# Patient Record
Sex: Female | Born: 1975 | Race: Black or African American | Hispanic: No | Marital: Single | Smoking: Current some day smoker
Health system: Southern US, Community
[De-identification: ages and names within clinical notes are randomized; demographics above are authoritative.]

## PROBLEM LIST (undated history)

## (undated) DIAGNOSIS — R87629 Unspecified abnormal cytological findings in specimens from vagina: Secondary | ICD-10-CM

## (undated) DIAGNOSIS — M3500 Sicca syndrome, unspecified: Secondary | ICD-10-CM

## (undated) DIAGNOSIS — N939 Abnormal uterine and vaginal bleeding, unspecified: Secondary | ICD-10-CM

## (undated) DIAGNOSIS — N390 Urinary tract infection, site not specified: Secondary | ICD-10-CM

## (undated) DIAGNOSIS — Z8619 Personal history of other infectious and parasitic diseases: Secondary | ICD-10-CM

## (undated) HISTORY — DX: Personal history of other infectious and parasitic diseases: Z86.19

## (undated) HISTORY — DX: Urinary tract infection, site not specified: N39.0

## (undated) HISTORY — DX: Unspecified abnormal cytological findings in specimens from vagina: R87.629

## (undated) HISTORY — PX: LEEP: SHX91

## (undated) HISTORY — DX: Sjogren syndrome, unspecified: M35.00

---

## 1997-12-04 ENCOUNTER — Inpatient Hospital Stay (HOSPITAL_COMMUNITY): Admission: AD | Admit: 1997-12-04 | Discharge: 1997-12-04 | Payer: Self-pay | Admitting: *Deleted

## 1997-12-06 ENCOUNTER — Inpatient Hospital Stay (HOSPITAL_COMMUNITY): Admission: AD | Admit: 1997-12-06 | Discharge: 1997-12-06 | Payer: Self-pay | Admitting: Obstetrics

## 1998-04-23 ENCOUNTER — Ambulatory Visit (HOSPITAL_COMMUNITY): Admission: RE | Admit: 1998-04-23 | Discharge: 1998-04-23 | Payer: Self-pay | Admitting: *Deleted

## 1998-06-24 ENCOUNTER — Inpatient Hospital Stay (HOSPITAL_COMMUNITY): Admission: AD | Admit: 1998-06-24 | Discharge: 1998-06-27 | Payer: Self-pay | Admitting: *Deleted

## 1999-04-22 ENCOUNTER — Emergency Department (HOSPITAL_COMMUNITY): Admission: EM | Admit: 1999-04-22 | Discharge: 1999-04-22 | Payer: Self-pay | Admitting: Emergency Medicine

## 2002-07-23 ENCOUNTER — Inpatient Hospital Stay (HOSPITAL_COMMUNITY): Admission: AD | Admit: 2002-07-23 | Discharge: 2002-07-23 | Payer: Self-pay | Admitting: *Deleted

## 2002-08-18 ENCOUNTER — Encounter: Payer: Self-pay | Admitting: *Deleted

## 2002-08-18 ENCOUNTER — Ambulatory Visit (HOSPITAL_COMMUNITY): Admission: RE | Admit: 2002-08-18 | Discharge: 2002-08-18 | Payer: Self-pay | Admitting: *Deleted

## 2002-08-24 ENCOUNTER — Ambulatory Visit (HOSPITAL_COMMUNITY): Admission: RE | Admit: 2002-08-24 | Discharge: 2002-08-24 | Payer: Self-pay | Admitting: *Deleted

## 2002-08-24 ENCOUNTER — Encounter: Payer: Self-pay | Admitting: *Deleted

## 2002-11-07 ENCOUNTER — Ambulatory Visit (HOSPITAL_COMMUNITY): Admission: RE | Admit: 2002-11-07 | Discharge: 2002-11-07 | Payer: Self-pay | Admitting: *Deleted

## 2002-11-28 ENCOUNTER — Ambulatory Visit (HOSPITAL_COMMUNITY): Admission: RE | Admit: 2002-11-28 | Discharge: 2002-11-28 | Payer: Self-pay | Admitting: *Deleted

## 2003-01-13 ENCOUNTER — Inpatient Hospital Stay (HOSPITAL_COMMUNITY): Admission: AD | Admit: 2003-01-13 | Discharge: 2003-01-16 | Payer: Self-pay | Admitting: Obstetrics and Gynecology

## 2005-08-04 ENCOUNTER — Inpatient Hospital Stay (HOSPITAL_COMMUNITY): Admission: AD | Admit: 2005-08-04 | Discharge: 2005-08-04 | Payer: Self-pay | Admitting: Family Medicine

## 2006-01-19 ENCOUNTER — Encounter: Payer: Self-pay | Admitting: Emergency Medicine

## 2006-01-19 ENCOUNTER — Ambulatory Visit (HOSPITAL_COMMUNITY): Admission: AD | Admit: 2006-01-19 | Discharge: 2006-01-19 | Payer: Self-pay | Admitting: Obstetrics and Gynecology

## 2006-01-19 ENCOUNTER — Encounter (INDEPENDENT_AMBULATORY_CARE_PROVIDER_SITE_OTHER): Payer: Self-pay | Admitting: *Deleted

## 2006-06-24 ENCOUNTER — Ambulatory Visit (HOSPITAL_COMMUNITY): Admission: RE | Admit: 2006-06-24 | Discharge: 2006-06-24 | Payer: Self-pay | Admitting: Family Medicine

## 2006-11-22 ENCOUNTER — Ambulatory Visit: Payer: Self-pay | Admitting: Family Medicine

## 2006-11-22 ENCOUNTER — Inpatient Hospital Stay (HOSPITAL_COMMUNITY): Admission: AD | Admit: 2006-11-22 | Discharge: 2006-11-24 | Payer: Self-pay | Admitting: Family Medicine

## 2006-11-22 ENCOUNTER — Encounter: Payer: Self-pay | Admitting: Family Medicine

## 2009-03-13 ENCOUNTER — Inpatient Hospital Stay (HOSPITAL_COMMUNITY): Admission: AD | Admit: 2009-03-13 | Discharge: 2009-03-13 | Payer: Self-pay | Admitting: Obstetrics & Gynecology

## 2009-03-13 ENCOUNTER — Encounter: Payer: Self-pay | Admitting: Emergency Medicine

## 2010-08-13 IMAGING — US US OB TRANSVAGINAL
1 series · 14 of 28 positions shown · non-contrast
Comparison: None

CLINICAL DATA: Pelvic pain and vaginal bleeding.

OBSTETRIC <14 WK US AND TRANSVAGINAL OB US
TECHNIQUE: Both transabdominal and transvaginal ultrasound
examinations were performed for complete evaluation of the
gestation as well as the maternal uterus, adnexal regions, and
pelvic cul-de-sac.

[Series 1: us ob transvaginal · 0.26mm/px · 14 of 37 slices shown]
[im 2/37]
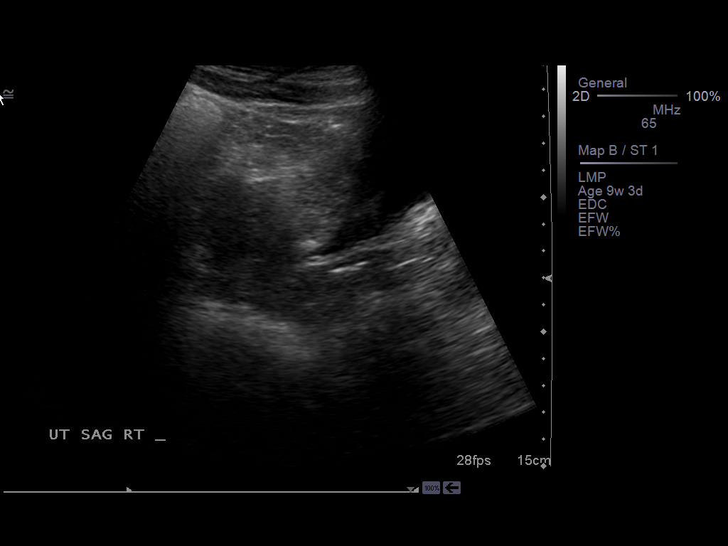
[im 5/37]
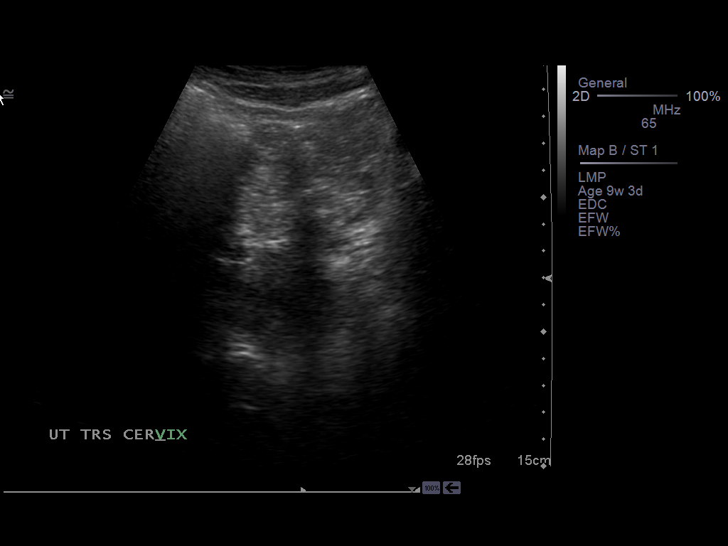
[im 7/37]
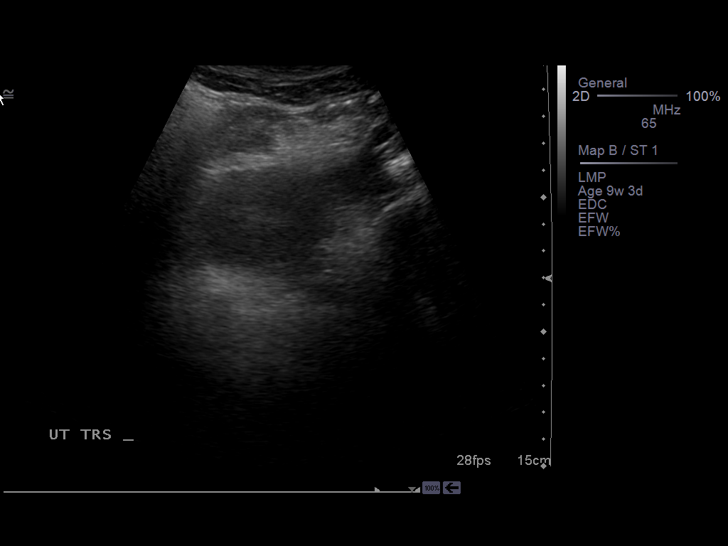
[im 10/37]
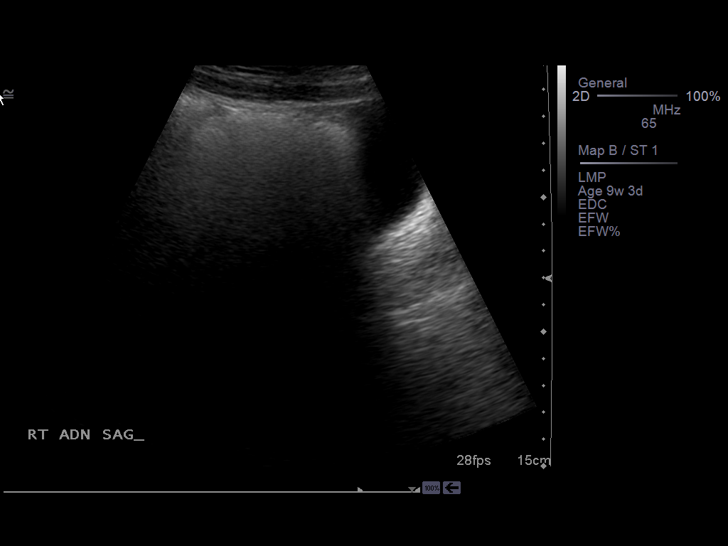
[im 13/37]
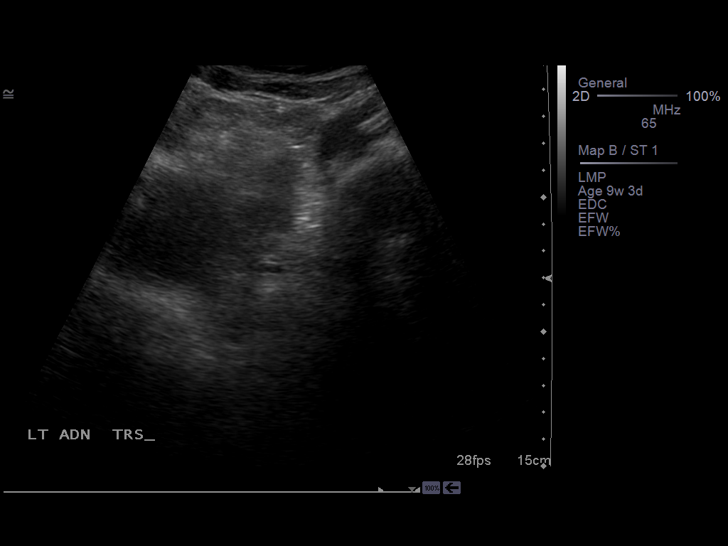
[im 15/37]
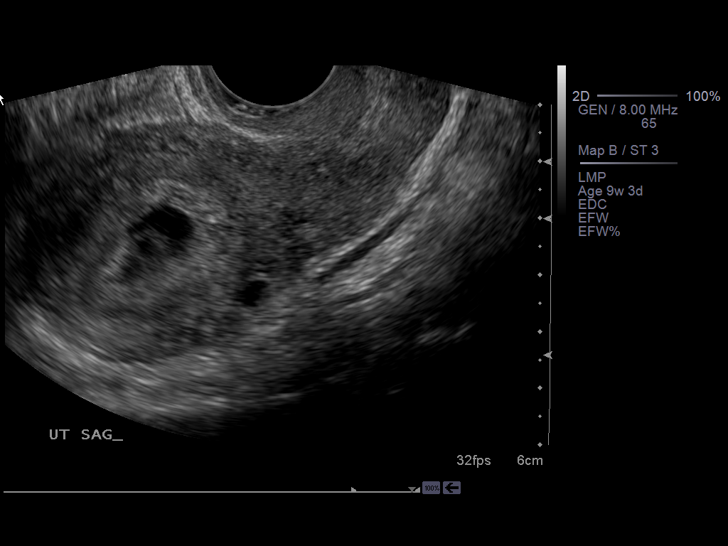
[im 18/37]
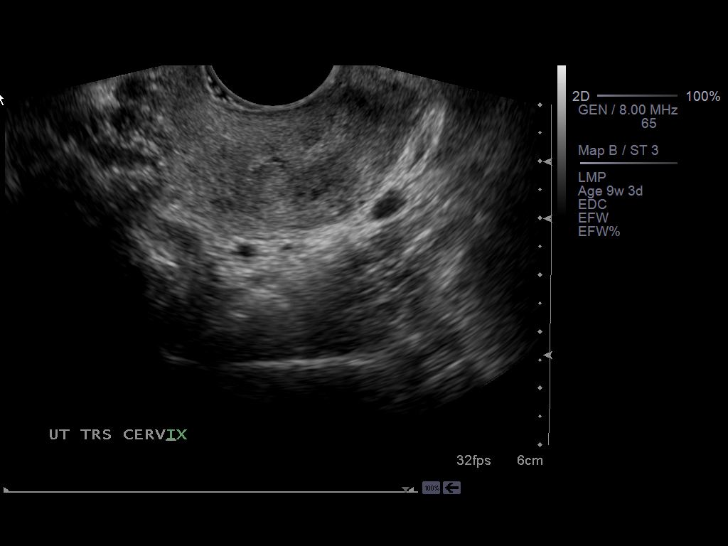
[im 21/37]
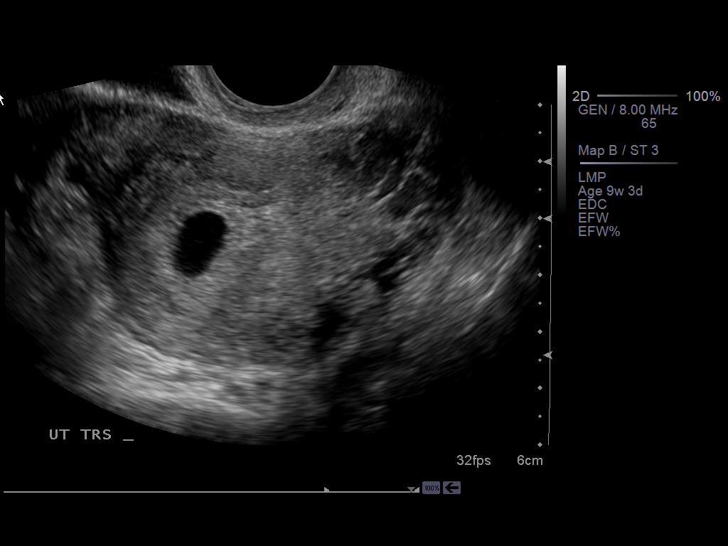
[im 23/37]
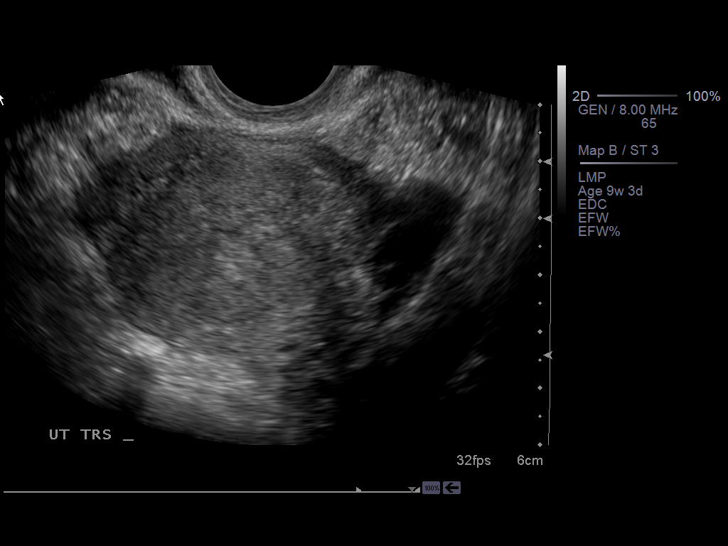
[im 26/37]
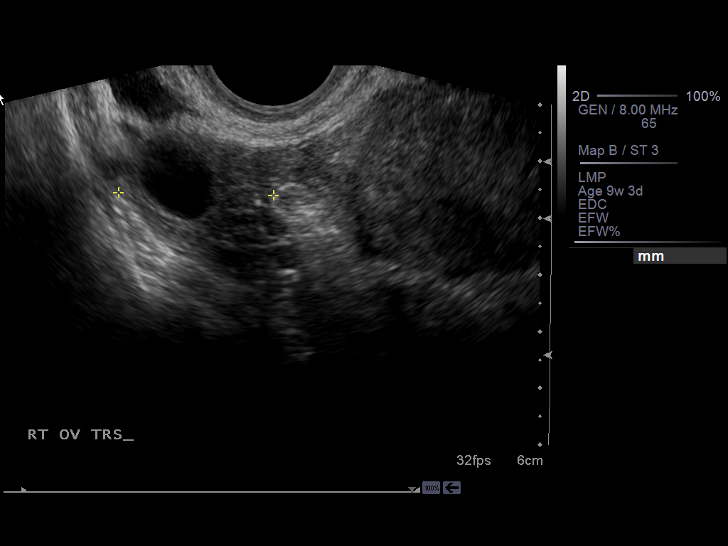
[im 29/37]
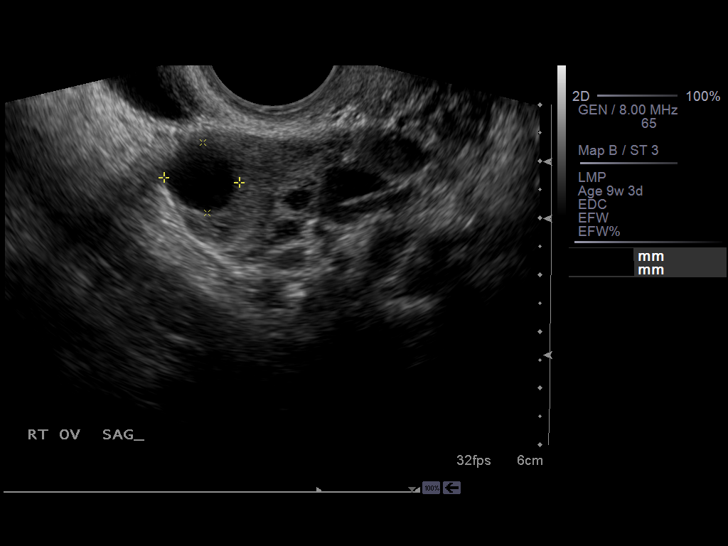
[im 31/37]
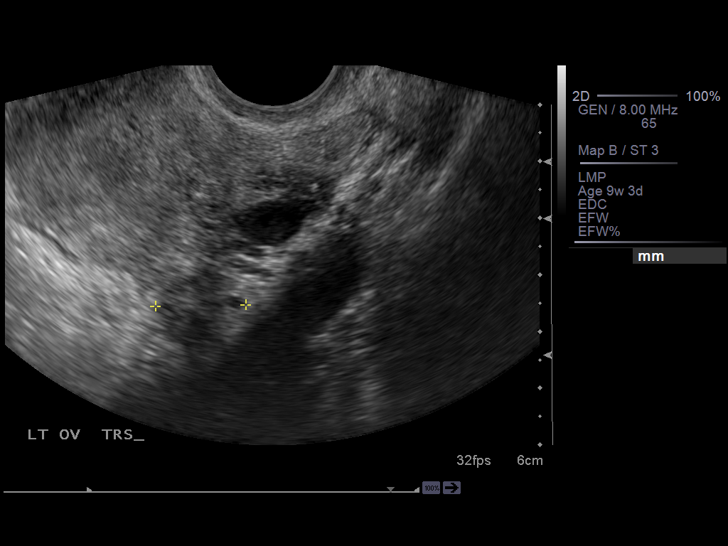
[im 34/37]
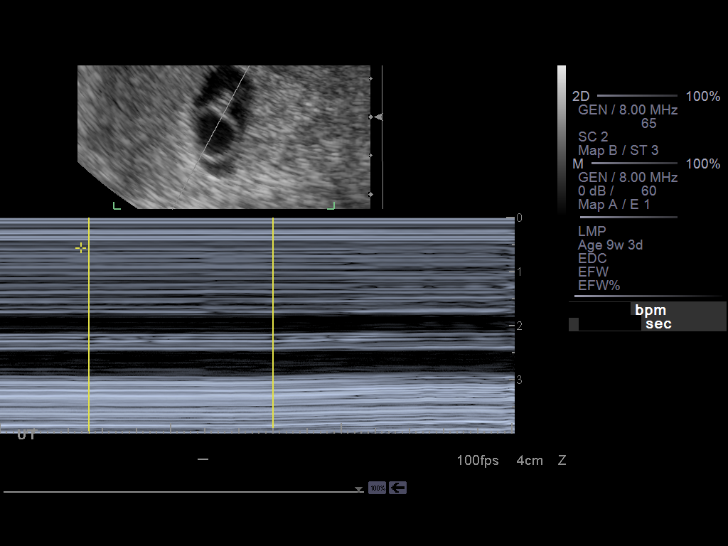
[im 37/37]
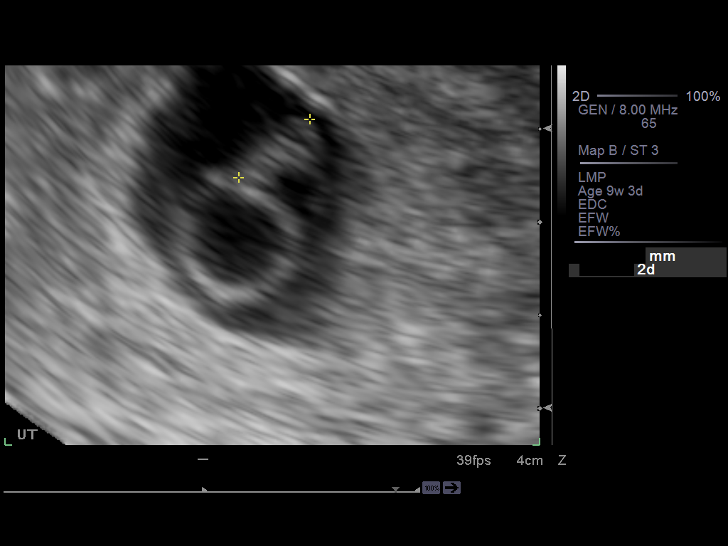

[14 of 28 positions shown; findings below may reference images not displayed]

Intrauterine gestational sac: Single
Yolk sac: Present
Embryo: Present
Cardiac Activity: Present
Heart Rate: 59 bpm

CRL: 4.9mm           6   w  2   d

Maternal uterus/adnexae:
No subchorionic hemorrhage.
Small cyst associated the right ovary.
Left ovary is normal.
No free pelvic fluid.
IMPRESSION: Intrauterine gestational sac with an embryo estimated at 6 weeks
and 2 days gestation.  Low heart rate is worrisome. Follow-up
quantitative beta HCG levels are suggested along with reultrasound
examination as indicated.

## 2010-10-11 LAB — GC/CHLAMYDIA PROBE AMP, GENITAL
Chlamydia, DNA Probe: NEGATIVE
GC Probe Amp, Genital: NEGATIVE

## 2010-10-11 LAB — URINALYSIS, ROUTINE W REFLEX MICROSCOPIC
Bilirubin Urine: NEGATIVE
Glucose, UA: NEGATIVE mg/dL
Ketones, ur: NEGATIVE mg/dL
Leukocytes, UA: NEGATIVE
Nitrite: NEGATIVE
Protein, ur: NEGATIVE mg/dL
Specific Gravity, Urine: 1.013 (ref 1.005–1.030)
Urobilinogen, UA: 0.2 mg/dL (ref 0.0–1.0)
pH: 7 (ref 5.0–8.0)

## 2010-10-11 LAB — URINE MICROSCOPIC-ADD ON

## 2010-10-11 LAB — SAMPLE TO BLOOD BANK

## 2010-10-11 LAB — HCG, QUANTITATIVE, PREGNANCY: hCG, Beta Chain, Quant, S: 21274 m[IU]/mL — ABNORMAL HIGH (ref <5)

## 2010-10-11 LAB — BASIC METABOLIC PANEL
BUN: 10 mg/dL (ref 6–23)
CO2: 27 mEq/L (ref 19–32)
Chloride: 106 mEq/L (ref 96–112)
Creatinine, Ser: 0.57 mg/dL (ref 0.4–1.2)
Glucose, Bld: 93 mg/dL (ref 70–99)

## 2010-10-11 LAB — POCT I-STAT, CHEM 8
Creatinine, Ser: 0.8 mg/dL (ref 0.4–1.2)
Glucose, Bld: 89 mg/dL (ref 70–99)
Hemoglobin: 12.9 g/dL (ref 12.0–15.0)
Potassium: 3.9 mEq/L (ref 3.5–5.1)

## 2010-10-11 LAB — CBC
MCHC: 34.1 g/dL (ref 30.0–36.0)
MCV: 95 fL (ref 78.0–100.0)
Platelets: 222 10*3/uL (ref 150–400)

## 2010-10-11 LAB — DIFFERENTIAL
Basophils Relative: 1 % (ref 0–1)
Eosinophils Absolute: 0.1 10*3/uL (ref 0.0–0.7)
Monocytes Relative: 12 % (ref 3–12)
Neutrophils Relative %: 40 % — ABNORMAL LOW (ref 43–77)

## 2010-10-11 LAB — WET PREP, GENITAL: Clue Cells Wet Prep HPF POC: NONE SEEN

## 2010-11-19 NOTE — Discharge Summary (Signed)
[name], [name]     ACCOUNT NO.:  [id number]   MEDICAL RECORD NO.:  [id number]          PATIENT TYPE:  INP   LOCATION:  9130                          FACILITY:  [facility]   PHYSICIAN:  Norton Blizzard, M.D.    DATE OF BIRTH:  1976/07/06   DATE OF ADMISSION:  11/22/2006  DATE OF DISCHARGE:  11/24/2006                               DISCHARGE SUMMARY   DISCHARGE DIAGNOSIS:  Status post primary low transverse C-section of a  viable female infant - C-section was for terminal fetal bradycardia for 5  minutes at 80 beats per minute then decreased down to the 50s.   CONSULTS:  None.   PROCEDURE:  1. Fetal lung stress test.  2. Ultrasound.  3. Epidural.  4. Primary low transverse C-section as noted above.   HOSPITAL COURSE:  The patient is a 35 year old, G5, P2, 0, 2, 2, who  presented in active labor at 40 plus 5 weeks.  She was augmented with  artifical rupture of membranes with clear fluid.  She had an epidural  for anesthesia.  At about 9:00 to 10:00 a.m. on the day of admission  fetal heart rate was noted to be decreasing down to 80s to 60s to 50s.  Noted to have fetal terminal bradycardia so was taken to the OR for an  emergent primary low transverse C-section.  Apgar's of infant were 9 and  10 with a cord pH of 7.15.  Estimated blood loss of the procedure is  1000 mL.  NICU team was present.   During the postpartum period the patient was noted to have some  sanguineous drainage from the middle part of her incision.  This was not  noted to be fluctuant as if there was a hematoma in this region.  She  had no active drainage with palpation around the area and the wound was  intact.  Her hemoglobin had decreased to 9.9 from 11.5 postop; however,  the bleeding was decreasing.  She had no dizziness.  It was felt that  she could be discharged with the staples in place to have the [facility]  nurse take them out at home.  She is breast and bottle feeding.  Noted  to be rh positive,  rubella immune, and GBS negative.  She desired  Micronor for contraception.  She did not want a circumcision for her  female infant.  She had no other complaints in the postpartum period and  it was felt that she could be discharged on postop day #2 for early  discharge.  She is to followup at the health department in 6 weeks for  her regular postpartum check.           ______________________________  Norton Blizzard, M.D.     SH/MEDQ  D:  11/24/2006  T:  11/24/2006  Job:  [id number]   cc:   Health Department

## 2010-11-19 NOTE — Op Note (Signed)
[name], [name]     ACCOUNT NO.:  [id number]   MEDICAL RECORD NO.:  [id number]          PATIENT TYPE:  INP   LOCATION:  9130                          [facility]ACILITY:  [facility]   PHYSICIAN:  Tanya S. Shawnie Pons, M.D.   DATE OF BIRTH:  11-02-1975   DATE OF PROCEDURE:  11/22/2006  DATE OF DISCHARGE:                               OPERATIVE REPORT   PREOPERATIVE DIAGNOSIS:  Fetal bradycardia.   POSTOPERATIVE DIAGNOSIS:  Fetal bradycardia.   PROCEDURE:  Emergent primary low transverse C-section.   SURGEON:  Shelbie Proctor. Shawnie Pons, M.D.   ANESTHESIA:  Epidural, Casimiro Needle A. Malen Gauze, M.D.   FINDINGS:  Viable female infant, Apgars 9 and 10, weight 9 pounds 5  ounces.  Infant in an ROP to ROT position.   ESTIMATED BLOOD LOSS:  1000 cubic centimeters.   COMPLICATIONS:  None.   SPECIMENS:  Placenta to pathology, cord pH 7.15.   REASON FOR PROCEDURE:  Briefly, the patient is a 35 year old, gravida 5  para 2-0-2-2, who had two previous vaginal deliveries and came in at 40  and 5 at 7 cm, complete, __________ , with a bulging bag of membranes.  The patient continued to labor through the afternoon and finally became  complete.  At that point, she began pushing and was noted to have a  fairly extensive deceleration and I was called to room.  Upon entering  the room, the patient was noted to have fetal bradycardia in the 80s for  approximately five minutes.  She was examined, felt to be complete, ROP  to ROT, 0 station.  We attempted to push x 3 and to even rotate the baby  with no effective expulsive effort.  At this point, the fetal heart rate  was in the 60s and going down to the 50s and had been down for  approximately 12 minutes.  At that point, emergent C-section was called.   DESCRIPTION OF PROCEDURE:  The patient was taken to the OR where she was  placed in the supine position.  Betadine was put on her abdomen and the  drapes placed.  Anesthesia was tested with an Allis clamp and she was  felt  to have adequate anesthesia, so a knife was used to make a  Pfannenstiel incision in the skin.  This was carried down to underlying  fascia.  The fascia was divided bluntly laterally.  The rectus was  divided in the midline, the peritoneal cavity entered bluntly.  A knife  was used to make a low transverse incision on the uterus until the  amniotic cavity was entered.  The infant was ROP and head was brought up  through the incision and delivered easily.  The cord was clamped x 2 and  cut, and the infant was taken to the awaiting pediatrician.  Cord pH and  cord blood were obtained.  The placenta was manually removed from the  uterus, the uterine cavity cleaned with a dry lap pad.  The edges of the  uterine incision was grasped with ring forceps.  The uterine incision  was closed with a 0-Vicryl suture in a locked running fashion.  A second  imbricating layer  of 0-Vicryl was used to achieve hemostasis.  There was  some bleeding around the bladder.  This was cauterized with  electrocautery.  The rectus was inspected and found to be hemostatic.  The fascia was closed with a 0-Vicryl suture in a running fashion.  The  subcutaneous tissue was irrigated and any  bleeding was cauterized with electrocautery.  The skin was closed using  clips.  A pressure dressing was applied.  The patient was awakened and  taken to the recovery room in stable condition.  All instrument and lap  counts were correct x 2.           ______________________________  Shelbie Proctor. Shawnie Pons, M.D.     TSP/MEDQ  D:  11/22/2006  T:  11/23/2006  Job:  [id number]

## 2010-11-22 NOTE — Op Note (Signed)
[name], [name] NO.:  [id number]   MEDICAL RECORD NO.:  [id number]          PATIENT TYPE:  AMB   LOCATION:  SDC                           [facility]ACILITY:  [facility]   PHYSICIAN:  Malva Limes, M.D.    DATE OF BIRTH:  [birth year]   DATE OF PROCEDURE:  01/19/2006  DATE OF DISCHARGE:                                 OPERATIVE REPORT   PREOPERATIVE DIAGNOSIS:  Incomplete abortion.   POSTOPERATIVE DIAGNOSIS:  Incomplete abortion.   PROCEDURE:  Dilation, evacuation.   SURGEON:  Malva Limes, M.D.   ANESTHESIA:  MAC with paracervical block.   DRAINS:  None.   ANTIBIOTICS:  Ancef 1 gram.   SPECIMENS:  Products of conception sent to pathology.   COMPLICATIONS:  None.   ESTIMATED BLOOD LOSS:  Minimal.   PROCEDURE:  The patient was taken to the operating room where she was placed  in the dorsal lithotomy position.  The patient was given a MAC anesthesia.  She was prepped with Betadine and a sterile speculum was placed in the  vagina.  20 cubic centimeters of 1% lidocaine was placed in the cervix.  A  single-tooth tenaculum was applied to the anterior cervical lip.  The  patient had obvious products of conception in the dilator os.  This was  removed with a ring forceps.  No dilation was required for the suction  curettage.  A 9 mm suction curette was placed into the uterine cavity, and a  minimal amount of tissue remained.  Sharp curettage was performed followed  by repeat suction.  The patient was taken to the recovery room in stable  condition.  She will be discharged to home.  She will be instructed tofollow  up with the teaching service who is her physician.  She will be given Keflex  5 mg four times a day for 2 days and Anaprox double strength p.r.n.  She  will be given RhoGAM prior to discharge if her blood type is Rh negative.           ______________________________  Malva Limes, M.D.     MA/MEDQ  D:  01/19/2006  T:  01/20/2006  Job:  [id number]

## 2011-08-14 ENCOUNTER — Encounter (INDEPENDENT_AMBULATORY_CARE_PROVIDER_SITE_OTHER): Payer: Managed Care, Other (non HMO) | Admitting: Physician Assistant

## 2011-08-26 ENCOUNTER — Other Ambulatory Visit: Payer: Self-pay | Admitting: *Deleted

## 2011-08-26 MED ORDER — NORGESTIMATE-ETH ESTRADIOL 0.25-35 MG-MCG PO TABS: 1.0000 | ORAL_TABLET | Freq: Every day | ORAL | Status: DC

## 2011-11-26 ENCOUNTER — Ambulatory Visit (INDEPENDENT_AMBULATORY_CARE_PROVIDER_SITE_OTHER): Payer: Managed Care, Other (non HMO) | Admitting: Family Medicine

## 2011-11-26 VITALS — BP 118/58 | HR 88 | Temp 98.3°F | Resp 16 | Ht 62.0 in | Wt 135.8 lb

## 2011-11-26 DIAGNOSIS — Z0000 Encounter for general adult medical examination without abnormal findings: Secondary | ICD-10-CM

## 2011-11-26 DIAGNOSIS — IMO0001 Reserved for inherently not codable concepts without codable children: Secondary | ICD-10-CM

## 2011-11-26 MED ORDER — NORGESTIMATE-ETH ESTRADIOL 0.25-35 MG-MCG PO TABS: ORAL_TABLET | ORAL | Status: DC

## 2011-11-26 NOTE — Progress Notes (Signed)
Subjective:    Patient ID: [name], female    DOB: [birth year], 36 y.o.   MRN: [id number]  HPI Presents for annual CPE  Last menses 5/5 Married, monogomous 3 children  Yearly paps X 10 all normal   Review of Systems     Objective:   Physical Exam  Constitutional: She appears well-developed.  HENT:  Head: Normocephalic and atraumatic.  Right Ear: External ear normal.  Left Ear: External ear normal.  Nose: Nose normal.  Mouth/Throat: Oropharynx is clear and moist.  Eyes: EOM are normal. Pupils are equal, round, and reactive to light.  Neck: Normal range of motion. Neck supple.  Cardiovascular: Normal rate, regular rhythm and normal heart sounds.   Pulmonary/Chest: Effort normal and breath sounds normal. Right breast exhibits no mass, no nipple discharge and no skin change. Left breast exhibits no mass, no nipple discharge and no skin change.  Abdominal: Soft. Bowel sounds are normal.       No HSM  Musculoskeletal: Normal range of motion.  Neurological: She is alert. She has normal reflexes.  Skin: Skin is warm and dry.  Psychiatric: She has a normal mood and affect.       Assessment & Plan:  CPE      -Birth control  Given yearly paps all normal and current guidelines patient does not need a pap smear today Will check uriprobe and u preg Refill OCP X 1 year Antiicipatory guidance

## 2011-11-27 LAB — GC/CHLAMYDIA PROBE AMP, URINE: GC Probe Amp, Urine: NEGATIVE

## 2011-12-01 ENCOUNTER — Encounter: Payer: Self-pay | Admitting: *Deleted

## 2012-03-11 ENCOUNTER — Ambulatory Visit (INDEPENDENT_AMBULATORY_CARE_PROVIDER_SITE_OTHER): Payer: Managed Care, Other (non HMO) | Admitting: Physician Assistant

## 2012-03-11 VITALS — BP 110/68 | HR 106 | Temp 98.2°F | Resp 12 | Ht 62.0 in | Wt 139.6 lb

## 2012-03-11 DIAGNOSIS — R300 Dysuria: Secondary | ICD-10-CM

## 2012-03-11 DIAGNOSIS — B373 Candidiasis of vulva and vagina: Secondary | ICD-10-CM

## 2012-03-11 DIAGNOSIS — N898 Other specified noninflammatory disorders of vagina: Secondary | ICD-10-CM

## 2012-03-11 DIAGNOSIS — N760 Acute vaginitis: Secondary | ICD-10-CM

## 2012-03-11 LAB — POCT URINALYSIS DIPSTICK
Protein, UA: NEGATIVE
Spec Grav, UA: 1.02
Urobilinogen, UA: 1

## 2012-03-11 LAB — POCT WET PREP WITH KOH: KOH Prep POC: POSITIVE

## 2012-03-11 LAB — POCT UA - MICROSCOPIC ONLY
Casts, Ur, LPF, POC: NEGATIVE
Yeast, UA: NEGATIVE

## 2012-03-11 MED ORDER — AZITHROMYCIN 250 MG PO TABS: ORAL_TABLET | ORAL | Status: AC

## 2012-03-11 MED ORDER — METRONIDAZOLE 500 MG PO TABS: 500.0000 mg | ORAL_TABLET | Freq: Two times a day (BID) | ORAL | Status: AC

## 2012-03-11 MED ORDER — CEFIXIME 400 MG PO TABS: ORAL_TABLET | ORAL | Status: DC

## 2012-03-11 MED ORDER — FLUCONAZOLE 150 MG PO TABS: 150.0000 mg | ORAL_TABLET | Freq: Once | ORAL | Status: AC

## 2012-03-11 NOTE — Progress Notes (Signed)
Subjective:    Patient ID: [name], female    DOB: February 12, 1976, 36 y.o.   MRN: [id number]  HPI 36 yr old female presents with vaginal itching and dysuria X 2days.  She denies pelvic pain or abdominal pain.  No f/c.  She did travel to the beach recently and had sex with her partner frequently. She hasn't noticed vaginal discharge, but she has noticed a fishy odor.  She is up to date on her pap smear.  Review of Systems  All other systems reviewed and are negative.       Objective:   Physical Exam  Nursing note and vitals reviewed. Constitutional: She is oriented to person, place, and time. She appears well-developed and well-nourished.  Cardiovascular: Normal rate, regular rhythm and normal heart sounds.   Pulmonary/Chest: Effort normal and breath sounds normal.  Genitourinary:       Patient did a self-wetprep  Neurological: She is alert and oriented to person, place, and time.  Skin: Skin is warm and dry.  Psychiatric: She has a normal mood and affect. Her behavior is normal.      Results for orders placed in visit on 03/11/12  POCT UA - MICROSCOPIC ONLY      Component Value Range   WBC, Ur, HPF, POC 10-15     RBC, urine, microscopic 2-5     Bacteria, U Microscopic 1+     Mucus, UA trace     Epithelial cells, urine per micros 3-6     Crystals, Ur, HPF, POC neg     Casts, Ur, LPF, POC neg     Yeast, UA neg     Amorphous 2+    POCT URINALYSIS DIPSTICK      Component Value Range   Color, UA orange     Clarity, UA cloudy     Glucose, UA neg     Bilirubin, UA neg     Ketones, UA neg     Spec Grav, UA 1.020     Blood, UA trace     pH, UA 7.0     Protein, UA neg     Urobilinogen, UA 1.0     Nitrite, UA pos     Leukocytes, UA large (3+)    POCT WET PREP WITH KOH      Component Value Range   Trichomonas, UA Negative     Clue Cells Wet Prep HPF POC 2-4     Epithelial Wet Prep HPF POC 1-3     Yeast Wet Prep HPF POC positive     Bacteria Wet Prep HPF  POC 2+     RBC Wet Prep HPF POC 0-2     WBC Wet Prep HPF POC 8-12     KOH Prep POC Positive        Assessment & Plan:  BV Yeast vaginitis Cystitis-increase water intake.  Start prescriptions as directed. May need additional antibiotics for UTI, but we can await culture since she has to start all of these others now.

## 2012-03-12 NOTE — Progress Notes (Signed)
This encounter was created in error - please disregard.

## 2012-03-13 LAB — GC/CHLAMYDIA PROBE AMP, URINE: Chlamydia, Swab/Urine, PCR: NEGATIVE

## 2012-03-14 LAB — URINE CULTURE: Colony Count: 90000

## 2014-09-25 ENCOUNTER — Encounter (HOSPITAL_COMMUNITY): Payer: Self-pay | Admitting: *Deleted

## 2014-10-05 ENCOUNTER — Encounter (HOSPITAL_COMMUNITY): Payer: Self-pay

## 2014-10-05 ENCOUNTER — Ambulatory Visit (HOSPITAL_COMMUNITY)
Admission: RE | Admit: 2014-10-05 | Discharge: 2014-10-05 | Disposition: A | Discharge status: Home / Self Care | Payer: Self-pay | Source: Ambulatory Visit | Attending: Obstetrics and Gynecology | Admitting: Obstetrics and Gynecology

## 2014-10-05 VITALS — BP 112/76 | Temp 98.1°F | Ht 62.0 in | Wt 149.4 lb

## 2014-10-05 DIAGNOSIS — Z1239 Encounter for other screening for malignant neoplasm of breast: Secondary | ICD-10-CM

## 2014-10-05 DIAGNOSIS — R87610 Atypical squamous cells of undetermined significance on cytologic smear of cervix (ASC-US): Secondary | ICD-10-CM

## 2014-10-05 DIAGNOSIS — R87810 Cervical high risk human papillomavirus (HPV) DNA test positive: Secondary | ICD-10-CM

## 2014-10-05 HISTORY — DX: Abnormal uterine and vaginal bleeding, unspecified: N93.9

## 2014-10-05 NOTE — Patient Instructions (Addendum)
Educational materials on self-breast awareness given. Explained to [name] the needed follow-up for her abnormal Pap smear. [facility] to follow-up for abnormal Pap smear. Appointment scheduled for Thursday, October 26, 2014 at 1415. Patient aware of appointment and will be there. Screeening mammogram recommended at age 39 unless clinically indicated prior. [name] verbalized understanding.  [place]annock, Kathaleen Maserhristine Poll, RN 2:55 PM

## 2014-10-05 NOTE — Progress Notes (Signed)
[facility] due to having an abnormal Pap smear 09/06/2014 that a colposcopy is recommended for follow-up. Patient complained of occasional left breast pain in the nipple area that occurs once a month around her menstrual cycle.  Pap Smear:  Pap smear not completed today. [facility] and ASCUS HPV+. [facility] for colposcopy to follow-up for abnormal Pap smear. Appointment scheduled for Thursday, October 26, 2014 at 1415.  Per patient her most recent Pap smear is the only abnormal Pap smear she has had. Last Pap smear is scanned into EPIC under media.  Physical exam: Breasts Breasts symmetrical. No skin abnormalities bilateral breasts. No nipple retraction bilateral breasts. No nipple discharge bilateral breasts. No lymphadenopathy. No lumps palpated bilateral breasts. Complaints of tenderness above left nipple when palpated. Screening mammogram recommended at age 39 unless clinically indicated prior.     Pelvic/Bimanual No Pap smear completed today since last Pap smear was 09/06/2014. Pap smear not indicated per [facility] guidelines.   Interpreter Nira ConnJulia Sowell used.

## 2014-10-26 ENCOUNTER — Encounter: Payer: Self-pay | Admitting: Obstetrics & Gynecology

## 2014-10-26 ENCOUNTER — Ambulatory Visit (INDEPENDENT_AMBULATORY_CARE_PROVIDER_SITE_OTHER): Payer: Self-pay | Admitting: Obstetrics & Gynecology

## 2014-10-26 ENCOUNTER — Other Ambulatory Visit (HOSPITAL_COMMUNITY)
Admission: RE | Admit: 2014-10-26 | Discharge: 2014-10-26 | Disposition: A | Discharge status: Home / Self Care | Payer: Self-pay | Source: Ambulatory Visit | Attending: Obstetrics & Gynecology | Admitting: Obstetrics & Gynecology

## 2014-10-26 VITALS — BP 114/81 | HR 74 | Temp 98.3°F | Ht 61.0 in | Wt 149.5 lb

## 2014-10-26 DIAGNOSIS — IMO0002 Reserved for concepts with insufficient information to code with codable children: Secondary | ICD-10-CM

## 2014-10-26 DIAGNOSIS — Z3202 Encounter for pregnancy test, result negative: Secondary | ICD-10-CM

## 2014-10-26 DIAGNOSIS — R896 Abnormal cytological findings in specimens from other organs, systems and tissues: Secondary | ICD-10-CM

## 2014-10-26 LAB — POCT PREGNANCY, URINE: Preg Test, Ur: NEGATIVE

## 2014-10-26 NOTE — Patient Instructions (Signed)
Colposcopa, Cuidado posterior (Colposcopy, Care After) La colposcopa es un procedimiento en el que se utiliza una herramienta especial para magnificar la superficie del cuello del tero. Tambin es posible que se tome una muestra de tejido (biopsia). Esta muestra se observar para identificar la presencia de cncer cervical u otros problemas. Despus del procedimiento:  Podr sentir algunos clicos.  Recustese algunos minutos si se siente mareada.  Podr tener un sangrado que debera detenerse luego de algunos das. CUIDADOS EN EL HOGAR  No tenga relaciones sexuales ni use tampones durante 2 o 3 das o segn le hayan indicado.  Slo tome medicamentos como lo indique su mdico.  Contine tomando las pastillas anticonceptivas de la forma habitual. Averige los resultados de su anlisis Pregunte cundo estarn listos los resultados del examen. Asegrese de obtener los resultados. SOLICITE AYUDA DE INMEDIATO SI:  Tiene un sangrado abundante o elimina cogulos.  Su temperatura es de 102 F (38.9 C) o mayor.  Observa una secrecin vaginal anormal.  Tiene clicos que no se van con los medicamentos.  Siente mareos, vrtigo o pierde el conocimiento (se desmaya). ASEGRESE DE QUE:   Comprende estas instrucciones.  Controlar su enfermedad.  Solicitar ayuda de inmediato si no mejora o si empeora. Document Released: 07/26/2010 Document Revised: 09/15/2011 ExitCare Patient Information 2015 [employer]. This information is not intended to replace advice given to you by your health care provider. Make sure you discuss any questions you have with your health care provider.  

## 2014-10-26 NOTE — Progress Notes (Signed)
Spanish interpreter Dian QueenBlanca Lindher

## 2014-10-26 NOTE — Progress Notes (Signed)
Patient ID: [name], female   DOB: June 29, 1976, 39 y.o.   MRN: [id number]  Chief Complaint  Patient presents with  . Procedure    colposcopy    HPI [name] is a 39 y.o. female.  [id number] Patient's last menstrual period was 08/24/2014. On OCP  HPI  Indications: Pap smear on February 2016 showed: ASCUS with POSITIVE high risk HPV. Previous colposcopy: no. Prior cervical treatment: n/a.  Past Medical History  Diagnosis Date  . Abnormal uterine bleeding     Past Surgical History  Procedure Laterality Date  . Cesarean section      Family History  Problem Relation Age of Onset  . Diabetes Mother   . Hypertension Mother   . Breast cancer Paternal Grandmother     Social History History  Substance Use Topics  . Smoking status: Never Smoker   . Smokeless tobacco: Not on file  . Alcohol Use: 1.2 oz/week    2 Cans of beer per week    No Known Allergies  Current Outpatient Prescriptions  Medication Sig Dispense Refill  . cefixime (SUPRAX) 400 MG tablet Take 1 tablet tonight (Patient not taking: Reported on 10/05/2014) 1 tablet 0  . norgestimate-ethinyl estradiol (ORTHO-CYCLEN,SPRINTEC,PREVIFEM) 0.25-35 MG-MCG tablet 1 po daily (Patient not taking: Reported on 10/05/2014) 1 Package 11   No current facility-administered medications for this visit.    Review of Systems Review of Systems  Genitourinary: Negative for vaginal bleeding, vaginal discharge, menstrual problem and pelvic pain.    Blood pressure 114/81, pulse 74, temperature 98.3 F (36.8 C), temperature source Oral, height 5\' 1"  (1.549 m), weight 149 lb 8 oz (67.813 kg), last menstrual period 08/24/2014.  Physical Exam Physical Exam  Constitutional: She is oriented to person, place, and time. She appears well-developed. No distress.  Pulmonary/Chest: Effort normal. No respiratory distress.  Genitourinary: Vagina normal. No vaginal discharge found.  Neurological: She is alert and  oriented to person, place, and time.  Skin: Skin is warm and dry.  Psychiatric: She has a normal mood and affect. Her behavior is normal.    Data Reviewed Pap report  Assessment    Procedure Details  The risks and benefits of the procedure and Written informed consent obtained.  Speculum placed in vagina and excellent visualization of cervix achieved, cervix swabbed x 3 with acetic acid solution. No lesion, SCJ seen Specimens: ECC, Bx 900  Complications: none.     Plan    Specimens labelled and sent to Pathology. Return to discuss Pathology results in 2 weeks.      Kardell Virgil 10/26/2014, 3:13 PM

## 2014-12-06 DIAGNOSIS — N390 Urinary tract infection, site not specified: Secondary | ICD-10-CM

## 2014-12-06 HISTORY — DX: Urinary tract infection, site not specified: N39.0

## 2014-12-07 ENCOUNTER — Ambulatory Visit (INDEPENDENT_AMBULATORY_CARE_PROVIDER_SITE_OTHER): Payer: Self-pay | Admitting: Obstetrics & Gynecology

## 2014-12-07 ENCOUNTER — Encounter: Payer: Self-pay | Admitting: Obstetrics & Gynecology

## 2014-12-07 VITALS — BP 117/77 | HR 72 | Ht 62.0 in | Wt 148.4 lb

## 2014-12-07 DIAGNOSIS — N871 Moderate cervical dysplasia: Secondary | ICD-10-CM

## 2014-12-07 LAB — POCT URINALYSIS DIP (DEVICE)
BILIRUBIN URINE: NEGATIVE
GLUCOSE, UA: NEGATIVE mg/dL
HGB URINE DIPSTICK: NEGATIVE
KETONES UR: NEGATIVE mg/dL
LEUKOCYTES UA: NEGATIVE
Nitrite: NEGATIVE
PH: 7 (ref 5.0–8.0)
Protein, ur: NEGATIVE mg/dL
SPECIFIC GRAVITY, URINE: 1.015 (ref 1.005–1.030)
Urobilinogen, UA: 0.2 mg/dL (ref 0.0–1.0)

## 2014-12-07 NOTE — Progress Notes (Signed)
Here for results colposcopy. Used interpreter Darel HongMariaElena Jimenez

## 2014-12-07 NOTE — Patient Instructions (Signed)
Procedimiento de escisin electroquirrgica con asa (Loop Electrosurgical Excision Procedure) El procedimiento de escisin electroquirrgica con asa es la extirpacin de una porcin de la parte inferior del tero (cuello). El procedimento se realiza cuando hay cambios significativamente anormales en las clulas del cuello del tero. Estos cambios pueden causar cncer si se dejan sin tratar.   El procedimiento mismo slo demora algunos minutos. Generalmente se realiza en el consultorio del mdico. Se considera un procedimiento seguro para aquellas mujeres que desean o tratan de quedar embarazadas. Solo bajo raras circunstancias este procedimiento se realiza estando embarazada.  INFORME A SU MDICO:   Si est embarazada o no tuvo el ltimo perodo menstrual.  Alergias a alimentos o medicamentos.  Todos los medicamentos que utiliza, incluyendo vitaminas, hierbas, gotas oftlmicas, medicamentos de venta libre y cremas.  Uso de corticoides (por va oral o cremas).  Problemas anteriores debido a anestsicos o a medicamentos que disminuyen la sensibilidad.  Cirugas ginecolgicas previas.  Antecedentes de hemorragias o cogulos sanguneos.  Infecciones recientes o actuales en la vagina (herpes, enfermedades de transmisin sexual).  Otros problemas de salud. RIESGOS Y COMPLICACIONES  Sangrado.  Infecciones.  Lesiones en la vagina, la vejiga o el recto.  Obstruccin rara en la abertura del cuello que causa problemas durante la menstruacin (estenosis cervical). ANTES DEL PROCEDIMIENTO   No tome aspirina ni anticoagulantes durante la semana previa al procedimiento, o segn le hayan indicado.  Consuma una comida ligera antes del procedimiento.  Consulte a su mdico si debe cambiar o suspender los medicamentos que toma habitualmente.  Le administrarn un analgsico 1  2 horas antes del procedimiento. PROCEDIMIENTO   Se coloca un instrumento (espculo) en la vagina. Esto le permite  al mdico observar el cuello.  Se aplica tintura de yodo para encontrar la zona de las clulas anormales.  Se inyecta un medicamento para adormecer el cuello (anestsico local).   Se pasa electricidad a travs de una delgada asa de alambre que luego se usa para extirpar (cauterizar) un pequeo segmento de la zona afectada.  Se utiliza una ligera electrocauterizacin para sellar los vasos sanguneos pequeos y evitar el sangrado.  Aplicarn una pasta en la zona cauterizada para prevenir el sangrado.  La muestra de tejido se enva al laboratorio. Luego, se examina en el microscopio. DESPUS DEL PROCEDIMIENTO   Pdale a alguna persona que la lleve hasta su casa.  Puede sentir un clico moderado a suave.  Puede notar una secrecin vaginal negra por la pasta usada para prevenir el sangrado. Esto es normal.  Observe si tiene un sangrado excesivo. Esto requiere atencin mdica inmediata.  Consulte con su mdico la fecha en que los resultados estarn disponibles. Asegrese de obtener los resultados. Document Released: 03/05/2011 Document Revised: 09/15/2011 ExitCare Patient Information 2015 [employer]. This information is not intended to replace advice given to you by your health care provider. Make sure you discuss any questions you have with your health care provider.  

## 2014-12-07 NOTE — Progress Notes (Signed)
Patient ID: [name], female   DOB: [birth year], 39 y.o.   MRN: [id number] Chief Complaint  Patient presents with  . 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 female. [id number] Patient's last menstrual period was Patient's last menstrual period was 12/03/2014.  On OCP  HPI  Indications: Pap smear on February 2016 showed: ASCUS with POSITIVE high risk HPV. Previous colposcopy: no. Prior cervical treatment: n/a.  Past Medical History  Diagnosis Date  . Abnormal uterine bleeding     Past Surgical History  Procedure Laterality Date  . Cesarean section      Family History  Problem Relation Age of Onset  . Diabetes Mother   . Hypertension Mother   . Breast cancer Paternal Grandmother     Social History History  Substance Use Topics  . Smoking status: Never Smoker   . Smokeless tobacco: Not on file  . Alcohol Use: 1.2 oz/week    2 Cans of beer per week    No Known Allergies  Current Outpatient Prescriptions  Medication Sig Dispense Refill  . cefixime (SUPRAX) 400 MG tablet Take 1 tablet tonight (Patient not taking: Reported on 10/05/2014) 1 tablet 0  . norgestimate-ethinyl estradiol (ORTHO-CYCLEN,SPRINTEC,PREVIFEM) 0.25-35 MG-MCG tablet 1 po daily (Patient not taking: Reported on 10/05/2014) 1 Package 11   No current facility-administered medications for this visit.    Review of Systems Review of Systems  Genitourinary: Negative for vaginal bleeding, vaginal discharge, menstrual problem and pelvic pain.   Blood pressure 117/77, pulse 72, height 5\' 2"  (1.575 m), weight 148 lb 6.4 oz (67.314 kg), last menstrual period 12/03/2014.    Physical Exam Physical Exam  Constitutional: She is oriented to person, place, and time. She appears well-developed. No distress.  Pulmonary/Chest:  Effort normal. No respiratory distress.  Genitourinary: Vagina normal. No vaginal discharge found.  Neurological: She is alert and oriented to person, place, and time.  Skin: Skin is warm and dry.  Psychiatric: She has a normal mood and affect. Her behavior is normal.    Data Reviewed Pap report  Assessment    Procedure Details  The risks and benefits of the procedure and Written informed consent obtained.  Speculum placed in vagina and excellent visualization of cervix achieved, cervix swabbed x 3 with acetic acid solution. No lesion, SCJ seen Specimens: ECC, Bx 900  Complications: none.     Plan    See note above, LEEP offered and explained, viewed video, will RTC for procedure      ARNOLD,JAMES 12/07/2014

## 2014-12-08 ENCOUNTER — Encounter: Payer: Self-pay | Admitting: Obstetrics & Gynecology

## 2015-01-03 ENCOUNTER — Encounter: Payer: Self-pay | Admitting: Obstetrics & Gynecology

## 2015-01-03 ENCOUNTER — Ambulatory Visit (INDEPENDENT_AMBULATORY_CARE_PROVIDER_SITE_OTHER): Payer: Self-pay | Admitting: Obstetrics & Gynecology

## 2015-01-03 ENCOUNTER — Other Ambulatory Visit (HOSPITAL_COMMUNITY)
Admission: RE | Admit: 2015-01-03 | Discharge: 2015-01-03 | Disposition: A | Discharge status: Home / Self Care | Payer: Self-pay | Source: Ambulatory Visit | Attending: Obstetrics & Gynecology | Admitting: Obstetrics & Gynecology

## 2015-01-03 VITALS — BP 119/84 | HR 76 | Temp 98.1°F | Ht 62.0 in | Wt 148.1 lb

## 2015-01-03 DIAGNOSIS — N871 Moderate cervical dysplasia: Secondary | ICD-10-CM

## 2015-01-03 DIAGNOSIS — Z3202 Encounter for pregnancy test, result negative: Secondary | ICD-10-CM

## 2015-01-03 LAB — POCT PREGNANCY, URINE: Preg Test, Ur: NEGATIVE

## 2015-01-03 NOTE — Progress Notes (Signed)
[id number] Patient's last menstrual period was 12/31/2014. CIN 2 cervical biopsy for LEEP today. She saw video, her questions were answered and interpreter was present.   Patient identified, informed consent obtained, signed copy in chart, time out performed.  Pap smear and colposcopy reviewed.   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 Teflon coated speculum with smoke evacuator placed.  Cervix visualized. Lugol's soln used and area of lesion seen Paracervical block placed.  Small offset Fisher  size loop used to remove cone of cervix using blend of cut and cautery on LEEP machine.  Edges/Base cauterized with Ball.  Monsel's solution used for hemostasis.  Patient tolerated procedure well.  Patient given post procedure instructions.  Follow up in 12 months for repeat pap or as needed.  Adam PhenixJames G Arnold, MD 01/03/2015

## 2015-01-03 NOTE — Patient Instructions (Signed)
Procedimiento de escisin electroquirrgica con asa (Loop Electrosurgical Excision Procedure) El procedimiento de escisin electroquirrgica con asa es la extirpacin de una porcin de la parte inferior del tero (cuello). El procedimento se realiza cuando hay cambios significativamente anormales en las clulas del cuello del tero. Estos cambios pueden causar cncer si se dejan sin tratar.   El procedimiento mismo slo demora algunos minutos. Generalmente se Electronics engineerrealiza en el consultorio del mdico. Se considera un procedimiento seguro para aquellas mujeres que desean o tratan de quedar embarazadas. Solo bajo raras circunstancias este procedimiento se realiza estando embarazada.  INFORME A SU MDICO:   Si est embarazada o no tuvo el ltimo perodo menstrual.  Alergias a alimentos o medicamentos.  Todos los [place] Energymedicamentos que Glade Springutiliza, incluyendo vitaminas, hierbas, gotas oftlmicas, medicamentos de [place]venta libre y Control and instrumentation engineercremas.  Uso de corticoides (por va oral o cremas).  Problemas anteriores debido a anestsicos o a medicamentos que Morgan Stanleydisminuyen la sensibilidad.  Cirugas ginecolgicas previas.  Antecedentes de hemorragias o cogulos sanguneos.  Infecciones recientes o actuales en la vagina (herpes, enfermedades de transmisin sexual).  Otros problemas de [place]alud. RIESGOS Y COMPLICACIONES  Sangrado.  Infecciones.  Lesiones en la vagina, la vejiga o el recto.  Obstruccin rara en la abertura del cuello que causa problemas durante la menstruacin (estenosis cervical). ANTES DEL PROCEDIMIENTO   No tome aspirina ni anticoagulantes durante la semana previa al procedimiento, o segn le hayan indicado.  Consuma una comida ligera antes del procedimiento.  Consulte a su mdico si debe cambiar o suspender los medicamentos que toma habitualmente.  Le administrarn un analgsico 1  2 horas antes del procedimiento. PROCEDIMIENTO   Se coloca un instrumento (espculo) en la vagina. Esto le permite  al mdico observar el cuello.  Se aplica tintura de yodo para encontrar la zona de las clulas anormales.  Se inyecta un medicamento para adormecer el cuello (anestsico local).   Se pasa electricidad a travs de una delgada asa de alambre que luego se [place] para extirpar (cauterizar) un pequeo segmento de la zona afectada.  Se utiliza una ligera electrocauterizacin para sellar los vasos sanguneos pequeos y Social research officer, governmentevitar el sangrado.  Aplicarn una pasta en la zona cauterizada para prevenir el sangrado.  La muestra de tejido se enva al laboratorio. Luego, se examina en el microscopio. DESPUS DEL PROCEDIMIENTO   Pdale a alguna persona que la lleve hasta su casa.  Puede sentir un clico moderado a suave.  Puede notar una secrecin vaginal negra por la pasta usada para prevenir el sangrado. Esto es normal.  Observe si tiene un sangrado excesivo. Esto requiere atencin mdica inmediata.  Consulte con su mdico la fecha en que los resultados estarn disponibles. Asegrese de Starbucks Corporationobtener los resultados. Document Released: 03/05/2011 Document Revised: 09/15/2011 [facility] Center[facility] Patient Information 2015 [place]ExitCare, [place]. This information is not intended to replace advice given to you by your health care provider. Make sure you discuss any questions you have with your health care provider.

## 2015-01-09 ENCOUNTER — Telehealth: Payer: Self-pay | Admitting: *Deleted

## 2015-01-09 NOTE — Telephone Encounter (Signed)
-----   Message from Adam PhenixJames G Arnold, MD sent at 01/09/2015 10:03 AM EDT ----- CIN 2, f/u pap and cotest in 12 months

## 2015-01-09 NOTE — Telephone Encounter (Signed)
Called patient with spanish interpreter. No answer left message to call us back with results.

## 2015-01-12 ENCOUNTER — Encounter (HOSPITAL_COMMUNITY): Payer: Self-pay | Admitting: *Deleted

## 2015-01-12 ENCOUNTER — Inpatient Hospital Stay (HOSPITAL_COMMUNITY)
Admission: AD | Admit: 2015-01-12 | Discharge: 2015-01-12 | Disposition: A | Discharge status: Home / Self Care | Payer: Self-pay | Source: Ambulatory Visit | Attending: Obstetrics & Gynecology | Admitting: Obstetrics & Gynecology

## 2015-01-12 DIAGNOSIS — R109 Unspecified abdominal pain: Secondary | ICD-10-CM | POA: Insufficient documentation

## 2015-01-12 DIAGNOSIS — N888 Other specified noninflammatory disorders of cervix uteri: Secondary | ICD-10-CM

## 2015-01-12 DIAGNOSIS — N939 Abnormal uterine and vaginal bleeding, unspecified: Secondary | ICD-10-CM | POA: Insufficient documentation

## 2015-01-12 MED ORDER — NAPROXEN SODIUM 550 MG PO TABS: 550.0000 mg | ORAL_TABLET | Freq: Two times a day (BID) | ORAL | Status: DC

## 2015-01-12 NOTE — Discharge Instructions (Signed)
Procedimiento de escisin electroquirrgica con asa - Cuidados posteriores  (Loop Electrosurgical Excision Procedure, Care After) Siga estas instrucciones durante las prximas semanas. Estas indicaciones le proporcionan informacin general acerca de cmo deber cuidarse despus del procedimiento. El mdico tambin podr darle instrucciones especficas. El tratamiento ha sido planificado segn las prcticas mdicas actuales, pero en algunos casos pueden ocurrir problemas. Comunquese con el mdico si tiene algn problema o tiene preguntas despus del procedimiento.  INSTRUCCIONES PARA EL CUIDADO EN EL HOGAR   No use tampones, no se d duchas vaginales ni tenga relaciones sexuales durante 2 semanas, o segn lo que le indique su mdico.  Comience con las actividades habituales si no tiene o tiene mnimo de clicos y sangrado, excepto que el mdico le indique lo contrario.  Tmese la temperatura si se siente enfermo. Anote la temperatura en un papel e informe a su mdico que tiene fiebre.  Tome todos los medicamentos segn le indic su mdico.  Cumpla con todas las visitas de control y los papanicolau, segn le indique su mdico. SOLICITE ATENCIN MDICA DE INMEDIATO SI:   Tiene un sangrado ms abundante o que dura ms que el ciclo menstrual normal.  Tiene un sangrado de color rojo brillante.  Elimina cogulos de sangre.  Tiene fiebre.  Siente clicos o el dolor no se alivia con la medicacin.  Siente dolor abdominal que no parece estar relacionado con la misma zona en que sinti los clicos y el dolor.  Se siente mareada, dbil o se desmaya.  Comienza a sentir dolor al orinar u observa sangre.  Tiene una secrecin vaginal con mal olor. ASEGRESE DE QUE:   Comprende estas instrucciones.  Controlar su enfermedad.  Solicitar ayuda de inmediato si no mejora o si empeora. Document Released: 03/06/2011 Document Revised: 09/15/2011 ExitCare Patient Information 2015 ExitCare, LLC.  This information is not intended to replace advice given to you by your health care provider. Make sure you discuss any questions you have with your health care provider.  

## 2015-01-12 NOTE — MAU Provider Note (Signed)
  History     CSN: [id number]  Arrival date and time: 01/12/15 1713   First Provider Initiated Contact with Patient 01/12/15 1810      Chief Complaint  Patient presents with  . Vaginal Bleeding   HPI [name] 39 y.o. [id number] s/p LEEP on 6/29.  Since that time, she is having abdominal pain and having vaginal bleeding.  Her bleeding is noted when she sits on the toilet and with exercise.  This is similar to menses.  She changes her pad 2x perday because the larger amt is with sitting.  Pain is 7-8/10 without medicine and constant located from lower mid abdomen to back - similar to menstrual cramps.  She has tried ibuprofen and tylenol which help briefly.  Denies nausea, vomiting, fever, headache, syncope.  OB History    Gravida Para Term Preterm AB TAB SAB Ectopic Multiple Living   5 3 3  2 1 1   3       Past Medical History  Diagnosis Date  . Abnormal uterine bleeding   . Vaginal Pap smear, abnormal   . Urinary tract infection 12/2014    Past Surgical History  Procedure Laterality Date  . Cesarean section    . Leep      Family History  Problem Relation Age of Onset  . Diabetes Mother   . Hypertension Mother   . Breast cancer Paternal Grandmother     History  Substance Use Topics  . Smoking status: Never Smoker   . Smokeless tobacco: Never Used  . Alcohol Use: 1.2 oz/week    2 Cans of beer per week    Allergies: No Known Allergies  Prescriptions prior to admission  Medication Sig Dispense Refill Last Dose  . acetaminophen (TYLENOL) 500 MG tablet Take 1,000 mg by mouth every 6 (six) hours as needed for mild pain or moderate pain.   01/12/2015 at Unknown time  . ibuprofen (ADVIL,MOTRIN) 200 MG tablet Take 400 mg by mouth every 6 (six) hours as needed for mild pain or cramping.   01/11/2015 at Unknown time    ROS Pertinent ROS in HPI.  All other systems are negative.   Physical Exam   Blood pressure 118/64, pulse 80, temperature 98.6 F (37 C),  temperature source Oral, resp. rate 18, last menstrual period 12/31/2014.  Physical Exam  Constitutional: She is oriented to person, place, and time. She appears well-developed and well-nourished. No distress.  HENT:  Head: Normocephalic and atraumatic.  Eyes: EOM are normal.  Neck: Normal range of motion.  Cardiovascular: Normal rate.   Respiratory: No respiratory distress.  GI: Soft. She exhibits no distension. There is no tenderness.  Genitourinary:  Small amt of bleeding from cervix  Musculoskeletal: Normal range of motion.  Neurological: She is alert and oriented to person, place, and time.  Skin: Skin is warm and dry.  Psychiatric: She has a normal mood and affect.    MAU Course  Procedures  MDM VSS.  Small amt of bleeding.  Pain generally relieved with 500mg  Tylenol.   Assessment and Plan  A:  1. Bleeding of cervix     P: Discharge to home Anaprox DS rx given -  If further concerns - f/u in clinic  Patient may return to MAU as needed or if her condition were to change or worsen   Bertram Denvereague Clark, Chaka Jefferys E 01/12/2015, 6:10 PM

## 2015-01-12 NOTE — MAU Note (Signed)
Had LEEP procedure 1 week ago by Dr. Debroah LoopArnold. Has been bleeding a lot since. Was told to come to MAU with heavy bleeding. Has cramping in lower abdomen and around to her back. Feels bloated.

## 2015-01-12 NOTE — Telephone Encounter (Signed)
Called patient with pacific interpreter [phone]- no answer. Left message that we are trying to reach you with results, nothing urgent but please call us back at the clinics

## 2015-01-17 ENCOUNTER — Encounter: Payer: Self-pay | Admitting: *Deleted

## 2015-01-17 NOTE — Telephone Encounter (Signed)
Certified letter to patient with results and recommendations.

## 2015-01-22 ENCOUNTER — Inpatient Hospital Stay (HOSPITAL_COMMUNITY)
Admission: AD | Admit: 2015-01-22 | Discharge: 2015-01-23 | Disposition: A | Discharge status: Home / Self Care | Payer: Self-pay | Source: Ambulatory Visit | Attending: Obstetrics & Gynecology | Admitting: Obstetrics & Gynecology

## 2015-01-22 DIAGNOSIS — Z9889 Other specified postprocedural states: Secondary | ICD-10-CM

## 2015-01-22 DIAGNOSIS — N888 Other specified noninflammatory disorders of cervix uteri: Secondary | ICD-10-CM

## 2015-01-22 DIAGNOSIS — N939 Abnormal uterine and vaginal bleeding, unspecified: Secondary | ICD-10-CM | POA: Insufficient documentation

## 2015-01-23 ENCOUNTER — Encounter (HOSPITAL_COMMUNITY): Payer: Self-pay | Admitting: *Deleted

## 2015-01-23 DIAGNOSIS — N888 Other specified noninflammatory disorders of cervix uteri: Secondary | ICD-10-CM

## 2015-01-23 LAB — CBC WITH DIFFERENTIAL/PLATELET
BASOS ABS: 0.1 10*3/uL (ref 0.0–0.1)
BASOS PCT: 1 % (ref 0–1)
EOS ABS: 0.1 10*3/uL (ref 0.0–0.7)
EOS PCT: 2 % (ref 0–5)
HCT: 34.3 % — ABNORMAL LOW (ref 36.0–46.0)
Hemoglobin: 12 g/dL (ref 12.0–15.0)
Lymphocytes Relative: 42 % (ref 12–46)
Lymphs Abs: 2.1 10*3/uL (ref 0.7–4.0)
MCH: 31.5 pg (ref 26.0–34.0)
MCHC: 35 g/dL (ref 30.0–36.0)
MCV: 90 fL (ref 78.0–100.0)
MONO ABS: 0.5 10*3/uL (ref 0.1–1.0)
Monocytes Relative: 11 % (ref 3–12)
NEUTROS ABS: 2.2 10*3/uL (ref 1.7–7.7)
Neutrophils Relative %: 44 % (ref 43–77)
Platelets: 256 10*3/uL (ref 150–400)
RBC: 3.81 MIL/uL — AB (ref 3.87–5.11)
RDW: 13.2 % (ref 11.5–15.5)
WBC: 4.9 10*3/uL (ref 4.0–10.5)

## 2015-01-23 LAB — URINALYSIS, ROUTINE W REFLEX MICROSCOPIC
Bilirubin Urine: NEGATIVE
GLUCOSE, UA: NEGATIVE mg/dL
Ketones, ur: NEGATIVE mg/dL
Nitrite: NEGATIVE
PH: 6.5 (ref 5.0–8.0)
Protein, ur: NEGATIVE mg/dL
SPECIFIC GRAVITY, URINE: 1.02 (ref 1.005–1.030)
UROBILINOGEN UA: 0.2 mg/dL (ref 0.0–1.0)

## 2015-01-23 LAB — URINE MICROSCOPIC-ADD ON

## 2015-01-23 LAB — POCT PREGNANCY, URINE: Preg Test, Ur: NEGATIVE

## 2015-01-23 NOTE — Discharge Instructions (Signed)
Procedimiento de escisin electroquirrgica con asa - Cuidados posteriores  (Loop Electrosurgical Excision Procedure, Care After) Siga estas instrucciones durante las prximas semanas. Estas indicaciones le proporcionan informacin general acerca de cmo deber cuidarse despus del procedimiento. El mdico tambin podr darle instrucciones especficas. El tratamiento ha sido planificado segn las prcticas mdicas actuales, pero en algunos casos pueden ocurrir problemas. Comunquese con el mdico si tiene algn problema o tiene preguntas despus del procedimiento.  INSTRUCCIONES PARA EL CUIDADO EN EL HOGAR   No use tampones, no se d duchas vaginales ni tenga relaciones sexuales durante 2 semanas, o segn lo que le indique su mdico.  Comience con las actividades habituales si no tiene o tiene mnimo de clicos y sangrado, excepto que el mdico le indique lo contrario.  Tmese la temperatura si se siente enfermo. Anote la temperatura en un papel e informe a su mdico que tiene fiebre.  Tome todos los medicamentos segn le indic su mdico.  Cumpla con todas las visitas de control y los papanicolau, segn le indique su mdico. SOLICITE ATENCIN MDICA DE INMEDIATO SI:   Tiene un sangrado ms abundante o que dura ms que el ciclo menstrual normal.  Tiene un sangrado de color rojo brillante.  Elimina cogulos de sangre.  Tiene fiebre.  Siente clicos o el dolor no se alivia con la medicacin.  Siente dolor abdominal que no parece estar relacionado con la misma zona en que sinti los clicos y el dolor.  Se siente mareada, dbil o se desmaya.  Comienza a sentir dolor al orinar u observa sangre.  Tiene una secrecin vaginal con mal olor. ASEGRESE DE QUE:   Comprende estas instrucciones.  Controlar su enfermedad.  Solicitar ayuda de inmediato si no mejora o si empeora. Document Released: 03/06/2011 Document Revised: 09/15/2011 ExitCare Patient Information 2015 ExitCare, LLC.  This information is not intended to replace advice given to you by your health care provider. Make sure you discuss any questions you have with your health care provider.  

## 2015-01-23 NOTE — MAU Note (Signed)
Pt states on June 29 she had a LEEP in the clinic and has continued bleeding since then, was seen in the clinic on Friday and was given meds but the bleeding remains heavy. Changing a pad q 3-4 hours.

## 2015-01-23 NOTE — MAU Provider Note (Signed)
History     CSN: [id number]  Arrival date and time: 01/22/15 2238   First Provider Initiated Contact with Patient 01/23/15 0025      No chief complaint on file.  HPI  Ms. [name] is a 39 y.o. [id number] who presents to MAU today with complaint of vaginal bleeding. The patient states daily bleeding since LEEP procedure on 01/03/15. She states that today bleeding is heavier than previously. She has changed her pad q 3-4 hours today. She denies abdominal pain now, but was having some cramping earlier today. She denies fever. She is not taking anything for the pain or bleeding. When she was seen in MAU on 01/12/15 with the same complaint she was given Rx for Anaprox, but states that she was told only to take it for 5 days. She states that she sometimes has dizziness. She denies LOC.   OB History    Gravida Para Term Preterm AB TAB SAB Ectopic Multiple Living   Past Medical History  Diagnosis Date  . Abnormal uterine bleeding   . Vaginal Pap smear, abnormal   . Urinary tract infection 12/2014    Past Surgical History  Procedure Laterality Date  . Cesarean section    . Leep      Family History  Problem Relation Age of Onset  . Diabetes Mother   . Hypertension Mother   . Breast cancer Paternal Grandmother     History  Substance Use Topics  . Smoking status: Never Smoker   . Smokeless tobacco: Never Used  . Alcohol Use: 1.2 oz/week    2 Cans of beer per week    Allergies: No Known Allergies  No prescriptions prior to admission    Review of Systems  Constitutional: Negative for fever and malaise/fatigue.  Gastrointestinal: Positive for abdominal pain.  Genitourinary: Negative for dysuria, urgency and frequency.       + vaginal bleeding   Physical Exam   Blood pressure 125/74, pulse 70, resp. rate 16, height  (1.575 m), weight 146 lb (66.225 kg), last menstrual period 12/31/2014, SpO2 100 %.  Physical Exam  Nursing note and  vitals reviewed. Constitutional: She is oriented to person, place, and time. She appears well-developed and well-nourished. No distress.  HENT:  Head: Normocephalic and atraumatic.  Cardiovascular: Normal rate.   Respiratory: Effort normal.  GI: Soft. She exhibits no distension and no mass. There is no tenderness. There is no rebound and no guarding.  Genitourinary: There is bleeding (small amount of bleeding from the cervix) in the vagina.  Neurological: She is alert and oriented to person, place, and time.  Skin: Skin is warm and dry. No erythema.  Psychiatric: She has a normal mood and affect.   Results for orders placed or performed during the hospital encounter of 01/22/15 (from the past 24 hour(s))  Urinalysis, Routine w reflex microscopic (not at [facility])     Status: Abnormal   Collection Time: 01/23/15 12:05 AM  Result Value Ref Range   Color, Urine YELLOW YELLOW   APPearance CLOUDY (A) CLEAR   Specific Gravity, Urine 1.020 1.005 - 1.030   pH 6.5 5.0 - 8.0   Glucose, UA NEGATIVE NEGATIVE mg/dL   Hgb urine dipstick LARGE (A) NEGATIVE   Bilirubin Urine NEGATIVE NEGATIVE   Ketones, ur NEGATIVE NEGATIVE mg/dL   Protein, ur NEGATIVE NEGATIVE mg/dL   Urobilinogen, UA 0.2 0.0 - 1.0 mg/dL  Nitrite NEGATIVE NEGATIVE   Leukocytes, UA TRACE (A) NEGATIVE  Urine microscopic-add on     Status: Abnormal   Collection Time: 01/23/15 12:05 AM  Result Value Ref Range   Squamous Epithelial / LPF RARE RARE   WBC, UA 0-2 <3 WBC/hpf   RBC / HPF 11-20 <3 RBC/hpf   Bacteria, UA FEW (A) RARE   Urine-Other AMORPHOUS URATES/PHOSPHATES   Pregnancy, urine POC     Status: None   Collection Time: 01/23/15 12:20 AM  Result Value Ref Range   Preg Test, Ur NEGATIVE NEGATIVE  CBC with Differential/Platelet     Status: Abnormal   Collection Time: 01/23/15 12:38 AM  Result Value Ref Range   WBC 4.9 4.0 - 10.5 K/uL   RBC 3.81 (L) 3.87 - 5.11 MIL/uL   Hemoglobin 12.0 12.0 - 15.0 g/dL   HCT 60.434.3 (L) 54.036.0  - 46.0 %   MCV 90.0 78.0 - 100.0 fL   MCH 31.5 26.0 - 34.0 pg   MCHC 35.0 30.0 - 36.0 g/dL   RDW 98.113.2 19.111.5 - 47.815.5 %   Platelets 256 150 - 400 K/uL   Neutrophils Relative % 44 43 - 77 %   Neutro Abs 2.2 1.7 - 7.7 K/uL   Lymphocytes Relative 42 12 - 46 %   Lymphs Abs 2.1 0.7 - 4.0 K/uL   Monocytes Relative 11 3 - 12 %   Monocytes Absolute 0.5 0.1 - 1.0 K/uL   Eosinophils Relative 2 0 - 5 %   Eosinophils Absolute 0.1 0.0 - 0.7 K/uL   Basophils Relative 1 0 - 1 %   Basophils Absolute 0.1 0.0 - 0.1 K/uL    MAU Course  Procedures None  MDM Notes from previous visits reviewed UPT - negative UA, CBC today Patient is hemodynamically stable today Discussed with Dr. Macon LargeAnyanwu. She recommends Monsels solution to help with hemostasis Monsel's solution applied with large scopettes. Good hemostasis noted. Patient counseled on pelvic rest and expected discharge with Monsel's application.   Assessment and Plan  A: S/P LEEP Bleeding of cervix  P: Discharge home Patient advised to continue Anaprox as needed for pain Bleeding precautions and pelvic rest discussed Patient advised to follow-up with [facility] as scheduled or sooner PRN Patient may return to MAU as needed or if her condition were to change or worsen   Marny LowensteinJulie N Wenzel, PA-C  01/23/2015, 1:34 AM

## 2015-02-16 ENCOUNTER — Ambulatory Visit: Payer: Self-pay | Admitting: Obstetrics & Gynecology

## 2017-12-23 ENCOUNTER — Encounter: Payer: Self-pay | Admitting: Family Medicine

## 2017-12-23 ENCOUNTER — Ambulatory Visit: Payer: Self-pay | Admitting: Family Medicine

## 2017-12-23 VITALS — BP 118/70 | HR 74 | Temp 98.1°F | Ht 61.0 in | Wt 146.8 lb

## 2017-12-23 DIAGNOSIS — D708 Other neutropenia: Secondary | ICD-10-CM

## 2017-12-23 DIAGNOSIS — M5432 Sciatica, left side: Secondary | ICD-10-CM

## 2017-12-23 DIAGNOSIS — I7300 Raynaud's syndrome without gangrene: Secondary | ICD-10-CM

## 2017-12-23 DIAGNOSIS — R202 Paresthesia of skin: Secondary | ICD-10-CM

## 2017-12-23 DIAGNOSIS — Z13220 Encounter for screening for lipoid disorders: Secondary | ICD-10-CM

## 2017-12-23 DIAGNOSIS — M25561 Pain in right knee: Secondary | ICD-10-CM

## 2017-12-23 DIAGNOSIS — M25562 Pain in left knee: Secondary | ICD-10-CM

## 2017-12-23 LAB — COMPREHENSIVE METABOLIC PANEL
ALK PHOS: 61 U/L (ref 39–117)
ALT: 17 U/L (ref 0–35)
AST: 18 U/L (ref 0–37)
Albumin: 4.3 g/dL (ref 3.5–5.2)
BUN: 8 mg/dL (ref 6–23)
CALCIUM: 9.3 mg/dL (ref 8.4–10.5)
CO2: 30 mEq/L (ref 19–32)
CREATININE: 0.67 mg/dL (ref 0.40–1.20)
Chloride: 105 mEq/L (ref 96–112)
GFR: 125.71 mL/min (ref >60.00)
GLUCOSE: 100 mg/dL — AB (ref 70–99)
Potassium: 4.1 mEq/L (ref 3.5–5.1)
Sodium: 139 mEq/L (ref 135–145)
Total Bilirubin: 0.6 mg/dL (ref 0.2–1.2)
Total Protein: 7.7 g/dL (ref 6.0–8.3)

## 2017-12-23 LAB — CBC WITH DIFFERENTIAL/PLATELET
Basophils Absolute: 0 10*3/uL (ref 0.0–0.1)
Basophils Relative: 1.3 % (ref 0.0–3.0)
EOS ABS: 0 10*3/uL (ref 0.0–0.7)
Eosinophils Relative: 1.2 % (ref 0.0–5.0)
HCT: 38.9 % (ref 36.0–46.0)
HEMOGLOBIN: 13.1 g/dL (ref 12.0–15.0)
Lymphocytes Relative: 47 % — ABNORMAL HIGH (ref 12.0–46.0)
Lymphs Abs: 1 10*3/uL (ref 0.7–4.0)
MCHC: 33.7 g/dL (ref 30.0–36.0)
MCV: 95.7 fl (ref 78.0–100.0)
Monocytes Absolute: 0.2 10*3/uL (ref 0.1–1.0)
Monocytes Relative: 11.1 % (ref 3.0–12.0)
Neutro Abs: 0.9 10*3/uL — ABNORMAL LOW (ref 1.4–7.7)
Neutrophils Relative %: 39.4 % — ABNORMAL LOW (ref 43.0–77.0)
PLATELETS: 243 10*3/uL (ref 150.0–400.0)
RBC: 4.07 Mil/uL (ref 3.87–5.11)
RDW: 13.1 % (ref 11.5–15.5)

## 2017-12-23 LAB — LIPID PANEL
Cholesterol: 165 mg/dL (ref 0–200)
HDL: 66.4 mg/dL (ref >39.00)
LDL CALC: 87 mg/dL (ref 0–99)
NONHDL: 99
Total CHOL/HDL Ratio: 2
Triglycerides: 59 mg/dL (ref 0.0–149.0)
VLDL: 11.8 mg/dL (ref 0.0–40.0)

## 2017-12-23 LAB — VITAMIN D 25 HYDROXY (VIT D DEFICIENCY, FRACTURES): VITD: 35.99 ng/mL (ref 30.00–100.00)

## 2017-12-23 LAB — TSH: TSH: 1.32 u[IU]/mL (ref 0.35–4.50)

## 2017-12-23 MED ORDER — MELOXICAM 15 MG PO TABS: 15.0000 mg | ORAL_TABLET | Freq: Every day | ORAL | 0 refills | Status: DC

## 2017-12-23 MED ORDER — GABAPENTIN 300 MG PO CAPS: 300.0000 mg | ORAL_CAPSULE | Freq: Three times a day (TID) | ORAL | 0 refills | Status: DC

## 2017-12-23 NOTE — Progress Notes (Signed)
Patient: [name] MRN: [id number] DOB: 05/09/1976 PCP: Orland Mustard, MD     Subjective:  Chief Complaint  Patient presents with  . Establish Care    b/l knee pain, headaches    HPI: The patient is a 42 y.o. female who presents today for bilateral knee pain. Her left leg hurts more and the pain starts in her back/hip area and goes all the way down to her ankle. She feels the pain more inside of her leg. She feels like the pain is in her bone. The right leg is more in her knee. The pain is not as bad. She has had xrays on both of her knees before and they were both normal. This was 8 years ago. Left leg pain is a 7/10 and is "intense" and sharp. Pain comes and goes. When pain is bad she takes indomethicin and this seems to help her. If she sits for awhile this seems to make the pain worse. Massaging also seems to make it better. She denies any trauma to her hip/leg or knee. She sometimes has a burning sensation down the left leg and has some tingling down her leg. No weakness in her legs.  As far as her knees go, going up stairs make it worse. She rates the pain as an 8/10. She feels like she gets swelling in her knee as well, but only on the left knee. Pain described as sharp or stabbing. She states 800mg  of ibuprofen helps her. If she takes low dose it does not help her. NO family history of auto immune/RA. She does not exercise. She is tired all of the time.   Hand issue: She states her fingers turn white at times. It has been going on for about one year. She states can happen to all of her fingers. She states they will stay white for about 20 minutes then they will have the blood return. She denies her toes turning white and denies sweating in her hands when she is hot. She gets tingling in her finger tips at times as well. No weakness or numbness.    Review of Systems  Constitutional: Positive for diaphoresis and fatigue. Negative for chills, fever and unexpected weight  change.  HENT: Negative for congestion, dental problem, ear pain, hearing loss and trouble swallowing.   Eyes: Negative for visual disturbance.  Respiratory: Negative for cough, chest tightness and shortness of breath.   Cardiovascular: Negative for chest pain, palpitations and leg swelling.  Gastrointestinal: Negative for abdominal pain, blood in stool, diarrhea and nausea.  Endocrine: Negative for cold intolerance, polydipsia, polyphagia and polyuria.  Genitourinary: Negative for dysuria and hematuria.  Musculoskeletal: Positive for arthralgias and joint swelling.  Skin: Negative for rash.  Neurological: Negative for dizziness and headaches.       Tingling in extremities. Fingers turn white at times   Hematological: Does not bruise/bleed easily.  Psychiatric/Behavioral: Negative for dysphoric mood and sleep disturbance. The patient is not nervous/anxious.     Allergies Patient has No Known Allergies.  Past Medical History Patient  has a past medical history of Abnormal uterine bleeding, History of chicken pox, Urinary tract infection (12/2014), and Vaginal Pap smear, abnormal.  Surgical History Patient  has a past surgical history that includes Cesarean section and LEEP.  Family History Pateint's family history includes Breast cancer in her paternal grandmother; Diabetes in her mother; Hypertension in her mother.  Social History Patient  reports that she has never smoked. She has never used smokeless tobacco.  She reports that she drinks about 1.2 oz of alcohol per week. She reports that she does not use drugs.    Objective: Vitals:   12/23/17 0917  BP: 118/70  Pulse: 74  Temp: 98.1 F (36.7 C)  TempSrc: Oral  Weight: 146 lb 12.8 oz (66.6 kg)  Height: 5\' 1"  (1.549 m)    Body mass index is 27.74 kg/m.  Physical Exam  Constitutional: She is oriented to person, place, and time. She appears well-developed and well-nourished.  HENT:  Right Ear: External ear normal.  Left  Ear: External ear normal.  Mouth/Throat: Oropharynx is clear and moist.  Eyes: Pupils are equal, round, and reactive to light. Conjunctivae and EOM are normal.  Neck: Normal range of motion. Neck supple. No thyromegaly present.  Cardiovascular: Normal rate, regular rhythm, normal heart sounds and intact distal pulses.  No murmur heard. Pulmonary/Chest: Effort normal and breath sounds normal.  Abdominal: Soft. Bowel sounds are normal. She exhibits no distension. There is no tenderness.  Musculoskeletal:  Knee exam: full flexion/extension bilaterally. No crepitus. No erythema/edema. No warmth to touch. +pain in left knee with valgus strain. Negative draw signs. Negative mcmurray. Gait normal.   Lymphadenopathy:    She has no cervical adenopathy.  Neurological: She is alert and oriented to person, place, and time. She displays normal reflexes. No cranial nerve deficit or sensory deficit. She exhibits normal muscle tone. Coordination normal.  Negative phalen's and tinel exam.  Sensation intact. UE and LE strength 5/5 bilaterally. Hand grip intact and 5/5 bilaterally   Skin: Skin is warm and dry. No rash noted.  Psychiatric: She has a normal mood and affect. Her behavior is normal.  Vitals reviewed.      Assessment/plan: 1. Acute pain of both knees ?fibro vs. OA vs. Auto immune. Checking labs and changing her to mobic prn. Declines xray at this time. She is self pay and we are going to do lab work up first. If normal, will have her come back in for xrays and possibly f/u with dr. Berline Chough. Wrote down instructions for mobic and had translator go over with her. She is to stop the indomethacin and not take any other NSAIDs if she is taking this drug.  Will f/u with me in 3 months and then sooner if we need to do xrays.  - Rheumatoid factor  2. Raynaud's phenomenon without gangrene Checking labs at this time. Making sure not associated with other auto immune issue. Will f/u with labs and see her back  in 3 months time.  - ANA  3. Tingling of both upper extremities Rule out thyroid, auto immune, diabetes. F/u on labs and then go from there. See her back in 3 months and if normal work up ? Neuro referral for emg.  - CBC with Differential/Platelet - Comprehensive metabolic panel - TSH - VITAMIN D 25 Hydroxy (Vit-D Deficiency, Fractures) - ANA  4. Sciatica of left side Handout  Of exercises given. Discussed stretching is the mainstay of treatment. mobic prn and adding on gabapentin. Again went over how to take this drug and side effects of medication. F/u in 3 months time. If not better consider PT and possible imaging.   5. Screening cholesterol level =  - Lipid panel   Return in about 3 months (around 03/25/2018).    Orland Mustard, MD [facility]   12/23/2017

## 2017-12-23 NOTE — Patient Instructions (Signed)
Gabapentin: can take up to three times a day for nerve pain down her leg. May make her drowsy. Would start off once-twice/day.   Meloxicam: can take once/day for pain in the knees. Do not take any other NSAIDs while on this.

## 2017-12-24 ENCOUNTER — Other Ambulatory Visit: Payer: Self-pay | Admitting: Family Medicine

## 2017-12-24 DIAGNOSIS — D708 Other neutropenia: Secondary | ICD-10-CM

## 2017-12-24 NOTE — Addendum Note (Signed)
Addended by: Felix AhmadiFRANSEN, Rolan Wrightsman A on: 12/24/2017 08:37 AM   Modules accepted: Orders

## 2017-12-24 NOTE — Progress Notes (Signed)
Contacted [employer] and had interpreter Judeth CornfieldStephanie [facility] [phone]) initiate call to DPR in pt's chart-[name].  No answer and voice mailbox was full.  Interpreter also attempted to contact pt at home # and no answer; interpreter left voicemail for pt to return call to discuss lab results and appt to return for repeat labs will be scheduled at that time.

## 2017-12-24 NOTE — Progress Notes (Signed)
Hey jenn- Can you tell her I need her to come back in for another lab test? Her white count was low and we need to look at blood closer. It's called a peripheral smear. She doesn't need appointment, can just come in for lab test. May need a tranlator for this... Order is already in.

## 2017-12-24 NOTE — Progress Notes (Signed)
[facility] just called and said it will now be too late to send the specimen for a peripheral smear. She was drawn at 10:00am yesterday and by the time [facility] would receive the tube it will be too late. The patient will need to be contacted to come in for a re-draw. Sorry.

## 2017-12-24 NOTE — Addendum Note (Signed)
Addended by: Felix AhmadiFRANSEN, Ronna Herskowitz A on: 12/24/2017 08:35 AM   Modules accepted: Orders

## 2017-12-24 NOTE — Progress Notes (Signed)
[facility] and they will send the smear to [facility] with her CBC results.  Thank you.

## 2017-12-24 NOTE — Progress Notes (Signed)
Thank you :)

## 2017-12-25 LAB — ANTI-NUCLEAR AB-TITER (ANA TITER)

## 2017-12-25 LAB — RHEUMATOID FACTOR: Rhuematoid fact SerPl-aCnc: <14 IU/mL (ref <14)

## 2017-12-25 LAB — ANA: Anti Nuclear Antibody(ANA): POSITIVE — AB

## 2017-12-28 ENCOUNTER — Other Ambulatory Visit: Payer: Self-pay | Admitting: Family Medicine

## 2017-12-28 DIAGNOSIS — R52 Pain, unspecified: Secondary | ICD-10-CM

## 2017-12-28 DIAGNOSIS — R768 Other specified abnormal immunological findings in serum: Secondary | ICD-10-CM

## 2017-12-29 ENCOUNTER — Other Ambulatory Visit: Payer: Self-pay

## 2017-12-29 ENCOUNTER — Other Ambulatory Visit (INDEPENDENT_AMBULATORY_CARE_PROVIDER_SITE_OTHER): Payer: Self-pay

## 2017-12-29 ENCOUNTER — Telehealth: Payer: Self-pay | Admitting: *Deleted

## 2017-12-29 DIAGNOSIS — D708 Other neutropenia: Secondary | ICD-10-CM

## 2017-12-29 NOTE — Telephone Encounter (Signed)
Noted that pt is coming in today 12/29/17 for lab appt for peripheral smear.  Will speak to pt while in office to explain why she needs appt w/rheumatology.  Referral was already put in for this.  Will explain to pt to be expecting a call from rheumatologist's office to schedule appt.

## 2017-12-29 NOTE — Telephone Encounter (Signed)
Spoke to pt and explained why she needs to go and see a rheumatologist.  Pt was also confused as to why she needed blood test that was done today (peripheral smear).  I explained to pt that test was done because her WBC's were low on her CBC and the smear needed to be done to find out why.  Also explained to pt that rheumatologist's office should be calling her to schedule appt.  Pt verbalized understanding and had no further questions.

## 2017-12-29 NOTE — Progress Notes (Signed)
Pt coming today to have smear review drawn. Per [facility], patient will also need another CBC w/diff drawn. Since Dr. Artis FlockWolfe is out of the office today received verbal okay from Dr. Earlene PlaterWallace to add on CBC w/diff.

## 2017-12-29 NOTE — Telephone Encounter (Signed)
See note

## 2017-12-29 NOTE — Telephone Encounter (Signed)
Pt's friend 'Elease Hashimotoatricia" calling back (on DPR.) Pt present during call. pt does not understand. States message left on VM pt does not understand.  Reviewed message from Dr. Artis FlockWolfe. Pt and friend verbalize understanding. Will come in this afternoon for lab f/u.

## 2017-12-29 NOTE — Addendum Note (Signed)
Addended by: Felix AhmadiFRANSEN, Renelda Kilian A on: 12/29/2017 10:05 AM   Modules accepted: Orders

## 2017-12-30 LAB — PATHOLOGIST SMEAR REVIEW

## 2017-12-30 LAB — CBC WITH DIFFERENTIAL/PLATELET
BASOS PCT: 1.1 %
Basophils Absolute: 41 cells/uL (ref 0–200)
EOS PCT: 1.9 %
Eosinophils Absolute: 70 cells/uL (ref 15–500)
HCT: 35.8 % (ref 35.0–45.0)
Hemoglobin: 12.1 g/dL (ref 11.7–15.5)
LYMPHS ABS: 1621 {cells}/uL (ref 850–3900)
MCH: 31.9 pg (ref 27.0–33.0)
MCHC: 33.8 g/dL (ref 32.0–36.0)
MCV: 94.5 fL (ref 80.0–100.0)
MPV: 9.5 fL (ref 7.5–12.5)
Monocytes Relative: 13.7 %
Neutro Abs: 1462 cells/uL — ABNORMAL LOW (ref 1500–7800)
Neutrophils Relative %: 39.5 %
PLATELETS: 221 10*3/uL (ref 140–400)
RBC: 3.79 10*6/uL — ABNORMAL LOW (ref 3.80–5.10)
RDW: 12 % (ref 11.0–15.0)
Total Lymphocyte: 43.8 %
WBC: 3.7 10*3/uL — ABNORMAL LOW (ref 3.8–10.8)
WBCMIX: 507 {cells}/uL (ref 200–950)

## 2017-12-31 ENCOUNTER — Telehealth: Payer: Self-pay

## 2017-12-31 NOTE — Telephone Encounter (Signed)
See note

## 2017-12-31 NOTE — Telephone Encounter (Signed)
Pt declined appt with Rheumatology because she said she lost her insurance and cannot afford it. Adrienne with Rheumatology is asking if there is an assistance program or a way we can assist pt. Requesting we contact the pt as well.

## 2017-12-31 NOTE — Telephone Encounter (Signed)
Spoke to pt via [employer], [place] [phone].  Patient declined her most recent appt w/Rheumatology due to having no insurance and unable to afford the visit.  Advised patient to contact the [facility] in [place] for assistance.  Gave patient the phone number and address for [facility] and also gave her the number for Financial Assistance through [facility].  Pt verbalized understanding and will contact both places for further assistance.

## 2018-01-04 ENCOUNTER — Encounter: Payer: Self-pay | Admitting: Family Medicine

## 2018-01-04 DIAGNOSIS — R768 Other specified abnormal immunological findings in serum: Secondary | ICD-10-CM | POA: Insufficient documentation

## 2018-01-04 DIAGNOSIS — D709 Neutropenia, unspecified: Secondary | ICD-10-CM | POA: Insufficient documentation

## 2018-01-26 ENCOUNTER — Ambulatory Visit: Payer: Managed Care, Other (non HMO) | Admitting: Rheumatology

## 2018-02-01 NOTE — Progress Notes (Signed)
Office Visit Note  Patient: [name]             Date of Birth: 10-Oct-1975           MRN: [id number]             PCP: Orland Mustard, MD Referring: Orland Mustard, MD Visit Date: 02/11/2018 Occupation: @GUAROCC @  Interpreter: Denman George Subjective:  Pain in triple joints and positive ANA.   History of Present Illness: [name] is a 42 y.o. female seen in consultation per request of her PCP.  Patient was accompanied by interpreter and her friend today.  She states she has had pain in her knee joints for almost 8 years.  In the last 3 months she has been experiencing increased knee joint pain and fatigue.  She states her knee joints are off and on.  Her left knee joint swells more than the right knee joint.  She also notices discomfort in her bilateral shoulders bilateral elbows bilateral wrists bilateral hands and her feet.  She states she was seen by her PCP recently and had labs which showed positive ANA and for that reason she was referred to me.  She denies any discoloration in her hands but she notices her hands get pale at times.  There is history of arthritis in her paternal aunts.  She denies any family history of autoimmune disease.  Activities of Daily Living:  Patient reports morning stiffness for 10 minutes.   Patient Reports nocturnal pain.  Difficulty dressing/grooming: Denies Difficulty climbing stairs: Denies Difficulty getting out of chair: Reports Difficulty using hands for taps, buttons, cutlery, and/or writing: Reports  Review of Systems  Constitutional: Positive for fatigue. Negative for night sweats, weight gain and weight loss.  HENT: Positive for mouth sores and mouth dryness. Negative for trouble swallowing, trouble swallowing and nose dryness.   Eyes: Positive for dryness. Negative for pain, redness, itching and visual disturbance.  Respiratory: Negative for cough, shortness of breath and difficulty breathing.   Cardiovascular:  Negative for chest pain, palpitations, hypertension, irregular heartbeat and swelling in legs/feet.  Gastrointestinal: Positive for constipation. Negative for abdominal pain, blood in stool and diarrhea.  Endocrine: Positive for increased urination.  Genitourinary: Negative for painful urination, pelvic pain and vaginal dryness.  Musculoskeletal: Positive for arthralgias, joint pain, joint swelling and morning stiffness. Negative for gait problem, myalgias, muscle weakness, muscle tenderness and myalgias.  Skin: Positive for color change, rash, hair loss and sensitivity to sunlight. Negative for skin tightness and ulcers.  Allergic/Immunologic: Negative for susceptible to infections.  Neurological: Positive for dizziness, headaches and weakness. Negative for light-headedness, memory loss and night sweats.  Hematological: Positive for bruising/bleeding tendency. Negative for swollen glands.  Psychiatric/Behavioral: Negative for depressed mood, confusion and sleep disturbance. The patient is not nervous/anxious.     PMFS History:  Patient Active Problem List   Diagnosis Date Noted  . Neutropenia (HCC) 01/04/2018  . ANA positive 01/04/2018  . 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 Abnormal uterine bleeding   . History of chicken pox   . Urinary tract infection 12/2014  . Vaginal Pap smear, abnormal     Family History  Problem Relation Age of Onset  . Diabetes Mother   . Hypertension Mother   . High Cholesterol Mother   . Breast cancer Paternal Grandmother   . Healthy Sister   . Healthy Brother   . Healthy Brother   .  Healthy Son   . Healthy Son   . Healthy Son    Past Surgical History:  Procedure Laterality Date  . CESAREAN SECTION    . LEEP     Social History   Social History Narrative  . Not on file    Objective: Vital Signs: BP 121/83 (BP Location: Left Arm, Patient Position: Sitting, Cuff Size: Normal)   Pulse  67   Resp 14   Ht 5' 2.21" (1.58 m)   Wt 151 lb (68.5 kg)   BMI 27.44 kg/m    Physical Exam  Constitutional: She is oriented to person, place, and time. She appears well-developed and well-nourished.  HENT:  Head: Normocephalic and atraumatic.  Eyes: Conjunctivae and EOM are normal.  Neck: Normal range of motion.  Cardiovascular: Normal rate, regular rhythm, normal heart sounds and intact distal pulses.  Pulmonary/Chest: Effort normal and breath sounds normal.  Abdominal: Soft. Bowel sounds are normal.  Lymphadenopathy:    She has no cervical adenopathy.  Neurological: She is alert and oriented to person, place, and time.  Skin: Skin is warm and dry. Capillary refill takes less than 2 seconds.  Psychiatric: She has a normal mood and affect. Her behavior is normal.  Nursing note and vitals reviewed.    Musculoskeletal Exam: C-spine thoracic lumbar spine good range of motion.  Shoulder joints elbow joints wrist joint MCPs PIPs DIPs been good range of motion with no synovitis.  She had tenderness on palpation of her right medial epicondyle.  Hip joints knee joints ankles MTPs PIPs DIPs were in good range of motion with no synovitis.  CDAI Exam: No CDAI exam completed.   Investigation: Findings:  12/23/2017 ANA 1: 1280 nucleolar, TSH 1.32, vitamin D 35.99, RF < 14, lipid panel WNL  Component     Latest Ref Rng & Units 12/23/2017  Cholesterol     0 - 200 mg/dL 629  Triglycerides     0.0 - 149.0 mg/dL 52.8  HDL Cholesterol     >39.00 mg/dL 41.32  VLDL     0.0 - 44.0 mg/dL 10.2  LDL (calc)     0 - 99 mg/dL 87  Total CHOL/HDL Ratio      2  NonHDL      99.00  ANA Pattern 1      NUCLEOLAR (A)  ANA Titer 1     titer > OR = 1:1280 (A)  TSH     0.35 - 4.50 uIU/mL 1.32  VITD     30.00 - 100.00 ng/mL 35.99  Anti Nuclear Antibody(ANA)     NEGATIVE POSITIVE (A)  RA Latex Turbid.     <14 IU/mL <14   Imaging: Xr Foot 2 Views Left  Result Date: 02/11/2018 No MTP narrowing or  erosive changes were noted.  Minimal PIP/DIP narrowing was noted.  No intertarsal joint space narrowing was noted. 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  Xr Foot 2 Views Right  Result Date: 02/11/2018 First MTP narrowing and minimal PIP/DIP narrowing was noted.  No erosive changes were noted.  No intertarsal joint space narrowing was noted. Impression: These findings are consistent with osteoarthritis of the foot.  Xr Hand 2 View Left  Result Date: 02/11/2018 No MCP PIP or DIP or intercarpal joint space narrowing was noted.  No erosive changes were noted. Impression: Unremarkable x-ray of the hand.  Xr Hand 2 View Right  Result Date: 02/11/2018 No MCP PIP or DIP or intercarpal joint space  narrowing was noted.  No erosive changes were noted. Impression: Unremarkable x-ray of the hand.  Xr Knee 3 View Left  Result Date: 02/11/2018 Moderate medial compartment narrowing was noted.  Mild patellofemoral narrowing was noted.  No chondrocalcinosis was noted. 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  Xr Knee 3 View Right  Result Date: 02/11/2018 Mild medial compartment narrowing was noted.  Mild patellofemoral narrowing was noted.  No chondrocalcinosis was noted. 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   Recent Labs: Lab Results  Component Value Date   WBC 3.7 (L) 12/29/2017   HGB 12.1 12/29/2017   PLT 221 12/29/2017   NA 139 12/23/2017   K 4.1 12/23/2017   CL 105 12/23/2017   CO2 30 12/23/2017   GLUCOSE 100 (H) 12/23/2017   BUN 8 12/23/2017   CREATININE 0.67 12/23/2017   BILITOT 0.6 12/23/2017   ALKPHOS 61 12/23/2017   AST 18 12/23/2017   ALT 17 12/23/2017   PROT 7.7 12/23/2017   ALBUMIN 4.3 12/23/2017   CALCIUM 9.3 12/23/2017   GFRAA  03/12/2009    >60        The eGFR has been calculated using the MDRD equation. This  calculation has not been validated in all clinical situations. eGFR's persistently <60 mL/min signify possible Chronic Kidney Disease.    Speciality Comments: No specialty comments available.  Procedures:  No procedures performed Allergies: Patient has no known allergies.   Assessment / Plan:     Visit Diagnoses: Positive ANA (antinuclear antibody) -patient complains of pain in multiple joints.  She had no synovitis on examination.  She states the joint pain and swelling has reduced since she has been taking Mobic.  She gives history of frequent swelling in her knee joints.  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  I will obtain additional labs to evaluate this further.  12/23/2017 ANA 1: 1280 nucleolar, TSH 1.32, vitamin D 35.99, RF < 14, lipid panel WNL - Plan: Urinalysis, Routine w reflex microscopic, Lupus Anticoagulant Eval w/Reflex  Pain in both hands -she gives history of pain and discomfort in her bilateral hands.  No synovitis was noted.  Plan: XR Hand 2 View Right, XR Hand 2 View Left, x-rays were unremarkable.  Angiotensin converting enzyme, Sedimentation rate  Chronic pain of both knees -she gives history of frequent swelling in her knee joints.  No warmth swelling or effusion was noted.  Plan: XR KNEE 3 VIEW RIGHT, XR KNEE 3 VIEW LEFT.  The x-ray showed mild to moderate medial compartment narrowing and chondromalacia patella.  Pain in both feet -she had no synovitis on examination.  She continues to have some discomfort in her feet.  Plan: XR Foot 2 Views Right, XR Foot 2 Views Left.  The x-rays showed mild osteoarthritic changes in the first MTP joint.  Other fatigue - Plan: CK, Serum protein electrophoresis with reflex, Glucose 6 phosphate dehydrogenase   Dysplasia of cervix, high grade CIN 2  History of neutropenia  Orders: Orders Placed This Encounter  Procedures  . XR Hand 2 View Right  . XR Hand 2 View Left  . XR KNEE 3 VIEW RIGHT  . XR KNEE 3 VIEW  LEFT  . XR Foot 2 Views Right  . XR Foot 2 Views Left  . Urinalysis, Routine w reflex microscopic  . CK  . Lupus Anticoagulant Eval w/Reflex  . Angiotensin converting enzyme  .  Serum protein electrophoresis with reflex  . Glucose 6 phosphate dehydrogenase  . Sedimentation rate   No orders of the defined types were placed in this encounter.   Face-to-face time spent with patient was 50 minutes. Greater than 50% of time was spent in counseling and coordination of care.  Follow-Up Instructions: Return for Positive ANA, polyarthralgia.   Pollyann Savoy, MD  Note - This record has been created using Animal nutritionist.  Chart creation errors have been sought, but may not always  have been located. Such creation errors do not reflect on  the standard of medical care.

## 2018-02-11 ENCOUNTER — Ambulatory Visit (INDEPENDENT_AMBULATORY_CARE_PROVIDER_SITE_OTHER): Payer: Managed Care, Other (non HMO)

## 2018-02-11 ENCOUNTER — Ambulatory Visit (INDEPENDENT_AMBULATORY_CARE_PROVIDER_SITE_OTHER): Payer: Self-pay

## 2018-02-11 ENCOUNTER — Encounter: Payer: Self-pay | Admitting: Rheumatology

## 2018-02-11 ENCOUNTER — Ambulatory Visit (INDEPENDENT_AMBULATORY_CARE_PROVIDER_SITE_OTHER): Payer: Managed Care, Other (non HMO) | Admitting: Rheumatology

## 2018-02-11 ENCOUNTER — Encounter (INDEPENDENT_AMBULATORY_CARE_PROVIDER_SITE_OTHER): Payer: Self-pay

## 2018-02-11 VITALS — BP 121/83 | HR 67 | Resp 14 | Ht 62.21 in | Wt 151.0 lb

## 2018-02-11 DIAGNOSIS — R5383 Other fatigue: Secondary | ICD-10-CM

## 2018-02-11 DIAGNOSIS — R768 Other specified abnormal immunological findings in serum: Secondary | ICD-10-CM

## 2018-02-11 DIAGNOSIS — M25561 Pain in right knee: Secondary | ICD-10-CM

## 2018-02-11 DIAGNOSIS — M79672 Pain in left foot: Secondary | ICD-10-CM

## 2018-02-11 DIAGNOSIS — M79642 Pain in left hand: Secondary | ICD-10-CM | POA: Diagnosis not present

## 2018-02-11 DIAGNOSIS — M25562 Pain in left knee: Secondary | ICD-10-CM

## 2018-02-11 DIAGNOSIS — G8929 Other chronic pain: Secondary | ICD-10-CM | POA: Diagnosis not present

## 2018-02-11 DIAGNOSIS — Z862 Personal history of diseases of the blood and blood-forming organs and certain disorders involving the immune mechanism: Secondary | ICD-10-CM

## 2018-02-11 DIAGNOSIS — M79641 Pain in right hand: Secondary | ICD-10-CM

## 2018-02-11 DIAGNOSIS — M79671 Pain in right foot: Secondary | ICD-10-CM

## 2018-02-11 DIAGNOSIS — N871 Moderate cervical dysplasia: Secondary | ICD-10-CM | POA: Diagnosis not present

## 2018-02-12 ENCOUNTER — Encounter: Payer: Self-pay | Admitting: Rheumatology

## 2018-02-12 NOTE — Progress Notes (Signed)
Please add aldolase

## 2018-02-15 LAB — PROTEIN ELECTROPHORESIS, SERUM, WITH REFLEX
ALBUMIN ELP: 4.4 g/dL (ref 3.8–4.8)
ALPHA 2: 0.5 g/dL (ref 0.5–0.9)
Alpha 1: 0.2 g/dL (ref 0.2–0.3)
BETA GLOBULIN: 0.5 g/dL (ref 0.4–0.6)
Beta 2: 0.4 g/dL (ref 0.2–0.5)
Gamma Globulin: 1.4 g/dL (ref 0.8–1.7)
Total Protein: 7.5 g/dL (ref 6.1–8.1)

## 2018-02-15 LAB — ANGIOTENSIN CONVERTING ENZYME: Angiotensin-Converting Enzyme: 109 U/L — ABNORMAL HIGH (ref 9–67)

## 2018-02-15 LAB — URINALYSIS, ROUTINE W REFLEX MICROSCOPIC
BILIRUBIN URINE: NEGATIVE
Glucose, UA: NEGATIVE
HGB URINE DIPSTICK: NEGATIVE
Ketones, ur: NEGATIVE
Leukocytes, UA: NEGATIVE
Nitrite: NEGATIVE
PH: 5.5 (ref 5.0–8.0)
Protein, ur: NEGATIVE
Specific Gravity, Urine: 1.017 (ref 1.001–1.03)

## 2018-02-15 LAB — SEDIMENTATION RATE: SED RATE: 6 mm/h (ref 0–20)

## 2018-02-15 LAB — RFLX HEXAGONAL PHASE CONFIRM: Hexagonal Phase Conf: NEGATIVE

## 2018-02-15 LAB — CK: Total CK: 209 U/L — ABNORMAL HIGH (ref 29–143)

## 2018-02-15 LAB — GLUCOSE 6 PHOSPHATE DEHYDROGENASE: G-6PDH: 15.6 U/g Hgb (ref 7.0–20.5)

## 2018-02-15 LAB — LUPUS ANTICOAGULANT EVAL W/ REFLEX
DRVVT: 40 s (ref <=45)
PTT-LA Screen: 69 s — ABNORMAL HIGH (ref <=40)

## 2018-02-15 NOTE — Progress Notes (Signed)
Is elevated ACE level.  [facility] and [facility].

## 2018-02-17 ENCOUNTER — Ambulatory Visit (HOSPITAL_COMMUNITY)
Admission: RE | Admit: 2018-02-17 | Discharge: 2018-02-17 | Disposition: A | Discharge status: Home / Self Care | Payer: 59 | Source: Ambulatory Visit | Attending: Rheumatology | Admitting: Rheumatology

## 2018-02-17 ENCOUNTER — Telehealth: Payer: Self-pay | Admitting: Rheumatology

## 2018-02-17 DIAGNOSIS — R899 Unspecified abnormal finding in specimens from other organs, systems and tissues: Secondary | ICD-10-CM | POA: Diagnosis not present

## 2018-02-17 NOTE — Telephone Encounter (Signed)
Patient called stating she was returning your call regarding her bloodwork. °

## 2018-02-17 NOTE — Telephone Encounter (Signed)
returned patient's call and advised patient to [facility] per lab note from Dr. Corliss Skainseveshwar. Patient verbalized understanding. Order has been placed for chest xray.

## 2018-02-18 NOTE — Progress Notes (Signed)
WNL

## 2018-03-01 NOTE — Progress Notes (Signed)
Office Visit Note  Patient: [name]             Date of Birth: 1975-07-24           MRN: [id number]             PCP: Orland Mustard, MD Referring: Orland Mustard, MD Visit Date: 03/15/2018 Occupation: @GUAROCC @  Subjective:  Joint pain.   History of Present Illness: Teresita Wojick is a 42 y.o. female with history of multiple arthralgias.  She states she continues to have pain and discomfort in her multiple joints which she describes in her elbows knee joints ankles and her hips.  She continues to have some discomfort in her hands.  She gives history of intermittent swelling in her hands and knee joints.  She states she had some rash which is resolved now.  She gives history of photosensitivity and oral ulcers.  Activities of Daily Living:  Patient reports morning stiffness for 2 minutes.   Patient Denies nocturnal pain.  Difficulty dressing/grooming: Denies Difficulty climbing stairs: Denies Difficulty getting out of chair: Denies Difficulty using hands for taps, buttons, cutlery, and/or writing: Reports  Review of Systems  Constitutional: Positive for fatigue. Negative for night sweats, weight gain and weight loss.  HENT: Positive for mouth sores and mouth dryness. Negative for trouble swallowing, trouble swallowing and nose dryness.   Eyes: Positive for dryness. Negative for pain, redness and visual disturbance.  Respiratory: Negative for cough, shortness of breath and difficulty breathing.   Cardiovascular: Negative for chest pain, palpitations, hypertension, irregular heartbeat and swelling in legs/feet.  Gastrointestinal: Positive for constipation. Negative for blood in stool and diarrhea.  Endocrine: Negative for increased urination.  Genitourinary: Negative for difficulty urinating and vaginal dryness.  Musculoskeletal: Positive for arthralgias, joint pain, joint swelling, morning stiffness and muscle tenderness. Negative for myalgias, muscle  weakness and myalgias.  Skin: Positive for rash and sensitivity to sunlight. Negative for color change, hair loss, skin tightness and ulcers.  Allergic/Immunologic: Negative for susceptible to infections.  Neurological: Negative for dizziness, numbness, memory loss, night sweats and weakness.  Hematological: Negative for bruising/bleeding tendency and swollen glands.  Psychiatric/Behavioral: Positive for sleep disturbance. Negative for depressed mood. The patient is not nervous/anxious.     PMFS History:  Patient Active Problem List   Diagnosis Date Noted  . Neutropenia (HCC) 01/04/2018  . ANA positive 01/04/2018  . 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 Abnormal uterine bleeding   . History of chicken pox   . Urinary tract infection 12/2014  . Vaginal Pap smear, abnormal     Family History  Problem Relation Age of Onset  . Diabetes Mother   . Hypertension Mother   . High Cholesterol Mother   . Breast cancer Paternal Grandmother   . Healthy Sister   . Healthy Brother   . Healthy Brother   . Healthy Son   . Healthy Son   . Healthy Son    Past Surgical History:  Procedure Laterality Date  . CESAREAN SECTION    . LEEP     Social History   Social History Narrative  . Not on file    Objective: Vital Signs: BP 114/75 (BP Location: Left Arm, Patient Position: Sitting, Cuff Size: Normal)   Pulse 80   Resp 14   Ht 5\' 2"  (1.575 m)   Wt 149 lb 6.4 oz (67.8 kg)   BMI 27.33 kg/m  Physical Exam  Constitutional: She is oriented to person, place, and time. She appears well-developed and well-nourished.  HENT:  Head: Normocephalic and atraumatic.  Eyes: Conjunctivae and EOM are normal.  Neck: Normal range of motion.  Cardiovascular: Normal rate, regular rhythm, normal heart sounds and intact distal pulses.  Pulmonary/Chest: Effort normal and breath sounds normal.  Abdominal: Soft. Bowel sounds are normal.    Lymphadenopathy:    She has no cervical adenopathy.  Neurological: She is alert and oriented to person, place, and time.  Skin: Skin is warm and dry. Capillary refill takes less than 2 seconds.  Psychiatric: She has a normal mood and affect. Her behavior is normal.  Nursing note and vitals reviewed.    Musculoskeletal Exam: C-spine thoracic lumbar spine good range of motion.  Shoulder joints elbow joints wrist joint MCPs PIPs DIPs been good range of motion with no synovitis.  Hip joints knee joints ankles MTPs PIPs were in good range of motion with no synovitis.  She is crepitus in bilateral knee joints without any warmth swelling or effusion.  CDAI Exam: CDAI Score: Not documented Patient Global Assessment: Not documented; Provider Global Assessment: Not documented Swollen: Not documented; Tender: Not documented Joint Exam   Not documented   There is currently no information documented on the homunculus. Go to the Rheumatology activity and complete the homunculus joint exam.  Investigation: Findings:  February 12, 2018 AVISE index 1.3 ANA 1: 5120 nucleolar, dsDNA negative, Smith negative, RNP negative, SSB negative, SCL 70-, Jo 1-, SSA positive, CB CAP negative, RF negative, anti-CCP negative, anticardiolipin negative, beta-2 GP 1-, thyroglobulin antibody positive, antithyroid peroxidase negative   Imaging: Dg Chest 2 View  Result Date: 02/18/2018 CLINICAL DATA:  Abnormal laboratory test. Neutropenia. ANA positive. EXAM: CHEST - 2 VIEW COMPARISON:  None. FINDINGS: The heart size and mediastinal contours are within normal limits. Both lungs are clear. The visualized skeletal structures are unremarkable. IMPRESSION: No active cardiopulmonary disease. Electronically Signed   By: Gaylyn Rong M.D.   On: 02/18/2018 09:38    Recent Labs: Lab Results  Component Value Date   WBC 3.7 (L) 12/29/2017   HGB 12.1 12/29/2017   PLT 221 12/29/2017   NA 139 12/23/2017   K 4.1 12/23/2017    CL 105 12/23/2017   CO2 30 12/23/2017   GLUCOSE 100 (H) 12/23/2017   BUN 8 12/23/2017   CREATININE 0.67 12/23/2017   BILITOT 0.6 12/23/2017   ALKPHOS 61 12/23/2017   AST 18 12/23/2017   ALT 17 12/23/2017   PROT 7.5 02/11/2018   ALBUMIN 4.3 12/23/2017   CALCIUM 9.3 12/23/2017   GFRAA  03/12/2009    >60        The eGFR has been calculated using the MDRD equation. This calculation has not been validated in all clinical situations. eGFR's persistently <60 mL/min signify possible Chronic Kidney Disease.  UA negative SPEP normal, lupus anticoagulant negative, CK 209, ACE 109, G6PD normal, ESR 6 February 17, 2018 chest x-ray normal  August 03/2018 AVISE lupus index 1.3 ANA 1: 5120 nucleolar, SSA positive, thyroglobulin antibody positive  Speciality Comments: No specialty comments available.  Procedures:  No procedures performed Allergies: Patient has no known allergies.   Assessment / Plan:     Visit Diagnoses: Autoimmune disease (HCC) - Positive ANA 1: 5120 nucleolar, positive SSA a, neutropenia, history of joint pain and swelling.  She gives history of photosensitivity and oral ulcers.  The rash on her arm has resolved.  I do not  see any synovitis on examination.  She has significant sicca symptoms.  Different treatment options and their side effects were discussed at length.  I discussed possible use of Plaquenil.  Indications side effects contraindications were reviewed.  She wanted to proceed with Plaquenil.  Her dose will be Plaquenil 200 mg 1 tablet p.o. twice daily Monday through Friday.  Use of sunscreen was discussed.  She will need baseline eye exam then I yearly eye exam after that.  She has been also advised to get pneumococcal and flu vaccine.  She will need labs in 1 month and then every 3 months to monitor for drug toxicity.  Patient was counseled on the purpose, proper use, and adverse effects of hydroxychloroquine including nausea/diarrhea, skin rash, headaches, and sun  sensitivity.  Discussed importance of annual eye exams while on hydroxychloroquine to monitor to ocular toxicity and discussed importance of frequent laboratory monitoring.  Provided patient with eye exam form for baseline ophthalmologic exam.  Provided patient with educational materials on hydroxychloroquine and answered all questions.  Patient consented to hydroxychloroquine.  Will upload consent in the media tab.      Primary osteoarthritis of both knees-weight loss diet and exercise was discussed.  Chondromalacia of patella, unspecified laterality  Primary osteoarthritis of both feet-proper fitting shoes were discussed.  Dysplasia of cervix, high grade CIN 2   Orders: Orders Placed This Encounter  Procedures  . CBC with Differential/Platelet  . COMPLETE METABOLIC PANEL WITH GFR   Meds ordered this encounter  Medications  . hydroxychloroquine (PLAQUENIL) 200 MG tablet    Sig: Take 200mg  by mouth twice daily, Monday-Friday only. None on Saturday or Sunday.    Dispense:  40 tablet    Refill:  0    Face-to-face time spent with patient was 30 minutes. Greater than 50% of time was spent in counseling and coordination of care.  Follow-Up Instructions: Return in about 3 months (around 06/14/2018) for Autoimmune disease.   Pollyann Savoy, MD  Note - This record has been created using Animal nutritionist.  Chart creation errors have been sought, but may not always  have been located. Such creation errors do not reflect on  the standard of medical care.

## 2018-03-15 ENCOUNTER — Encounter: Payer: Self-pay | Admitting: Rheumatology

## 2018-03-15 ENCOUNTER — Ambulatory Visit (INDEPENDENT_AMBULATORY_CARE_PROVIDER_SITE_OTHER): Payer: Managed Care, Other (non HMO) | Admitting: Rheumatology

## 2018-03-15 VITALS — BP 114/75 | HR 80 | Resp 14 | Ht 62.0 in | Wt 149.4 lb

## 2018-03-15 DIAGNOSIS — N871 Moderate cervical dysplasia: Secondary | ICD-10-CM

## 2018-03-15 DIAGNOSIS — D8989 Other specified disorders involving the immune mechanism, not elsewhere classified: Secondary | ICD-10-CM

## 2018-03-15 DIAGNOSIS — M19071 Primary osteoarthritis, right ankle and foot: Secondary | ICD-10-CM | POA: Diagnosis not present

## 2018-03-15 DIAGNOSIS — M359 Systemic involvement of connective tissue, unspecified: Secondary | ICD-10-CM

## 2018-03-15 DIAGNOSIS — M2240 Chondromalacia patellae, unspecified knee: Secondary | ICD-10-CM

## 2018-03-15 DIAGNOSIS — M19072 Primary osteoarthritis, left ankle and foot: Secondary | ICD-10-CM

## 2018-03-15 DIAGNOSIS — M170 Bilateral primary osteoarthritis of knee: Secondary | ICD-10-CM | POA: Diagnosis not present

## 2018-03-15 DIAGNOSIS — Z79899 Other long term (current) drug therapy: Secondary | ICD-10-CM

## 2018-03-15 MED ORDER — HYDROXYCHLOROQUINE SULFATE 200 MG PO TABS: ORAL_TABLET | ORAL | 0 refills | Status: DC

## 2018-03-15 NOTE — Patient Instructions (Addendum)
Hydroxychloroquine tablets What is this medicine? HYDROXYCHLOROQUINE (hye drox ee KLOR oh kwin) is used to treat rheumatoid arthritis and systemic lupus erythematosus. It is also used to treat malaria. This medicine may be used for other purposes; ask your health care provider or pharmacist if you have questions. COMMON BRAND NAME(S): Plaquenil, Quineprox What should I tell my health care provider before I take this medicine? They need to know if you have any of these conditions: -diabetes -eye disease, vision problems -G6PD deficiency -history of blood diseases -history of irregular heartbeat -if you often drink alcohol -kidney disease -liver disease -porphyria -psoriasis -seizures -an unusual or allergic reaction to chloroquine, hydroxychloroquine, other medicines, foods, dyes, or preservatives -pregnant or trying to get pregnant -breast-feeding How should I use this medicine? Take this medicine by mouth with a glass of water. Follow the directions on the prescription label. Avoid taking antacids within 4 hours of taking this medicine. It is best to separate these medicines by at least 4 hours. Do not cut, crush or chew this medicine. You can take it with or without food. If it upsets your stomach, take it with food. Take your medicine at regular intervals. Do not take your medicine more often than directed. Take all of your medicine as directed even if you think you are better. Do not skip doses or stop your medicine early. Talk to your pediatrician regarding the use of this medicine in children. While this drug may be prescribed for selected conditions, precautions do apply. Overdosage: If you think you have taken too much of this medicine contact a poison control center or emergency room at once. NOTE: This medicine is only for you. Do not share this medicine with others. What if I miss a dose? If you miss a dose, take it as soon as you can. If it is almost time for your next dose,  take only that dose. Do not take double or extra doses. What may interact with this medicine? Do not take this medicine with any of the following medications: -cisapride -dofetilide -dronedarone -live virus vaccines -penicillamine -pimozide -thioridazine -ziprasidone This medicine may also interact with the following medications: -ampicillin -antacids -cimetidine -cyclosporine -digoxin -medicines for diabetes, like insulin, glipizide, glyburide -medicines for seizures like carbamazepine, phenobarbital, phenytoin -mefloquine -methotrexate -other medicines that prolong the QT interval (cause an abnormal heart rhythm) -praziquantel This list may not describe all possible interactions. Give your health care provider a list of all the medicines, herbs, non-prescription drugs, or dietary supplements you use. Also tell them if you smoke, drink alcohol, or use illegal drugs. Some items may interact with your medicine. What should I watch for while using this medicine? Tell your doctor or healthcare professional if your symptoms do not start to get better or if they get worse. Avoid taking antacids within 4 hours of taking this medicine. It is best to separate these medicines by at least 4 hours. Tell your doctor or health care professional right away if you have any change in your eyesight. Your vision and blood may be tested before and during use of this medicine. This medicine can make you more sensitive to the sun. Keep out of the sun. If you cannot avoid being in the sun, wear protective clothing and use sunscreen. Do not use sun lamps or tanning beds/booths. What side effects may I notice from receiving this medicine? Side effects that you should report to your doctor or health care professional as soon as possible: -allergic reactions like skin rash,   itching or hives, swelling of the face, lips, or tongue -changes in vision -decreased hearing or ringing of the ears -redness,  blistering, peeling or loosening of the skin, including inside the mouth -seizures -sensitivity to light -signs and symptoms of a dangerous change in heartbeat or heart rhythm like chest pain; dizziness; fast or irregular heartbeat; palpitations; feeling faint or lightheaded, falls; breathing problems -signs and symptoms of liver injury like dark yellow or brown urine; general ill feeling or flu-like symptoms; light-colored stools; loss of appetite; nausea; right upper belly pain; unusually weak or tired; yellowing of the eyes or skin -signs and symptoms of low blood sugar such as feeling anxious; confusion; dizziness; increased hunger; unusually weak or tired; sweating; shakiness; cold; irritable; headache; blurred vision; fast heartbeat; loss of consciousness -uncontrollable head, mouth, neck, arm, or leg movements Side effects that usually do not require medical attention (report to your doctor or health care professional if they continue or are bothersome): -anxious -diarrhea -dizziness -hair loss -headache -irritable -loss of appetite -nausea, vomiting -stomach pain This list may not describe all possible side effects. Call your doctor for medical advice about side effects. You may report side effects to FDA at 1-800-FDA-1088. Where should I keep my medicine? Keep out of the reach of children. In children, this medicine can cause overdose with small doses. Store at room temperature between 15 and 30 degrees C (59 and 86 degrees F). Protect from moisture and light. Throw away any unused medicine after the expiration date. NOTE: This sheet is a summary. It may not cover all possible information. If you have questions about this medicine, talk to your doctor, pharmacist, or health care provider.  2018 Elsevier/Gold Standard (2016-02-06 14:16:15)   Please get following vaccines from your primary care physician- Pneumococcal vaccine Flu vaccine You cannot take any live  vaccines.  Please get baseline eye examination by ophthalmologist and then you will require yearly eye examination.  Standing Labs We placed an order today for your standing lab work.    Please come back and get your standing labs in 1 month and then every 3 months.  We have open lab Monday through Friday from 8:30-11:30 AM and 1:30-4:00 PM  at the office of Dr. Pollyann Savoy.   You may experience shorter wait times on Monday and Friday afternoons. The office is located at [address], [address], [place], [place] [address] No appointment is necessary.   Labs are drawn by [facility].  You may receive a bill from [facility] for your lab work. If you have any questions regarding directions or hours of operation,  please call [phone].

## 2018-03-25 ENCOUNTER — Encounter: Payer: Self-pay | Admitting: Family Medicine

## 2018-03-25 ENCOUNTER — Ambulatory Visit (INDEPENDENT_AMBULATORY_CARE_PROVIDER_SITE_OTHER): Payer: 59 | Admitting: Family Medicine

## 2018-03-25 DIAGNOSIS — Z23 Encounter for immunization: Secondary | ICD-10-CM

## 2018-03-25 DIAGNOSIS — M3500 Sicca syndrome, unspecified: Secondary | ICD-10-CM | POA: Insufficient documentation

## 2018-03-25 DIAGNOSIS — D8989 Other specified disorders involving the immune mechanism, not elsewhere classified: Secondary | ICD-10-CM | POA: Diagnosis not present

## 2018-03-25 MED ORDER — GABAPENTIN 300 MG PO CAPS: 300.0000 mg | ORAL_CAPSULE | Freq: Three times a day (TID) | ORAL | 0 refills | Status: DC

## 2018-03-25 MED ORDER — FOLIC ACID 1 MG PO TABS: 1.0000 mg | ORAL_TABLET | Freq: Every day | ORAL | 3 refills | Status: DC

## 2018-03-25 MED ORDER — MELOXICAM 15 MG PO TABS: 15.0000 mg | ORAL_TABLET | Freq: Every day | ORAL | 0 refills | Status: DC

## 2018-03-25 NOTE — Progress Notes (Signed)
Patient: [name] MRN: [id number] DOB: Aug 30, 1975 PCP: Orland Mustard, MD      Subjective:  Chief Complaint  Patient presents with  . Follow-up    HPI: The patient is a 42 y.o. female who presents today for follow up after I saw her 3 months ago and work up revealed a strong positive ANA. I referred her to rheumatology where she was just started on Plaquanil (strong sicca symptoms). Recommended pnumonia vaccine which we will do today. She is still complaining of fatigue and joint pain. Her sciatica has resolved with the gabapentin. She also has some vague symptoms of constipation. She is doing somewhat better.   Needs pap smear.   Review of Systems  Constitutional: Positive for fatigue.  Eyes: Positive for pain, itching and visual disturbance.       Saw eye doctor yesterday-pt will be getting glasses  Respiratory: Negative for shortness of breath.   Cardiovascular: Negative for chest pain.  Gastrointestinal: Negative for abdominal pain and nausea.  Musculoskeletal: Positive for arthralgias and back pain. Negative for neck pain.       C/o sharp pain in left knee  Neurological: Positive for headaches.       C/o Daily headaches  Psychiatric/Behavioral: The patient is not nervous/anxious.     Allergies Patient has No Known Allergies.  Past Medical History Patient  has a past medical history of Abnormal uterine bleeding, History of chicken pox, Urinary tract infection (12/2014), and Vaginal Pap smear, abnormal.  Surgical History Patient  has a past surgical history that includes Cesarean section and LEEP.  Family History Pateint's family history includes Breast cancer in her paternal grandmother; Diabetes in her mother; Healthy in her brother, brother, sister, son, son, and son; High Cholesterol in her mother; Hypertension in her mother.  Social History Patient  reports that she has been smoking. She has never used smokeless tobacco. She reports that she drinks  about 2.0 standard drinks of alcohol per week. She reports that she does not use drugs.    Objective: Vitals:   03/25/18 0808  BP: 114/68  Pulse: 64  Temp: 98.2 F (36.8 C)  TempSrc: Oral  SpO2: 100%  Weight: 150 lb 6.4 oz (68.2 kg)  Height: 5\' 2"  (1.575 m)    Body mass index is 27.51 kg/m.  Physical Exam  Constitutional: She appears well-developed and well-nourished.  HENT:  Right Ear: External ear normal.  Left Ear: External ear normal.  Mouth/Throat: Oropharynx is clear and moist.  Neck: Normal range of motion. Neck supple.  Cardiovascular: Normal rate, regular rhythm and normal heart sounds.  Pulmonary/Chest: Effort normal and breath sounds normal.  Abdominal: Soft. Bowel sounds are normal.  Musculoskeletal:  TTP over bilateral shoulders, elbows, hips, knees.   Lymphadenopathy:    She has no cervical adenopathy.  Vitals reviewed.      Assessment/plan: 1. Autoimmune disorder (HCC) Pneumonia shot today. On plaquenil with eye exam done and followed by rheumatology.   -I wonder if she has some fibromyalgia with all of her joint pain and other symptoms especially fatigue. Discussed this could all be from her auto immune disease, but I also think she has a strong history and exam for fibromyalgia. Handout given for this. Discussed we could do trial of cymbalta, but that exercise/yoga are some of the best forms of treatment. She is going to wait on the cymbalta and see if the plaquanil helps her. Also want her to start vitamin D daily and am putting her on folic  acid to just see if it helps her auto immune stuff.   Osteoarthritis of her knees: besides the nsaids prn, can do trial of tumeric. Discussed must take with the black pepper and all the otc vitamins have this in it. Again, exercise will be the best thing she can do.   She will f/u for pap smear and let me know about the fibromyalgia and treatment. Can also discuss with Dr. Corliss Skains at her follow up with her.      Return if symptoms worsen or fail to improve, for pap and flu shot .   Orland Mustard, MD [facility]   03/25/2018

## 2018-03-25 NOTE — Patient Instructions (Addendum)
1) start 1000IU/vitamin D over the counter daily 2) can try tumeric pills over the counter for arthritis in knees/inflammation.  3) sending in a folic acid pill to possibly help with nerve irritation.    Sndrome de Librarian, academicdolor miofascial y fibromialgia (Myofascial Pain Syndrome and Fibromyalgia) El sndrome de dolor miofascial y la fibromialgia son trastornos del Engineer, miningdolor. Este dolor puede sentirse, principalmente, en los msculos.  Sndrome de dolor miofascial: ? Siempre cursa con puntos neurlgicos o puntos dolorosos a la palpacin en el msculo, que causarn dolor ante la compresin. El dolor puede aparecer y Geneticist, moleculardesaparecer. ? Generalmente, afecta el cuello, la parte superior de la espalda y las zonas de los hombros. El dolor suele irradiarse a los brazos y Donnalas manos.  Fibromialgia: ? Cursa con dolores musculares y dolor a la palpacin que aparecen y desaparecen. ? Suele asociarse con la fatiga y los trastornos del sueo. ? Cursa con puntos neurlgicos. ? Suele ser de larga duracin (crnica), pero no es potencialmente mortal. La fibromialgia y el dolor miofascial no son lo mismo. Sin embargo, suelen presentarse juntos. Si tiene ambas afecciones, pueden intensificarse entre s. Ambas afecciones son frecuentes y pueden causar bastante dolor y fatiga como para dificultar las actividades cotidianas. CAUSAS No se conocen las causas exactas de la fibromialgia y Chief Technology Officerel dolor miofascial. Las personas con determinados tipos de genes pueden tener ms probabilidades de Environmental education officerdesarrollar fibromialgia. Algunos factores pueden desencadenar ambas afecciones, por ejemplo:  Dolores de columna.  Artritis.  Lesin grave (traumatismo) y otros factores estresantes fsicos.  Estar bajo mucho estrs.  Una enfermedad. SIGNOS Y SNTOMAS Fibromialgia El sntoma principal de la fibromialgia es el dolor ampliamente distribuido y Chief Technology Officerel dolor a la palpacin de los msculos. Esto puede variar con Allied Waste Industriesel tiempo. A veces, el dolor se  describe como punzante, fulgurante o urente. Tambin puede tener hormigueo o adormecimiento, problemas para dormir y Management consultantfatiga. Tal vez se despierte cansado y atontado (disfuncin cognitiva). Otros sntomas pueden ser los siguientes:  Problemas de intestino y vejiga.  Dolores de Turkmenistancabeza.  Tiene problemas visuales.  Problemas con los aromas y los ruidos.  Depresin o cambios en el estado de nimo.  Menstruaciones dolorosas (dismenorrea).  Sequedad de la piel o los ojos. Sndrome de Rockwell Automationdolor miofascial Los sntomas del sndrome de dolor miofascial incluyen lo siguiente:  Bandas musculares tensas y fibrosas.  Sensaciones molestas en las zonas musculares, por ejemplo: ? Dolor. ? Calambres. ? Quemazn. ? Entumecimiento. ? Hormigueo. ? Debilidad muscular.  Dificultad para mover libremente determinados msculos (amplitud de movimiento). DIAGNSTICO No hay estudios especficos para diagnosticar la fibromialgia o el sndrome de dolor miofascial. Ambas afecciones pueden ser difciles de diagnosticar porque tienen sntomas que son frecuentes en muchas otras enfermedades. El mdico puede sospechar la presencia de una o ambas afecciones en funcin de los sntomas y la historia clnica. Tambin Civil engineer, contractingle realizar un examen fsico. La clave para diagnosticar la fibromialgia es Surveyor, miningtener dolor, fatiga y otros sntomas durante ms de tres meses que otra enfermedad no explica. La clave para diagnosticar el sndrome de dolor miofascial es Clinical research associateencontrar los puntos neurlgicos en los msculos que son dolorosos a la palpacin y Teaching laboratory techniciancausan dolor en cualquier otra parte del cuerpo (dolor referido). TRATAMIENTO El tratamiento para la fibromialgia y el sndrome de dolor miofascial a menudo requiere la participacin de un equipo de mdicos. Generalmente, comienza con el mdico de cabecera y un fisioterapeuta. Es posible que tambin le resulte til trabajar con profesionales alternativos, como masoterapeutas o acupunturistas. El  tratamiento para la  fibromialgia puede incluir medicamentos, entre ellos, antiinflamatorios no esteroides (AINE) junto con otros frmacos. El tratamiento para el dolor miofascial tambin puede incluir lo siguiente:  Antiinflamatorios no esteroides.  Relajacin y elongacin de los msculos.  Inyecciones en los puntos neurlgicos.  Tratamientos con ondas de sonido (ultrasonido) para [employer]. INSTRUCCIONES PARA EL CUIDADO EN EL HOGAR  Tome los medicamentos solamente como se lo haya indicado el mdico.  Haga ejercicio como se lo haya indicado el fisioterapeuta o su mdico.  Tratar de evitar las situaciones estresantes.  Practique tcnicas de relajacin para mantener el estrs bajo control. Tal vez deba intentar lo siguiente: ? Biorretroalimentacin. ? Formacin de imgenes visuales. ? Hipnosis. ? Relajacin muscular. ? Yoga. ? Meditacin.  Hable con el [employer] tratamientos alternativos, por ejemplo, acupuntura o [place].  Lleve un estilo de vida saludable. Esto incluye consumir una dieta saludable y dormir lo suficiente.  Considere la posibilidad de Advertising account planner en un grupo de apoyo.  No realice actividades que le generen tensin o sobrecarga muscular. Eso incluye hacer movimientos repetitivos y levantar objetos pesados. SOLICITE ATENCIN MDICA SI:  Aparecen nuevos sntomas.  Los sntomas empeoran.  Los [employer] causan [employer].  Tiene dificultad para dormir.  La afeccin le causa depresin o ansiedad. Irven Shelling MS INFORMACIN  Asociacin Nacional de Fibromialgia (National Fibromyalgia Association): www.fmaware.org  Fundacin contra la Artritis (Arthritis Foundation): www.arthritis.org  Asociacin Estadounidense del IT sales professional (American Chronic Pain Association): GumSearch.nl Esta informacin no tiene Theme park manager el consejo del mdico. Asegrese de hacerle al mdico cualquier pregunta que  tenga. Document Released: 06/23/2005 Document Revised: 10/15/2015 Document Reviewed: 03/29/2014 Elsevier Interactive Patient Education  [employer].   -after a few months on plaquanil we could trial of treatment for fibromyalgia. Your symptoms may all be the autoimmune issue, but you have some similar symptoms for fibromyalgia. I would start you on cymbalta. Just let me know if you are interested in this.

## 2018-03-31 ENCOUNTER — Ambulatory Visit: Payer: Managed Care, Other (non HMO) | Admitting: Rheumatology

## 2018-04-16 ENCOUNTER — Other Ambulatory Visit: Payer: Self-pay

## 2018-04-16 DIAGNOSIS — Z79899 Other long term (current) drug therapy: Secondary | ICD-10-CM

## 2018-04-16 LAB — CBC WITH DIFFERENTIAL/PLATELET
BASOS ABS: 91 {cells}/uL (ref 0–200)
Basophils Relative: 2.4 %
EOS ABS: 72 {cells}/uL (ref 15–500)
Eosinophils Relative: 1.9 %
HCT: 36.7 % (ref 35.0–45.0)
HEMOGLOBIN: 12.5 g/dL (ref 11.7–15.5)
Lymphs Abs: 1607 cells/uL (ref 850–3900)
MCH: 31.3 pg (ref 27.0–33.0)
MCHC: 34.1 g/dL (ref 32.0–36.0)
MCV: 91.8 fL (ref 80.0–100.0)
MPV: 9.7 fL (ref 7.5–12.5)
Monocytes Relative: 13.8 %
NEUTROS ABS: 1505 {cells}/uL (ref 1500–7800)
NEUTROS PCT: 39.6 %
Platelets: 250 10*3/uL (ref 140–400)
RBC: 4 10*6/uL (ref 3.80–5.10)
RDW: 12.3 % (ref 11.0–15.0)
TOTAL LYMPHOCYTE: 42.3 %
WBC mixed population: 524 cells/uL (ref 200–950)
WBC: 3.8 10*3/uL (ref 3.8–10.8)

## 2018-04-16 LAB — COMPLETE METABOLIC PANEL WITH GFR
AG Ratio: 1.4 (calc) (ref 1.0–2.5)
ALKALINE PHOSPHATASE (APISO): 61 U/L (ref 33–115)
ALT: 21 U/L (ref 6–29)
AST: 28 U/L (ref 10–30)
Albumin: 4.3 g/dL (ref 3.6–5.1)
BUN: 10 mg/dL (ref 7–25)
CO2: 31 mmol/L (ref 20–32)
Calcium: 9.8 mg/dL (ref 8.6–10.2)
Chloride: 103 mmol/L (ref 98–110)
Creat: 0.84 mg/dL (ref 0.50–1.10)
GFR, EST AFRICAN AMERICAN: 101 mL/min/{1.73_m2} (ref >=60)
GFR, Est Non African American: 88 mL/min/{1.73_m2} (ref >=60)
GLUCOSE: 88 mg/dL (ref 65–99)
Globulin: 3 g/dL (calc) (ref 1.9–3.7)
Potassium: 4.5 mmol/L (ref 3.5–5.3)
Sodium: 139 mmol/L (ref 135–146)
Total Bilirubin: 0.4 mg/dL (ref 0.2–1.2)
Total Protein: 7.3 g/dL (ref 6.1–8.1)

## 2018-04-20 ENCOUNTER — Other Ambulatory Visit: Payer: Self-pay | Admitting: Rheumatology

## 2018-04-20 MED ORDER — HYDROXYCHLOROQUINE SULFATE 200 MG PO TABS: ORAL_TABLET | ORAL | 0 refills | Status: DC

## 2018-04-20 NOTE — Telephone Encounter (Signed)
Last Visit: 03/15/18 Next Visit: 06/17/18 Labs: 04/16/18 WNL Have not received baseline PLQ eye exam.  Left message to advise patient we are in need on PLQ eye exam.  Okay to refill 30 day supply PLQ?

## 2018-04-20 NOTE — Telephone Encounter (Signed)
Patient called requesting prescription refill of Plaquenil to be sent to [facility] at [address] [facility] in [place].

## 2018-04-20 NOTE — Telephone Encounter (Signed)
Ok to refill 30-day supply

## 2018-05-19 ENCOUNTER — Telehealth: Payer: Self-pay | Admitting: Rheumatology

## 2018-05-19 MED ORDER — HYDROXYCHLOROQUINE SULFATE 200 MG PO TABS: ORAL_TABLET | ORAL | 2 refills | Status: DC

## 2018-05-19 NOTE — Telephone Encounter (Signed)
Last Visit: 03/15/18 Next Visit: 06/17/18 Labs: 04/16/18 WNL PLQ Eye Exam: 03/31/18 WNL  Okay to refill per Dr. Corliss Skainseveshwar

## 2018-05-19 NOTE — Telephone Encounter (Signed)
[facility] [address]

## 2018-06-07 NOTE — Progress Notes (Signed)
Office Visit Note  Patient: [name]             Date of Birth: 1976/04/09           MRN: [id number]             PCP: Orland Mustard, MD Referring: Orland Mustard, MD Visit Date: 06/17/2018 Occupation: @GUAROCC @  Subjective:  Pain in both hands     History of Present Illness: [name] is a 42 y.o. female with history of autoimmune disease. She is taking Plaquenil 200 mg twice daily Monday through Friday started on 03/15/2018.  Since starting she has noticed improvement in her fatigue and joint pain.  She reports missing a few doses and notices an increase in fatigue and pain. She also started taking restasis which has improved dry eye but states that it burns. Her knees and feet are still bothersome but has noticed improvement.  Also reports pain in her trapezius area. Reports numbness in fingers and feet has improvement.  She denies a history carpal tunnel.  She has daytime and nighttime symptoms.  She denies any overuse activities.  She has noticed changes in skin color on her trunk and shoulders.  She denies any facial rashes.  She has oral ulcerations intermittently.   Activities of Da Patient reports morning stiffness for 1  minute.   Patient Denies nocturnal pain.  Difficulty dressing/grooming: Denies Difficulty climbing stairs: Denies Difficulty getting out of chair: Denies Difficulty using hands for taps, buttons, cutlery, and/or writing: Denies  Review of Systems  Constitutional: Positive for fatigue.  HENT: Positive for mouth sores and mouth dryness. Negative for nose dryness.   Eyes: Positive for dryness. Negative for pain and visual disturbance.  Respiratory: Negative for cough, hemoptysis, shortness of breath and difficulty breathing.   Cardiovascular: Negative for chest pain, palpitations, hypertension and swelling in legs/feet.  Gastrointestinal: Positive for constipation. Negative for blood in stool and diarrhea.  Endocrine: Negative for  increased urination.  Genitourinary: Negative for painful urination.  Musculoskeletal: Positive for arthralgias, joint pain and morning stiffness. Negative for joint swelling, myalgias, muscle weakness, muscle tenderness and myalgias.  Skin: Positive for color change and rash. Negative for pallor, hair loss, nodules/bumps, skin tightness, ulcers and sensitivity to sunlight.  Allergic/Immunologic: Negative for susceptible to infections.  Neurological: Positive for numbness (in bilateral hands). Negative for dizziness, headaches and weakness.  Hematological: Negative for swollen glands.  Psychiatric/Behavioral: Positive for sleep disturbance. Negative for depressed mood. The patient is not nervous/anxious.     PMFS History:  Patient Active Problem List   Diagnosis Date Noted  . Autoimmune disorder (HCC) 03/25/2018  . Neutropenia (HCC) 01/04/2018  . ANA positive 01/04/2018  . 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 Abnormal uterine bleeding   . History of chicken pox   . Urinary tract infection 12/2014  . Vaginal Pap smear, abnormal     Family History  Problem Relation Age of Onset  . Diabetes Mother   . Hypertension Mother   . High Cholesterol Mother   . Breast cancer Paternal Grandmother   . Healthy Sister   . Healthy Brother   . Healthy Brother   . Healthy Son   . Healthy Son   . Healthy Son    Past Surgical History:  Procedure Laterality Date  . CESAREAN SECTION    . LEEP     Social History   Social History Narrative  .  Not on file   Immunization History  Administered Date(s) Administered  . Pneumococcal Polysaccharide-23 03/25/2018  . Tdap 11/23/2014     Objective: Vital Signs: BP 106/75 (BP Location: Left Arm, Patient Position: Sitting, Cuff Size: Normal)   Pulse 66   Resp 14   Ht 5\' 1"  (1.549 m)   Wt 149 lb 12.8 oz (67.9 kg)   LMP 06/17/2018   BMI 28.30 kg/m    Physical Exam Vitals signs and  nursing note reviewed.  Constitutional:      Appearance: She is well-developed.  HENT:     Head: Normocephalic and atraumatic.     Comments: 1 oral ulceration present. No nasal ulcerations.  No parotid swelling.  Eyes:     Conjunctiva/sclera: Conjunctivae normal.  Neck:     Musculoskeletal: Normal range of motion.  Cardiovascular:     Rate and Rhythm: Normal rate and regular rhythm.     Heart sounds: Normal heart sounds.  Pulmonary:     Effort: Pulmonary effort is normal.     Breath sounds: Normal breath sounds.  Abdominal:     General: Bowel sounds are normal.     Palpations: Abdomen is soft.  Lymphadenopathy:     Cervical: No cervical adenopathy.  Skin:    General: Skin is warm and dry.     Capillary Refill: Capillary refill takes less than 2 seconds.     Comments: No malar rash noted. Hypopigmented areas noted on trunk and chest.  Neurological:     Mental Status: She is alert and oriented to person, place, and time.  Psychiatric:        Behavior: Behavior normal.      Musculoskeletal Exam: C-spine, thoracic spine, and lumbar spine good ROM.  No midline spinal tenderness.  No SI joint tenderness.  Shoulder joints, elbow joints, wrist joints, MCPs, PIPs, and DIPs good ROM with no synovitis. Complete fist formation bilaterally. Hip joints, knee joints, ankle joints, MTPs, PIPs, and DIPs good ROM with no synovitis.  No warmth or effusion of knee joints.  Bilateral knee crepitus.  No tenderness or swelling of ankle joints.  No tenderness of trochanteric bursa bilaterally.   CDAI Exam: CDAI Score: Not documented Patient Global Assessment: Not documented; Provider Global Assessment: Not documented Swollen: Not documented; Tender: Not documented Joint Exam   Not documented   There is currently no information documented on the homunculus. Go to the Rheumatology activity and complete the homunculus joint exam.  Investigation: No additional findings.  Imaging: No results  found.  Recent Labs: Lab Results  Component Value Date   WBC 3.8 04/16/2018   HGB 12.5 04/16/2018   PLT 250 04/16/2018   NA 139 04/16/2018   K 4.5 04/16/2018   CL 103 04/16/2018   CO2 31 04/16/2018   GLUCOSE 88 04/16/2018   BUN 10 04/16/2018   CREATININE 0.84 04/16/2018   BILITOT 0.4 04/16/2018   ALKPHOS 61 12/23/2017   AST 28 04/16/2018   ALT 21 04/16/2018   PROT 7.3 04/16/2018   ALBUMIN 4.3 12/23/2017   CALCIUM 9.8 04/16/2018   GFRAA 101 04/16/2018    Speciality Comments: PLQ Eye Exam: 03/31/18 WNL @ [facility] Follow up in 1 year  Procedures:  No procedures performed Allergies: Patient has no known allergies.       Assessment / Plan:     Visit Diagnoses: Autoimmune disease (HCC) - Positive ANA 1: 5120 nucleolar, positive SSA a, neutropenia, history of joint pain and swelling.  She gives  history of photosensitivity and oral ulcers: She has noticed a significant improvement since starting on Plaquenil 200 mg 1 tablet by mouth BID M-F.  She started PLQ on 03/15/18 and has been tolerating it well.  She has no synovitis on exam.  She continues to have oral ulcerations.  She has sicca symptoms and uses Restasis.  We discussed trying to use OTC biotene products.  She has no malar rash on exam.  She has hypopigmented regions on her trunk.  We discussed trying selenium sulfate shampoo and to follow up with a dermatologist.  A refill of PLQ was sent to the pharmacy.  She was advised to notify us if she develops any new or worsening symptoms.  She will follow up in 5 months.- Plan: hydroxychloroquine (PLAQUENIL) 200 MG tablet  High risk medication use - PLQ 200 mg BID M-F: started on 03/15/18. eye exam: 03/31/2018. Most recent CBC/CMP within normal limits on 04/16/2018.  Next CBC/CMP due January and then every 5 months.  Standing orders are in place.  Seasonsal influenza vaccine was recommended today.  Primary osteoarthritis of both knees: No warmth or effusion noted.  Good ROM  with no discomfort.  She has bilateral knee crepitus. She has occasional discomfort in both knee joints.   Chondromalacia of patella, bilateral-She has bilateral knee crepitus.   Primary osteoarthritis of both feet: She has occasional discomfort in both feet.  She wears proper fitting shoes.   Dysplasia of cervix, high grade CIN 2   Orders: No orders of the defined types were placed in this encounter.  Meds ordered this encounter  Medications  . hydroxychloroquine (PLAQUENIL) 200 MG tablet    Sig: Take 200mg  by mouth twice daily, Monday-Friday only. None on Saturday or Sunday.    Dispense:  120 tablet    Refill:  0     Follow-Up Instructions: Return in about 5 months (around 11/16/2018) for Autoimmune Disease.   Gearldine Bienenstock, PA-C   I examined and evaluated the patient with Sherron Ales PA.  Patient is doing better on Plaquenil.  She had no synovitis on my examination.  She will continue on current dose of Plaquenil.  The plan of care was discussed as noted above.  Pollyann Savoy, MD  Note - This record has been created using Animal nutritionist.  Chart creation errors have been sought, but may not always  have been located. Such creation errors do not reflect on  the standard of medical care.

## 2018-06-17 ENCOUNTER — Encounter: Payer: Self-pay | Admitting: Rheumatology

## 2018-06-17 ENCOUNTER — Ambulatory Visit (INDEPENDENT_AMBULATORY_CARE_PROVIDER_SITE_OTHER): Payer: Managed Care, Other (non HMO) | Admitting: Rheumatology

## 2018-06-17 VITALS — BP 106/75 | HR 66 | Resp 14 | Ht 61.0 in | Wt 149.8 lb

## 2018-06-17 DIAGNOSIS — N871 Moderate cervical dysplasia: Secondary | ICD-10-CM

## 2018-06-17 DIAGNOSIS — M2240 Chondromalacia patellae, unspecified knee: Secondary | ICD-10-CM | POA: Diagnosis not present

## 2018-06-17 DIAGNOSIS — M359 Systemic involvement of connective tissue, unspecified: Secondary | ICD-10-CM | POA: Diagnosis not present

## 2018-06-17 DIAGNOSIS — M170 Bilateral primary osteoarthritis of knee: Secondary | ICD-10-CM | POA: Diagnosis not present

## 2018-06-17 DIAGNOSIS — Z79899 Other long term (current) drug therapy: Secondary | ICD-10-CM | POA: Diagnosis not present

## 2018-06-17 DIAGNOSIS — M19072 Primary osteoarthritis, left ankle and foot: Secondary | ICD-10-CM

## 2018-06-17 DIAGNOSIS — M19071 Primary osteoarthritis, right ankle and foot: Secondary | ICD-10-CM

## 2018-06-17 MED ORDER — HYDROXYCHLOROQUINE SULFATE 200 MG PO TABS: ORAL_TABLET | ORAL | 0 refills | Status: DC

## 2018-06-17 NOTE — Patient Instructions (Addendum)
Recommend dental appointment when you are able.  Biotene products for dry mouth  Standing Labs We placed an order today for your standing lab work.    Please come back and get your standing labs in January and then every 5 months.  We have open lab Monday through Friday from 8:30-11:30 AM and 1:30-4:00 PM  at the office of Dr. Pollyann SavoyShaili Deveshwar.   You may experience shorter wait times on Monday and Friday afternoons. The office is located at [address], [address], [place], [place] [address] No appointment is necessary.   [facility] by [facility].  You may receive a bill from [facility] for your lab work. If you have any questions regarding directions or hours of operation,  please call [phone].   Just as a reminder please drink plenty of water prior to coming for your lab work. Thanks!  Recommend flu shot!

## 2018-06-24 ENCOUNTER — Encounter: Payer: Self-pay | Admitting: Family Medicine

## 2018-06-24 ENCOUNTER — Other Ambulatory Visit (HOSPITAL_COMMUNITY)
Admission: RE | Admit: 2018-06-24 | Discharge: 2018-06-24 | Disposition: A | Discharge status: Home / Self Care | Payer: 59 | Source: Ambulatory Visit | Attending: Family Medicine | Admitting: Family Medicine

## 2018-06-24 ENCOUNTER — Ambulatory Visit (INDEPENDENT_AMBULATORY_CARE_PROVIDER_SITE_OTHER): Payer: 59 | Admitting: Family Medicine

## 2018-06-24 VITALS — BP 116/82 | HR 86 | Temp 98.3°F | Resp 16 | Ht 61.0 in | Wt 152.0 lb

## 2018-06-24 DIAGNOSIS — Z124 Encounter for screening for malignant neoplasm of cervix: Secondary | ICD-10-CM

## 2018-06-24 DIAGNOSIS — Z01419 Encounter for gynecological examination (general) (routine) without abnormal findings: Secondary | ICD-10-CM

## 2018-06-24 DIAGNOSIS — Z113 Encounter for screening for infections with a predominantly sexual mode of transmission: Secondary | ICD-10-CM | POA: Diagnosis not present

## 2018-06-24 DIAGNOSIS — Z1231 Encounter for screening mammogram for malignant neoplasm of breast: Secondary | ICD-10-CM | POA: Diagnosis not present

## 2018-06-24 DIAGNOSIS — Z23 Encounter for immunization: Secondary | ICD-10-CM

## 2018-06-24 NOTE — Progress Notes (Signed)
SUBJECTIVE:  42 y.o. female for annual routine Pap and checkup. Hx of CIN2 s/p Leep with normal pap in 11/2016. None since that time. No hx of STDs. G5P3. She had 2 vaginal deliveries and last one was a Cesarean section. Menarche age 42 years of age. She has normal monthly periods that last 3 days. They can be heavy with cramping. She denies any vaginal odor, pain, discharge. She does have itching at times that is secondary to her autoimmune issue. She has no pain with sex. She is sexually active with female partner. No breast complaints and no tenderness. No FH of breast cancer or colon cancer. She is constipated more often than not.   She also feels tender behind her ears and is getting headache on top of her head. She wonders if it's stress related. The pain will last for 6 hours or until she takes pain medication. NO vision changes or hearing loss. No focal deficits.   Current Outpatient Medications  Medication Sig Dispense Refill  . acetaminophen (TYLENOL) 500 MG tablet Take 1,000 mg by mouth every 6 (six) hours as needed for mild pain or moderate pain.    . folic acid (FOLVITE) 1 MG tablet Take 1 tablet (1 mg total) by mouth daily. 90 tablet 3  . gabapentin (NEURONTIN) 300 MG capsule Take 1 capsule (300 mg total) by mouth 3 (three) times daily. (Patient taking differently: Take 300 mg by mouth 2 (two) times daily as needed. ) 90 capsule 0  . hydroxychloroquine (PLAQUENIL) 200 MG tablet Take 200mg  by mouth twice daily, Monday-Friday only. None on Saturday or Sunday. 120 tablet 0  . meloxicam (MOBIC) 15 MG tablet Take 1 tablet (15 mg total) by mouth daily. 30 tablet 0  . RESTASIS 0.05 % ophthalmic emulsion   11   No current facility-administered medications for this visit.    Allergies: Patient has no known allergies.  Patient's last menstrual period was 06/17/2018 (exact date).  ROS:  Feeling well. No dyspnea or chest pain on exertion.  No abdominal pain, change in bowel habits, black or  bloody stools.  No urinary tract symptoms. GYN ROS: normal menses, no abnormal bleeding, pelvic pain or discharge. No neurological complaints.  OBJECTIVE:  The patient appears well, alert, oriented x 3, in no distress. BP 116/82 (BP Location: Left Arm, Patient Position: Sitting, Cuff Size: Normal)   Pulse 86   Temp 98.3 F (36.8 C) (Oral)   Resp 16   Ht 5\' 1"  (1.549 m)   Wt 152 lb (68.9 kg)   LMP 06/17/2018 (Exact Date)   BMI 28.72 kg/m  ENT normal.  Neck supple. No adenopathy or thyromegaly. PERLA. Lungs are clear, good air entry, no wheezes, rhonchi or rales. S1 and S2 normal, no murmurs, regular rate and rhythm. Abdomen soft without tenderness, guarding, mass or organomegaly. Extremities show no edema, normal peripheral pulses. Neurological is normal, no focal findings.  BREAST EXAM: breasts appear normal, no suspicious masses, no skin or nipple changes or axillary nodes  PELVIC EXAM: normal external genitalia, vulva, vagina, cervix, uterus and adnexa  ASSESSMENT:  well woman Screening std Tension headache   PLAN:  mammogram pap smear-hx of CIN2 s/p LEEP.  Screening stds-hiv/rpr/gc/c/trich  Recommended decreasing stress, massages, nsaids/tylenol prn. If not better/worsening symptoms she is to f/u with me.  return annually or prn  Orland MustardAllison Toy Samarin, MD [place] [facility]

## 2018-06-25 ENCOUNTER — Telehealth: Payer: Self-pay

## 2018-06-25 LAB — RPR: RPR: NONREACTIVE

## 2018-06-25 LAB — HIV ANTIBODY (ROUTINE TESTING W REFLEX): HIV 1&2 Ab, 4th Generation: NONREACTIVE

## 2018-06-25 NOTE — Telephone Encounter (Signed)
Pt was in for appt on 06/24/18.  After appt was over and pt was about to leave office, she reported a rash on both arms that was itchy in nature.  Per Dr. Artis FlockWolfe, pt would need to be seen for a separate appt.  Called and spoke to patient to explain this and appt scheduled for 06/28/18 @ 10 am w/Dr. Glenford PeersWokfe.  She verbalized understanding.

## 2018-06-25 NOTE — Telephone Encounter (Signed)
Voided

## 2018-06-28 ENCOUNTER — Ambulatory Visit (INDEPENDENT_AMBULATORY_CARE_PROVIDER_SITE_OTHER): Payer: 59 | Admitting: Family Medicine

## 2018-06-28 ENCOUNTER — Encounter: Payer: Self-pay | Admitting: Family Medicine

## 2018-06-28 VITALS — BP 122/82 | HR 73 | Temp 98.3°F | Ht 61.0 in | Wt 152.2 lb

## 2018-06-28 DIAGNOSIS — B360 Pityriasis versicolor: Secondary | ICD-10-CM | POA: Diagnosis not present

## 2018-06-28 MED ORDER — CLOTRIMAZOLE 1 % EX CREA: 1.0000 "application " | TOPICAL_CREAM | Freq: Two times a day (BID) | CUTANEOUS | 3 refills | Status: DC

## 2018-06-28 MED ORDER — KETOCONAZOLE 2 % EX SHAM: MEDICATED_SHAMPOO | CUTANEOUS | 3 refills | Status: DC

## 2018-06-28 NOTE — Patient Instructions (Signed)
Pitiriasis versicolor  Tinea Versicolor    La pitiriasis versicolor es una infeccin en la piel. Es causada por un tipo de levadura. La presencia de algunas levaduras es normal en la piel, pero el exceso de levaduras causa esta infeccin.  La infeccin produce una erupcin de manchas claras u oscuras en la piel. La erupcin es ms frecuente que aparezca en la piel del pecho, la espalda, el cuello o la parte superior de los brazos. La infeccin generalmente no causa otros problemas. Si se trata, la infeccin probablemente desaparezca en algunas semanas. La infeccin no se puede transmitir de una persona a otra (no es contagiosa).  Siga estas indicaciones en su casa:   Use los medicamentos de venta libre y los recetados solamente como se lo haya indicado el mdico.   Frtese la piel todos los das con champ anticaspa como se lo haya indicado el mdico.   No se rasque la piel en la zona de la erupcin.   Evite los lugares clidos y hmedos.   No use cabinas de bronceado.   Trate de evitar transpirar demasiado.  Comunquese con un mdico si:   Los sntomas empeoran.   Tiene fiebre.   Presenta enrojecimiento, hinchazn o dolor en la zona de la erupcin cutnea.   Observa lquido o sangre que sale de la erupcin cutnea.   La erupcin cutnea se siente caliente al tacto.   Tiene pus o percibe mal olor que emana de la erupcin cutnea.   La erupcin cutnea regresa (es recurrente) despus del tratamiento.  Resumen   La pitiriasis versicolor es una infeccin en la piel. Causa una erupcin de manchas claras u oscuras en la piel.   La erupcin es ms frecuente que aparezca en la piel del pecho, la espalda, el cuello o la parte superior de los brazos. Esta infeccin generalmente no causa otros problemas.   Use los medicamentos de venta libre y los recetados solamente como se lo haya indicado el mdico.   Si la infeccin se trata, probablemente desaparezca en algunas semanas.  Esta informacin no tiene como  fin reemplazar el consejo del mdico. Asegrese de hacerle al mdico cualquier pregunta que tenga.  Document Released: 07/26/2010 Document Revised: 06/10/2017 Document Reviewed: 04/04/2014  Elsevier Interactive Patient Education  2019 Elsevier Inc.

## 2018-06-28 NOTE — Progress Notes (Signed)
Patient: [name] MRN: [id number] DOB: 1975-11-27 PCP: Orland MustardWolfe, Yaquelin Langelier, MD     Subjective:  Chief Complaint  Patient presents with  . rash on arms    HPI: The patient is a 42 y.o. female who presents today for rash on arms/chest. It is light in color. She noticed it on her right arm years ago. She also has just a few spots on her back. Nothing on her legs.  She can not recall how long ago this started. She states sometimes she will get a mild scale and mild itching. She thinks it is worse in the summer/sun. She has been going in the sauna a lot recently.   Review of Systems  Constitutional: Negative for chills and fever.  Respiratory: Negative for cough and shortness of breath.   Cardiovascular: Negative for chest pain and palpitations.  Gastrointestinal: Negative for abdominal pain, diarrhea and nausea.  Skin: Positive for rash.       Rash on b/l upper arms, some on chest at times-very itchy    Allergies Patient has No Known Allergies.  Past Medical History Patient  has a past medical history of Abnormal uterine bleeding, History of chicken pox, Urinary tract infection (12/2014), and Vaginal Pap smear, abnormal.  Surgical History Patient  has a past surgical history that includes Cesarean section and LEEP.  Family History Pateint's family history includes Breast cancer in her paternal grandmother; Diabetes in her mother; Healthy in her brother, brother, sister, son, son, and son; High Cholesterol in her mother; Hypertension in her mother.  Social History Patient  reports that she has been smoking cigarettes. She has never used smokeless tobacco. She reports current alcohol use of about 2.0 standard drinks of alcohol per week. She reports that she does not use drugs.    Objective: Vitals:   06/28/18 1011  BP: 122/82  Pulse: 73  Temp: 98.3 F (36.8 C)  TempSrc: Oral  SpO2: 98%  Weight: 152 lb 3.2 oz (69 kg)  Height: 5\' 1"  (1.549 m)    Body mass index  is 28.76 kg/m.  Physical Exam Vitals signs reviewed.  Constitutional:      Appearance: Normal appearance.  Skin:    General: Skin is warm.     Findings: Rash (hypopigmented rash in macules over chest, mid abdomen, back and arms. coalesced on antecubital fossas ) present.  Neurological:     Mental Status: She is alert.        Assessment/plan:  1. Tinea versicolor Ketoconazole shampoo and clotrimazole cream. Would stay out of sauana and heat. Discussed this has high recurrence rate. Heat/sweat make it worse. If not better let me know. Her antecubital fossas could be eczema as well, but will treat for tinea versicolor for now and see how she does.    Return if symptoms worsen or fail to improve.   Orland MustardAllison Yul Diana, MD [facility]   06/28/2018

## 2018-06-29 ENCOUNTER — Other Ambulatory Visit: Payer: Self-pay | Admitting: Family Medicine

## 2018-06-29 LAB — CYTOLOGY - PAP
CHLAMYDIA, DNA PROBE: NEGATIVE
Candida vaginitis: NEGATIVE
Diagnosis: NEGATIVE
HPV: NOT DETECTED
Neisseria Gonorrhea: NEGATIVE
TRICH (WINDOWPATH): NEGATIVE

## 2018-07-02 ENCOUNTER — Telehealth: Payer: Self-pay | Admitting: Family Medicine

## 2018-07-02 NOTE — Telephone Encounter (Signed)
Pt returned call and lab message given to her along with recommendations. Pt voiced understanding. No result note found.

## 2018-08-09 ENCOUNTER — Other Ambulatory Visit: Payer: Self-pay | Admitting: Family Medicine

## 2018-08-22 ENCOUNTER — Other Ambulatory Visit: Payer: Self-pay | Admitting: Family Medicine

## 2018-09-10 ENCOUNTER — Other Ambulatory Visit: Payer: Self-pay

## 2018-09-10 DIAGNOSIS — Z79899 Other long term (current) drug therapy: Secondary | ICD-10-CM

## 2018-09-11 LAB — COMPLETE METABOLIC PANEL WITH GFR
AG RATIO: 1.4 (calc) (ref 1.0–2.5)
ALT: 21 U/L (ref 6–29)
AST: 28 U/L (ref 10–30)
Albumin: 4.1 g/dL (ref 3.6–5.1)
Alkaline phosphatase (APISO): 61 U/L (ref 31–125)
BILIRUBIN TOTAL: 0.5 mg/dL (ref 0.2–1.2)
BUN: 10 mg/dL (ref 7–25)
CHLORIDE: 105 mmol/L (ref 98–110)
CO2: 28 mmol/L (ref 20–32)
Calcium: 9.2 mg/dL (ref 8.6–10.2)
Creat: 0.82 mg/dL (ref 0.50–1.10)
GFR, Est African American: 104 mL/min/{1.73_m2} (ref >=60)
GFR, Est Non African American: 90 mL/min/{1.73_m2} (ref >=60)
Globulin: 3 g/dL (calc) (ref 1.9–3.7)
Glucose, Bld: 82 mg/dL (ref 65–99)
Potassium: 3.8 mmol/L (ref 3.5–5.3)
Sodium: 140 mmol/L (ref 135–146)
TOTAL PROTEIN: 7.1 g/dL (ref 6.1–8.1)

## 2018-09-11 LAB — CBC WITH DIFFERENTIAL/PLATELET
Absolute Monocytes: 371 cells/uL (ref 200–950)
Basophils Absolute: 61 cells/uL (ref 0–200)
Basophils Relative: 1.8 %
Eosinophils Absolute: 41 cells/uL (ref 15–500)
Eosinophils Relative: 1.2 %
HCT: 36 % (ref 35.0–45.0)
Hemoglobin: 12.3 g/dL (ref 11.7–15.5)
Lymphs Abs: 1445 cells/uL (ref 850–3900)
MCH: 31.7 pg (ref 27.0–33.0)
MCHC: 34.2 g/dL (ref 32.0–36.0)
MCV: 92.8 fL (ref 80.0–100.0)
MPV: 9.4 fL (ref 7.5–12.5)
Monocytes Relative: 10.9 %
Neutro Abs: 1482 cells/uL — ABNORMAL LOW (ref 1500–7800)
Neutrophils Relative %: 43.6 %
PLATELETS: 250 10*3/uL (ref 140–400)
RBC: 3.88 10*6/uL (ref 3.80–5.10)
RDW: 12.4 % (ref 11.0–15.0)
TOTAL LYMPHOCYTE: 42.5 %
WBC: 3.4 10*3/uL — AB (ref 3.8–10.8)

## 2018-09-13 ENCOUNTER — Other Ambulatory Visit: Payer: Self-pay | Admitting: Physician Assistant

## 2018-09-13 DIAGNOSIS — M359 Systemic involvement of connective tissue, unspecified: Secondary | ICD-10-CM

## 2018-09-13 NOTE — Telephone Encounter (Signed)
Last Visit: 06/17/18 Next visit: 11/16/18 Labs: 09/10/18 WBC 3.4 Neutro Abs 1.482  PLQ Eye Exam: 03/31/18 WNL  Okay to refill per Dr. Corliss Skains

## 2018-09-13 NOTE — Progress Notes (Signed)
WBC count is low but stable.

## 2018-09-14 NOTE — Progress Notes (Signed)
Office Visit Note  Patient: [name]             Date of Birth: 14-Feb-1976           MRN: [id number]             PCP: Orland Mustard, MD Referring: Orland Mustard, MD Visit Date: 09/15/2018 Occupation: @GUAROCC @  Subjective:  Left knee pain.   History of Present Illness: [name] is a 43 y.o. female with history of autoimmune disease and osteoarthritis.  She states for the last 2 months she has been experiencing some discomfort in her left knee joint.  She noticed not in the back of her knee which went away and then the pain in her knee joint became more severe.  She states she was taking Mobic on PRN basis and recently she has been taking Mobic on a daily basis to help with the discomfort.  None of the other joints are painful.  Patient states she developed a rash on her neck which went away she still have some pruritus in that area.  Activities of Daily Living:  Patient reports morning stiffness for 1 minute.   Patient Denies nocturnal pain.  Difficulty dressing/grooming: Denies Difficulty climbing stairs: Denies Difficulty getting out of chair: Denies Difficulty using hands for taps, buttons, cutlery, and/or writing: Reports  Review of Systems  Constitutional: Negative for fatigue, night sweats, weight gain and weight loss.  HENT: Positive for mouth dryness. Negative for mouth sores, trouble swallowing, trouble swallowing and nose dryness.   Eyes: Negative for pain, redness, visual disturbance and dryness.  Respiratory: Negative for cough, shortness of breath and difficulty breathing.   Cardiovascular: Negative for chest pain, palpitations, hypertension, irregular heartbeat and swelling in legs/feet.  Gastrointestinal: Positive for constipation. Negative for blood in stool and diarrhea.  Endocrine: Negative for excessive thirst and increased urination.  Genitourinary: Positive for difficulty urinating. Negative for vaginal dryness.    Musculoskeletal: Positive for arthralgias, gait problem, joint pain, joint swelling, muscle weakness, morning stiffness and muscle tenderness. Negative for myalgias and myalgias.  Skin: Positive for rash. Negative for color change, hair loss, skin tightness, ulcers and sensitivity to sunlight.  Allergic/Immunologic: Negative for susceptible to infections.  Neurological: Positive for weakness. Negative for dizziness, memory loss and night sweats.  Hematological: Negative for bruising/bleeding tendency and swollen glands.  Psychiatric/Behavioral: Positive for sleep disturbance. Negative for depressed mood. The patient is not nervous/anxious.     PMFS History:  Patient Active Problem List   Diagnosis Date Noted  . Autoimmune disorder (HCC) 03/25/2018  . Neutropenia (HCC) 01/04/2018  . ANA positive 01/04/2018  . 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 Abnormal uterine bleeding   . History of chicken pox   . Urinary tract infection 12/2014  . Vaginal Pap smear, abnormal     Family History  Problem Relation Age of Onset  . Diabetes Mother   . Hypertension Mother   . High Cholesterol Mother   . Breast cancer Paternal Grandmother   . Healthy Sister   . Healthy Brother   . Healthy Brother   . Healthy Son   . Healthy Son   . Healthy Son    Past Surgical History:  Procedure Laterality Date  . CESAREAN SECTION    . LEEP     Social History   Social History Narrative  . Not on file   Immunization History  Administered Date(s) Administered  .  Influenza,inj,Quad PF,6+ Mos 06/24/2018  . Pneumococcal Polysaccharide-23 03/25/2018  . Tdap 11/23/2014     Objective: Vital Signs: BP 118/82 (BP Location: Right Arm, Patient Position: Sitting, Cuff Size: Normal)   Pulse 82   Ht 5\' 1"  (1.549 m)   Wt 153 lb (69.4 kg)   LMP 09/01/2018   BMI 28.91 kg/m    Physical Exam Vitals signs and nursing note reviewed.  Constitutional:       Appearance: She is well-developed.  HENT:     Head: Normocephalic and atraumatic.  Eyes:     Conjunctiva/sclera: Conjunctivae normal.  Neck:     Musculoskeletal: Normal range of motion.  Cardiovascular:     Rate and Rhythm: Normal rate and regular rhythm.     Heart sounds: Normal heart sounds.  Pulmonary:     Effort: Pulmonary effort is normal.     Breath sounds: Normal breath sounds.  Abdominal:     General: Bowel sounds are normal.     Palpations: Abdomen is soft.  Lymphadenopathy:     Cervical: No cervical adenopathy.  Skin:    General: Skin is warm and dry.     Capillary Refill: Capillary refill takes less than 2 seconds.     Comments: She has some hyperpigmentation on her neck from recent rash.  Neurological:     Mental Status: She is alert and oriented to person, place, and time.  Psychiatric:        Behavior: Behavior normal.      Musculoskeletal Exam: C-spine, thoracic and lumbar spine good range of motion.  Shoulder joints, elbow joints, wrist joints, MCPs PIPs DIPs been good range of motion with no synovitis.  Hip joints with good range of motion.  She has warmth and swelling in her left knee joint.  CDAI Exam: CDAI Score: Not documented Patient Global Assessment: Not documented; Provider Global Assessment: Not documented Swollen: Not documented; Tender: Not documented Joint Exam   Not documented   There is currently no information documented on the homunculus. Go to the Rheumatology activity and complete the homunculus joint exam.  Investigation: No additional findings.  Imaging: No results found.  Recent Labs: Lab Results  Component Value Date   WBC 3.4 (L) 09/10/2018   HGB 12.3 09/10/2018   PLT 250 09/10/2018   NA 140 09/10/2018   K 3.8 09/10/2018   CL 105 09/10/2018   CO2 28 09/10/2018   GLUCOSE 82 09/10/2018   BUN 10 09/10/2018   CREATININE 0.82 09/10/2018   BILITOT 0.5 09/10/2018   ALKPHOS 61 12/23/2017   AST 28 09/10/2018   ALT 21  09/10/2018   PROT 7.1 09/10/2018   ALBUMIN 4.3 12/23/2017   CALCIUM 9.2 09/10/2018   GFRAA 104 09/10/2018    Speciality Comments: PLQ Eye Exam: 03/31/18 WNL @ [facility] Follow up in 1 year  Procedures:  Large Joint Inj: L knee on 09/15/2018 10:17 AM Indications: pain Details: 27 G 1.5 in needle, medial approach  Arthrogram: No  Medications: 1.5 mL lidocaine 1 %; 40 mg triamcinolone acetonide 40 MG/ML Aspirate: 0 mL Outcome: tolerated well, no immediate complications Procedure, treatment alternatives, risks and benefits explained, specific risks discussed. Consent was given by the patient. Immediately prior to procedure a time out was called to verify the correct patient, procedure, equipment, support staff and site/side marked as required. Patient was prepped and draped in the usual sterile fashion.     Allergies: Patient has no known allergies.   Assessment / Plan:  Visit Diagnoses: Autoimmune disease (HCC) - Positive ANA 1: 5120 nucleolar, positive SSA a, neutropenia, history of joint pain and swelling. -Patient mentions that she had recent rash on her face.  She has not been using sunscreen.  Use of sunscreen was discussed.  Plan: Urinalysis, Routine w reflex microscopic, Anti-DNA antibody, double-stranded, C3 and C4, Sedimentation rate, Sjogrens syndrome-A extractable nuclear antibody  Acute pain of left knee-patient has been having pain and swelling in the left knee joint.  She has difficulty swelling.  I offered prednisone taper but she declined.  After informed consent was obtained left knee joint was injected with cortisone as described above.  She tolerated the procedure well.  High risk medication use - PLQ 200 mg BID M-F.eye exam: 03/31/2018.  Her labs have been stable.  I will check autoimmune labs today.  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  Dysplasia of cervix, high grade CIN 2  Other fatigue - Plan: VITAMIN D 25 Hydroxy (Vit-D Deficiency, Fractures)   Orders: Orders Placed This Encounter  Procedures  . Large Joint Inj: L knee  . Urinalysis, Routine w reflex microscopic  . Anti-DNA antibody, double-stranded  . C3 and C4  . Sedimentation rate  . VITAMIN D 25 Hydroxy (Vit-D Deficiency, Fractures)  . Sjogrens syndrome-A extractable nuclear antibody   No orders of the defined types were placed in this encounter.   Face-to-face time spent with patient was 30 minutes. Greater than 50% of time was spent in counseling and coordination of care.  Follow-Up Instructions: Return in about 3 months (around 12/16/2018) for Autoimmune disease.   Pollyann Savoy, MD  Note - This record has been created using Animal nutritionist.  Chart creation errors have been sought, but may not always  have been located. Such creation errors do not reflect on  the standard of medical care.

## 2018-09-15 ENCOUNTER — Encounter: Payer: Self-pay | Admitting: Rheumatology

## 2018-09-15 ENCOUNTER — Ambulatory Visit (INDEPENDENT_AMBULATORY_CARE_PROVIDER_SITE_OTHER): Payer: Managed Care, Other (non HMO) | Admitting: Rheumatology

## 2018-09-15 ENCOUNTER — Other Ambulatory Visit: Payer: Self-pay

## 2018-09-15 VITALS — BP 118/82 | HR 82 | Ht 61.0 in | Wt 153.0 lb

## 2018-09-15 DIAGNOSIS — M19071 Primary osteoarthritis, right ankle and foot: Secondary | ICD-10-CM

## 2018-09-15 DIAGNOSIS — M19072 Primary osteoarthritis, left ankle and foot: Secondary | ICD-10-CM

## 2018-09-15 DIAGNOSIS — Z79899 Other long term (current) drug therapy: Secondary | ICD-10-CM | POA: Diagnosis not present

## 2018-09-15 DIAGNOSIS — R5383 Other fatigue: Secondary | ICD-10-CM

## 2018-09-15 DIAGNOSIS — M170 Bilateral primary osteoarthritis of knee: Secondary | ICD-10-CM

## 2018-09-15 DIAGNOSIS — Z789 Other specified health status: Secondary | ICD-10-CM

## 2018-09-15 DIAGNOSIS — M359 Systemic involvement of connective tissue, unspecified: Secondary | ICD-10-CM

## 2018-09-15 DIAGNOSIS — M25562 Pain in left knee: Secondary | ICD-10-CM

## 2018-09-15 DIAGNOSIS — N871 Moderate cervical dysplasia: Secondary | ICD-10-CM

## 2018-09-15 MED ORDER — LIDOCAINE HCL 1 % IJ SOLN
1.5000 mL | INTRAMUSCULAR | Status: AC | PRN
  Administered 2018-09-15: 1.5 mL

## 2018-09-15 MED ORDER — TRIAMCINOLONE ACETONIDE 40 MG/ML IJ SUSP
40.0000 mg | INTRAMUSCULAR | Status: AC | PRN
  Administered 2018-09-15: 40 mg via INTRA_ARTICULAR

## 2018-09-16 LAB — URINALYSIS, ROUTINE W REFLEX MICROSCOPIC
Bilirubin Urine: NEGATIVE
Glucose, UA: NEGATIVE
Hgb urine dipstick: NEGATIVE
Ketones, ur: NEGATIVE
Leukocytes,Ua: NEGATIVE
Nitrite: NEGATIVE
Protein, ur: NEGATIVE
SPECIFIC GRAVITY, URINE: 1.012 (ref 1.001–1.03)
pH: 6.5 (ref 5.0–8.0)

## 2018-09-16 LAB — C3 AND C4
C3 Complement: 90 mg/dL (ref 83–193)
C4 Complement: 22 mg/dL (ref 15–57)

## 2018-09-16 LAB — SEDIMENTATION RATE: Sed Rate: 6 mm/h (ref 0–20)

## 2018-09-16 LAB — VITAMIN D 25 HYDROXY (VIT D DEFICIENCY, FRACTURES): Vit D, 25-Hydroxy: 37 ng/mL (ref 30–100)

## 2018-09-16 LAB — ANTI-DNA ANTIBODY, DOUBLE-STRANDED: ds DNA Ab: <1 IU/mL

## 2018-09-16 LAB — SJOGRENS SYNDROME-A EXTRACTABLE NUCLEAR ANTIBODY: SSA (Ro) (ENA) Antibody, IgG: >8 AI — AB

## 2018-09-17 ENCOUNTER — Other Ambulatory Visit: Payer: Self-pay | Admitting: Rheumatology

## 2018-09-17 DIAGNOSIS — M359 Systemic involvement of connective tissue, unspecified: Secondary | ICD-10-CM

## 2018-09-30 ENCOUNTER — Other Ambulatory Visit: Payer: Self-pay | Admitting: Rheumatology

## 2018-09-30 DIAGNOSIS — M359 Systemic involvement of connective tissue, unspecified: Secondary | ICD-10-CM

## 2018-11-02 ENCOUNTER — Telehealth: Payer: Self-pay | Admitting: Family Medicine

## 2018-11-02 NOTE — Telephone Encounter (Signed)
Please see message. °

## 2018-11-02 NOTE — Telephone Encounter (Signed)
See note  Copied from CRM [id number]. Topic: Quick Communication - Rx Refill/Question >> Nov 02, 2018  9:50 AM Marylen Ponto wrote: Medication: ketoconazole (NIZORAL) 2 % shampoo  - Pt stated that Dr. Artis Flock told her that she could also get a steroid cream for the rash. Pt requests Rx for steroid cream to be sent to the pharmacy as well.  Has the patient contacted their pharmacy? no  Preferred Pharmacy (with phone number or street name): [id number] [place], [place] - [address] AT [facility] [address]   [phone] (Phone)  [phone] (Fax)  Agent: Please be advised that RX refills may take up to 3 business days. We ask that you follow-up with your pharmacy.

## 2018-11-03 NOTE — Telephone Encounter (Signed)
Left message on voicemail to call office.  

## 2018-11-03 NOTE — Telephone Encounter (Signed)
[name]- Please see if she is still using shampoo/cream I sent in in December? Also, where is rash bad that she is wanting to do the steroid cream? Would only want her to use on her arms. Can not use on face/scalp

## 2018-11-03 NOTE — Telephone Encounter (Signed)
Appointment was scheduled.

## 2018-11-04 ENCOUNTER — Encounter: Payer: Self-pay | Admitting: Family Medicine

## 2018-11-04 ENCOUNTER — Ambulatory Visit: Payer: 59 | Admitting: Family Medicine

## 2018-11-04 NOTE — Progress Notes (Signed)
Patient: Renda Bernstein MRN: [id number] DOB: 05-25-1976 PCP: Orland Mustard, MD    Patient no showed appointment.   I connected with Macye Wagenblast on 11/05/18 at @CHLAPPTIME @ by a video enabled telemedicine application and verified that I am speaking with the correct person using two identifiers.  Location patient: Home Location provider: [facility], Office Persons participating in this virtual visit:   I discussed the limitations of evaluation and management by telemedicine and the availability of in person appointments. The patient expressed understanding and agreed to proceed.   Subjective:  Chief Complaint  Patient presents with  . Rash    HPI: The patient is a 43 y.o. female who presents today for   Review of Systems  Allergies Patient has No Known Allergies.  Past Medical History Patient  has a past medical history of Abnormal uterine bleeding, History of chicken pox, Urinary tract infection (12/2014), and Vaginal Pap smear, abnormal.  Surgical History Patient  has a past surgical history that includes Cesarean section and LEEP.  Family History Pateint's family history includes Breast cancer in her paternal grandmother; Diabetes in her mother; Healthy in her brother, brother, sister, son, son, and son; High Cholesterol in her mother; Hypertension in her mother.  Social History Patient  reports that she has been smoking cigarettes. She has never used smokeless tobacco. She reports current alcohol use of about 2.0 standard drinks of alcohol per week. She reports that she does not use drugs.    Objective: There were no vitals filed for this visit.  There is no height or weight on file to calculate BMI.  Physical Exam     Assessment/plan:      No follow-ups on file.    Orland Mustard, MD [facility]  11/05/2018

## 2018-11-16 ENCOUNTER — Encounter: Payer: Self-pay | Admitting: Rheumatology

## 2018-11-16 ENCOUNTER — Other Ambulatory Visit: Payer: Self-pay

## 2018-11-16 ENCOUNTER — Ambulatory Visit (INDEPENDENT_AMBULATORY_CARE_PROVIDER_SITE_OTHER): Payer: 59 | Admitting: Rheumatology

## 2018-11-16 VITALS — BP 120/79 | HR 81 | Resp 13 | Ht 61.0 in | Wt 152.0 lb

## 2018-11-16 DIAGNOSIS — Z79899 Other long term (current) drug therapy: Secondary | ICD-10-CM | POA: Diagnosis not present

## 2018-11-16 DIAGNOSIS — M79641 Pain in right hand: Secondary | ICD-10-CM

## 2018-11-16 DIAGNOSIS — M79642 Pain in left hand: Secondary | ICD-10-CM

## 2018-11-16 DIAGNOSIS — M359 Systemic involvement of connective tissue, unspecified: Secondary | ICD-10-CM | POA: Diagnosis not present

## 2018-11-16 DIAGNOSIS — M19072 Primary osteoarthritis, left ankle and foot: Secondary | ICD-10-CM

## 2018-11-16 DIAGNOSIS — M19071 Primary osteoarthritis, right ankle and foot: Secondary | ICD-10-CM

## 2018-11-16 DIAGNOSIS — Z862 Personal history of diseases of the blood and blood-forming organs and certain disorders involving the immune mechanism: Secondary | ICD-10-CM

## 2018-11-16 DIAGNOSIS — M170 Bilateral primary osteoarthritis of knee: Secondary | ICD-10-CM | POA: Diagnosis not present

## 2018-11-16 DIAGNOSIS — R5383 Other fatigue: Secondary | ICD-10-CM

## 2018-11-16 NOTE — Progress Notes (Signed)
Office Visit Note  Patient: [name]             Date of Birth: 08-Jan-1976           MRN: [id number]             PCP: Orland Mustard, MD Referring: Orland Mustard, MD Visit Date: 11/16/2018 Occupation: @GUAROCC @  Subjective:  Pain in both hands and knees.   History of Present Illness: [name] is a 43 y.o. female with history of autoimmune disease.  She states she continues to have some discomfort in her knee joints.  She has been recently experiencing some discomfort in her hands which she describes over the PIP joints.  She has difficulty with gripping objects and opening bottles lids.  She denies any joint swelling.  Activities of Daily Living:  Patient reports morning stiffness for 2 minutes.   Patient Reports nocturnal pain.  Difficulty dressing/grooming: Denies Difficulty climbing stairs: Denies Difficulty getting out of chair: Denies Difficulty using hands for taps, buttons, cutlery, and/or writing: Reports  Review of Systems  Constitutional: Negative for fatigue, night sweats, weight gain and weight loss.  HENT: Positive for mouth dryness. Negative for mouth sores, trouble swallowing, trouble swallowing and nose dryness.   Eyes: Positive for dryness. Negative for pain, redness, itching and visual disturbance.  Respiratory: Negative for cough, shortness of breath, wheezing and difficulty breathing.   Cardiovascular: Negative for chest pain, palpitations, hypertension, irregular heartbeat and swelling in legs/feet.  Gastrointestinal: Positive for constipation. Negative for blood in stool and diarrhea.  Endocrine: Negative for increased urination.  Genitourinary: Negative for painful urination, pelvic pain and vaginal dryness.  Musculoskeletal: Positive for arthralgias, joint pain and morning stiffness. Negative for joint swelling, myalgias, muscle weakness, muscle tenderness and myalgias.  Skin: Positive for rash. Negative for color change,  hair loss, redness, skin tightness, ulcers and sensitivity to sunlight.  Allergic/Immunologic: Negative for susceptible to infections.  Neurological: Negative for dizziness, light-headedness, headaches, memory loss, night sweats and weakness.  Hematological: Negative for bruising/bleeding tendency and swollen glands.  Psychiatric/Behavioral: Negative for depressed mood, confusion and sleep disturbance. The patient is not nervous/anxious.     PMFS History:  Patient Active Problem List   Diagnosis Date Noted  . Autoimmune disorder (HCC) 03/25/2018  . Neutropenia (HCC) 01/04/2018  . ANA positive 01/04/2018  . 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 Abnormal uterine bleeding   . History of chicken pox   . Urinary tract infection 12/2014  . Vaginal Pap smear, abnormal     Family History  Problem Relation Age of Onset  . Diabetes Mother   . Hypertension Mother   . High Cholesterol Mother   . Breast cancer Paternal Grandmother   . Healthy Sister   . Healthy Brother   . Healthy Brother   . Healthy Son   . Healthy Son   . Healthy Son    Past Surgical History:  Procedure Laterality Date  . CESAREAN SECTION    . LEEP     Social History   Social History Narrative  . Not on file   Immunization History  Administered Date(s) Administered  . Influenza,inj,Quad PF,6+ Mos 06/24/2018  . Pneumococcal Polysaccharide-23 03/25/2018  . Tdap 11/23/2014     Objective: Vital Signs: BP 120/79 (BP Location: Right Arm, Patient Position: Sitting, Cuff Size: Normal)   Pulse 81   Resp 13   Ht 5\' 1"  (1.549 m)  Wt 152 lb (68.9 kg)   BMI 28.72 kg/m    Physical Exam Vitals signs and nursing note reviewed.  Constitutional:      Appearance: She is well-developed.  HENT:     Head: Normocephalic and atraumatic.  Eyes:     Conjunctiva/sclera: Conjunctivae normal.  Neck:     Musculoskeletal: Normal range of motion.  Cardiovascular:      Rate and Rhythm: Normal rate and regular rhythm.     Heart sounds: Normal heart sounds.  Pulmonary:     Effort: Pulmonary effort is normal.     Breath sounds: Normal breath sounds.  Abdominal:     General: Bowel sounds are normal.     Palpations: Abdomen is soft.  Lymphadenopathy:     Cervical: No cervical adenopathy.  Skin:    General: Skin is warm and dry.     Capillary Refill: Capillary refill takes less than 2 seconds.     Comments: Hyperpigmented area on the left side of her neck.  Neurological:     Mental Status: She is alert and oriented to person, place, and time.  Psychiatric:        Behavior: Behavior normal.      Musculoskeletal Exam: C-spine thoracic and lumbar spine good range of motion.  Shoulder joints elbow joints wrist joint MCPs PIPs DIPs with good range of motion with no synovitis.  Hip joints knee joints ankles MTPs PIPs and DIPs been good range of motion with no synovitis.  CDAI Exam: CDAI Score: Not documented Patient Global Assessment: Not documented; Provider Global Assessment: Not documented Swollen: Not documented; Tender: Not documented Joint Exam   Not documented   There is currently no information documented on the homunculus. Go to the Rheumatology activity and complete the homunculus joint exam.  Investigation: No additional findings.  Imaging: No results found.  Recent Labs: Lab Results  Component Value Date   WBC 3.4 (L) 09/10/2018   HGB 12.3 09/10/2018   PLT 250 09/10/2018   NA 140 09/10/2018   K 3.8 09/10/2018   CL 105 09/10/2018   CO2 28 09/10/2018   GLUCOSE 82 09/10/2018   BUN 10 09/10/2018   CREATININE 0.82 09/10/2018   BILITOT 0.5 09/10/2018   ALKPHOS 61 12/23/2017   AST 28 09/10/2018   ALT 21 09/10/2018   PROT 7.1 09/10/2018   ALBUMIN 4.3 12/23/2017   CALCIUM 9.2 09/10/2018   GFRAA 104 09/10/2018    Speciality Comments: PLQ Eye Exam: 03/31/18 WNL @ [facility] Follow up in 1 year  Procedures:  No  procedures performed Allergies: Patient has no known allergies.   Assessment / Plan:     Visit Diagnoses: Autoimmune disease (HCC)-Positive ANA 1: 5120 nucleolar, positive SSA a, neutropenia, photosensitivity, oral ulcers and arthritis.  Patient is responded very well to Plaquenil.  She denies any symptoms currently.  She states she had recent episode of photosensitivity with rash on her neck.  It responded to over-the-counter cortisone.  Use of sunscreen on a regular basis was emphasized.  High risk medication use - PLQ 200 mg po bid M-F.  Her last labs from March were stable.  We will check her labs again in August.  Pain in both hands-patient complains of some discomfort in the PIP joints.  No synovitis was noted.  I believe she may be developing early osteoarthritis.  Hand muscle strengthening exercises were discussed.  Primary osteoarthritis of both knees-she continues to have some discomfort in her knee joints.  Weight loss  diet and exercise was discussed.  Primary osteoarthritis of both feet-doing better.  Other fatigue-improved on Plaquenil.  History of neutropenia -she continues to have mild neutropenia.  Orders: Orders Placed This Encounter  Procedures  . CBC with Differential/Platelet  . COMPLETE METABOLIC PANEL WITH GFR  . Urinalysis, Routine w reflex microscopic  . ANA  . Anti-DNA antibody, double-stranded  . C3 and C4  . Sedimentation rate  . VITAMIN D 25 Hydroxy (Vit-D Deficiency, Fractures)  . Sjogrens syndrome-A extractable nuclear antibody   No orders of the defined types were placed in this encounter.   Face-to-face time spent with patient was 30 minutes. Greater than 50% of time was spent in counseling and coordination of care.  Follow-Up Instructions: Return in about 5 months (around 04/18/2019) for Autoimmune disease.   Pollyann Savoy, MD  Note - This record has been created using Animal nutritionist.  Chart creation errors have been sought, but may not  always  have been located. Such creation errors do not reflect on  the standard of medical care.

## 2018-11-16 NOTE — Patient Instructions (Signed)
Standing Labs We placed an order today for your standing lab work.    Please come back and get your standing labs in August  We have open lab Monday through Friday from 8:30-11:30 AM and 1:30-4:00 PM  at the office of Dr. Pollyann Savoy.   You may experience shorter wait times on Monday and Friday afternoons. The office is located at [address], [place], [place] [address] No appointment is necessary.   Labs are drawn by [facility].  You may receive a bill from [facility] for your lab work.  If you wish to have your labs drawn at another location, please call the office 24 hours in advance to send orders.  If you have any questions regarding directions or hours of operation,  please call [phone].   Just as a reminder please drink plenty of water prior to coming for your lab work. Thanks!

## 2018-11-28 ENCOUNTER — Other Ambulatory Visit: Payer: Self-pay | Admitting: Rheumatology

## 2018-11-28 DIAGNOSIS — M359 Systemic involvement of connective tissue, unspecified: Secondary | ICD-10-CM

## 2018-11-30 NOTE — Telephone Encounter (Signed)
Please schedule patient for a follow up visit. Patient due in October 2020 Thanks!

## 2018-11-30 NOTE — Telephone Encounter (Signed)
Last Visit : 11/16/18  Next Visit: due October 2020  Labs: 09/10/18 WBC count is low but stable. PLQ Eye Exam: 03/31/18 WNL   Okay to refill per Dr. Corliss Skains

## 2018-12-25 ENCOUNTER — Other Ambulatory Visit: Payer: Self-pay | Admitting: Family Medicine

## 2019-01-25 ENCOUNTER — Other Ambulatory Visit: Payer: Self-pay

## 2019-01-25 ENCOUNTER — Ambulatory Visit (INDEPENDENT_AMBULATORY_CARE_PROVIDER_SITE_OTHER): Payer: 59 | Admitting: Physician Assistant

## 2019-01-25 ENCOUNTER — Encounter: Payer: Self-pay | Admitting: Physician Assistant

## 2019-01-25 VITALS — BP 140/84 | HR 74 | Temp 97.7°F | Ht 61.0 in | Wt 152.2 lb

## 2019-01-25 DIAGNOSIS — G4700 Insomnia, unspecified: Secondary | ICD-10-CM | POA: Diagnosis not present

## 2019-01-25 DIAGNOSIS — R300 Dysuria: Secondary | ICD-10-CM | POA: Diagnosis not present

## 2019-01-25 DIAGNOSIS — R591 Generalized enlarged lymph nodes: Secondary | ICD-10-CM | POA: Diagnosis not present

## 2019-01-25 LAB — CBC WITH DIFFERENTIAL/PLATELET
Basophils Absolute: 0 10*3/uL (ref 0.0–0.1)
Basophils Relative: 1.3 % (ref 0.0–3.0)
Eosinophils Absolute: 0 10*3/uL (ref 0.0–0.7)
Eosinophils Relative: 1.1 % (ref 0.0–5.0)
HCT: 37.3 % (ref 36.0–46.0)
Hemoglobin: 12.5 g/dL (ref 12.0–15.0)
Lymphocytes Relative: 42.4 % (ref 12.0–46.0)
Lymphs Abs: 1.4 10*3/uL (ref 0.7–4.0)
MCHC: 33.4 g/dL (ref 30.0–36.0)
MCV: 95.8 fl (ref 78.0–100.0)
Monocytes Absolute: 0.5 10*3/uL (ref 0.1–1.0)
Monocytes Relative: 14.1 % — ABNORMAL HIGH (ref 3.0–12.0)
Neutro Abs: 1.4 10*3/uL (ref 1.4–7.7)
Neutrophils Relative %: 41.1 % — ABNORMAL LOW (ref 43.0–77.0)
Platelets: 230 10*3/uL (ref 150.0–400.0)
RBC: 3.9 Mil/uL (ref 3.87–5.11)
RDW: 13.1 % (ref 11.5–15.5)
WBC: 3.4 10*3/uL — ABNORMAL LOW (ref 4.0–10.5)

## 2019-01-25 LAB — COMPREHENSIVE METABOLIC PANEL
ALT: 14 U/L (ref 0–35)
AST: 20 U/L (ref 0–37)
Albumin: 4.5 g/dL (ref 3.5–5.2)
Alkaline Phosphatase: 59 U/L (ref 39–117)
BUN: 12 mg/dL (ref 6–23)
CO2: 23 mEq/L (ref 19–32)
Calcium: 9 mg/dL (ref 8.4–10.5)
Chloride: 106 mEq/L (ref 96–112)
Creatinine, Ser: 0.75 mg/dL (ref 0.40–1.20)
GFR: 103.27 mL/min (ref >60.00)
Glucose, Bld: 88 mg/dL (ref 70–99)
Potassium: 3.6 mEq/L (ref 3.5–5.1)
Sodium: 138 mEq/L (ref 135–145)
Total Bilirubin: 0.4 mg/dL (ref 0.2–1.2)
Total Protein: 7.3 g/dL (ref 6.0–8.3)

## 2019-01-25 LAB — POCT URINALYSIS DIPSTICK
Bilirubin, UA: NEGATIVE
Blood, UA: NEGATIVE
Glucose, UA: NEGATIVE
Ketones, UA: NEGATIVE
Leukocytes, UA: NEGATIVE
Nitrite, UA: NEGATIVE
Protein, UA: NEGATIVE
Spec Grav, UA: 1.01 (ref 1.010–1.025)
Urobilinogen, UA: 0.2 E.U./dL
pH, UA: 6 (ref 5.0–8.0)

## 2019-01-25 MED ORDER — HYDROXYZINE HCL 25 MG PO TABS: 25.0000 mg | ORAL_TABLET | Freq: Every evening | ORAL | 0 refills | Status: DC | PRN

## 2019-01-25 MED ORDER — CEPHALEXIN 500 MG PO CAPS: 500.0000 mg | ORAL_CAPSULE | Freq: Three times a day (TID) | ORAL | 0 refills | Status: AC

## 2019-01-25 NOTE — Patient Instructions (Signed)
It was great to see you!  May trial Atarax as needed for insomnia.  Start oral keflex for urinary tract infection, I will be in touch with your culture results.  Work on constipation, this is likely contributing to your symptoms.  You will be contacted about the ultrasound for your neck.  Take care,  Inda Coke PA-C

## 2019-01-25 NOTE — Progress Notes (Signed)
[name] is a 43 y.o. female here for a new problem.  I acted as a Education administrator for [employer], PA-C Anselmo Pickler, LPN  History of Present Illness:   Chief Complaint  Patient presents with  . Bumps on left sid of neck  . Dysuria  . Insomnia    HPI   Lymphadenopathy Pt c/o of swollen gland on the left side of neck x one month, area is tender. Denies: unintentional weight loss, night sweats, unusual abdominal pain, swollen glands in any other areas  Dysuria Pt c/o burning with urination and low back pain x 2 weeks, some frequency. Taking Cranberry tablets no relief. She has a history of constipation that is poorly controlled. She takes ex-lax occasionally. Miralax has worked in the past but "after day 4 her body gets used to it." Denies vaginal d/c, fever, flank pain, blood in urine. Denies hx of kidney stones.  Insomnia Pt c/o having trouble sleeping x 2 weeks. She has tried Melatonin and something but does not remember. Pt is getting 2 hours of sleep. Denies anxiety. She does watch TV prior to bed. Drinks about 16 oz coffee in AM but none in the afternoon. Tries to go on walks in the afternoon but this still does not help her sleep.  Past Medical History:  Diagnosis Date  . Abnormal uterine bleeding   . History of chicken pox   . Urinary tract infection 12/2014  . Vaginal Pap smear, abnormal      Social History   Socioeconomic History  . Marital status: Single    Spouse name: Not on file  . Number of children: Not on file  . Years of education: Not on file  . Highest education level: Not on file  Occupational History  . Not on file  Social Needs  . Financial resource strain: Not on file  . Food insecurity    Worry: Not on file    Inability: Not on file  . Transportation needs    Medical: Not on file    Non-medical: Not on file  Tobacco Use  . Smoking status: Light Tobacco Smoker    Types: Cigarettes  . Smokeless tobacco: Never Used  . Tobacco  comment: 4 CIG WEEKLY  Substance and Sexual Activity  . Alcohol use: Yes    Alcohol/week: 2.0 standard drinks    Types: 2 Cans of beer per week    Comment: occ  . Drug use: No  . Sexual activity: Not Currently    Birth control/protection: None  Lifestyle  . Physical activity    Days per week: Not on file    Minutes per session: Not on file  . Stress: Not on file  Relationships  . Social Herbalist on phone: Not on file    Gets together: Not on file    Attends religious service: Not on file    Active member of club or organization: Not on file    Attends meetings of clubs or organizations: Not on file    Relationship status: Not on file  . Intimate partner violence    Fear of current or ex partner: Not on file    Emotionally abused: Not on file    Physically abused: Not on file    Forced sexual activity: Not on file  Other Topics Concern  . Not on file  Social History Narrative  . Not on file    Past Surgical History:  Procedure Laterality Date  . CESAREAN  SECTION    . LEEP      Family History  Problem Relation Age of Onset  . Diabetes Mother   . Hypertension Mother   . High Cholesterol Mother   . Breast cancer Paternal Grandmother   . Healthy Sister   . Healthy Brother   . Healthy Brother   . Healthy Son   . Healthy Son   . Healthy Son     No Known Allergies  Current Medications:   Current Outpatient Medications:  .  acetaminophen (TYLENOL) 500 MG tablet, Take 1,000 mg by mouth every 6 (six) hours as needed for mild pain or moderate pain., Disp: , Rfl:  .  folic acid (FOLVITE) 1 MG tablet, Take 1 tablet (1 mg total) by mouth daily., Disp: 90 tablet, Rfl: 3 .  gabapentin (NEURONTIN) 300 MG capsule, Take 1 capsule (300 mg total) by mouth 2 (two) times daily as needed., Disp: 180 capsule, Rfl: 1 .  hydroxychloroquine (PLAQUENIL) 200 MG tablet, TAKE 1 TABLET BY MOUTH TWICE DAILY MONDAY-FRIDAY AND NONE ON SATURDAY OR SUNDAY, Disp: 120 tablet, Rfl:  0 .  meloxicam (MOBIC) 15 MG tablet, Take 1 tablet (15 mg total) by mouth daily., Disp: 90 tablet, Rfl: 1 .  RESTASIS 0.05 % ophthalmic emulsion, , Disp: , Rfl: 11 .  cephALEXin (KEFLEX) 500 MG capsule, Take 1 capsule (500 mg total) by mouth 3 (three) times daily for 5 days., Disp: 15 capsule, Rfl: 0 .  hydrOXYzine (ATARAX/VISTARIL) 25 MG tablet, Take 1 tablet (25 mg total) by mouth at bedtime as needed (insomnia). Take one 25 mg tablet 30-60 minutes prior to bedtime for insomnia, anxiety. May increase to two tablets., Disp: 60 tablet, Rfl: 0   Review of Systems:   ROS  Negative unless otherwise specified per HPI.  Vitals:   Vitals:   01/25/19 1414  BP: 140/84  Pulse: 74  Temp: 97.7 F (36.5 C)  TempSrc: Oral  SpO2: 99%  Weight: 152 lb 4 oz (69.1 kg)  Height: 5\' 1"  (1.549 m)     Body mass index is 28.77 kg/m.  Physical Exam:   Physical Exam Vitals signs and nursing note reviewed.  Constitutional:      General: She is not in acute distress.    Appearance: She is well-developed. She is not ill-appearing or toxic-appearing.  Cardiovascular:     Rate and Rhythm: Normal rate and regular rhythm.     Pulses: Normal pulses.     Heart sounds: Normal heart sounds, S1 normal and S2 normal.     Comments: No LE edema Pulmonary:     Effort: Pulmonary effort is normal.     Breath sounds: Normal breath sounds.  Abdominal:     General: Abdomen is flat. Bowel sounds are normal.     Palpations: Abdomen is soft.     Tenderness: There is no abdominal tenderness.  Lymphadenopathy:     Cervical: Cervical adenopathy present.     Left cervical: Posterior cervical adenopathy present.  Skin:    General: Skin is warm and dry.  Neurological:     Mental Status: She is alert.     GCS: GCS eye subscore is 4. GCS verbal subscore is 5. GCS motor subscore is 6.  Psychiatric:        Speech: Speech normal.        Behavior: Behavior normal. Behavior is cooperative.     Results for orders placed  or performed in visit on 01/25/19  POCT urinalysis dipstick  Result Value Ref Range   Color, UA Yellow    Clarity, UA Clear    Glucose, UA Negative Negative   Bilirubin, UA Negative    Ketones, UA Negative    Spec Grav, UA 1.010 1.010 - 1.025   Blood, UA Negative    pH, UA 6.0 5.0 - 8.0   Protein, UA Negative Negative   Urobilinogen, UA 0.2 0.2 or 1.0 E.U./dL   Nitrite, UA Negative    Leukocytes, UA Negative Negative   Appearance     Odor      Assessment and Plan:   [name] was seen today for bumps on left sid of neck, dysuria and insomnia.  Diagnoses and all orders for this visit:  Dysuria UA negative. I do think her constipation is contributing to this. Discussed option of empirically treating with keflex until culture returns however she would like to wait until culture returns. Worsening precautions advised. Recommended working on bowel regimen, more fiber and water to help with her symptoms. -     POCT urinalysis dipstick -     Urine Culture  Insomnia, unspecified type Discussed need for better sleep hygiene. Will trial atarax. If this fails, I will defer any further medication changes to PCP.  Lymphadenopathy Given duration of lymph node, will obtain u/s for further evaluation. Labs pending. -     CBC with Differential/Platelet -     US Soft Tissue Head/Neck; Future -     Comprehensive metabolic panel  Other orders -     hydrOXYzine (ATARAX/VISTARIL) 25 MG tablet; Take 1 tablet (25 mg total) by mouth at bedtime as needed (insomnia). Take one 25 mg tablet 30-60 minutes prior to bedtime for insomnia, anxiety. May increase to two tablets. -     cephALEXin (KEFLEX) 500 MG capsule; Take 1 capsule (500 mg total) by mouth 3 (three) times daily for 5 days.  . Reviewed expectations re: course of current medical issues. . Discussed self-management of symptoms. . Outlined signs and symptoms indicating need for more acute intervention. . Patient verbalized understanding and all  questions were answered. . See orders for this visit as documented in the electronic medical record. . Patient received an After-Visit Summary.  CMA or LPN served as scribe during this visit. History, Physical, and Plan performed by medical provider. The above documentation has been reviewed and is accurate and complete.   Stacy MottoSamantha Otis Burress, PA-C

## 2019-01-26 LAB — URINE CULTURE
MICRO NUMBER:: 689017
SPECIMEN QUALITY:: ADEQUATE

## 2019-02-20 ENCOUNTER — Other Ambulatory Visit: Payer: Self-pay | Admitting: Rheumatology

## 2019-02-20 DIAGNOSIS — M359 Systemic involvement of connective tissue, unspecified: Secondary | ICD-10-CM

## 2019-02-21 NOTE — Telephone Encounter (Signed)
Last Visit : 11/16/18  Next Visit: 04/14/19 Labs: 01/25/19 WBC 3.4 Neutrophils Relative % 41.1 Monocytes Relative 14.1 PLQ Eye Exam: 03/31/18 WNL   Okay to refill per Dr. Estanislado Pandy

## 2019-02-22 ENCOUNTER — Telehealth: Payer: Self-pay | Admitting: Rheumatology

## 2019-02-22 NOTE — Telephone Encounter (Signed)
Patient called stating the last two prescriptions of Plaquenil that have been sent to [facility] are the generic version of Plaquenil.  Patient states the pills are no as effective as the non-generic medication.  Patient states she would rather pay $60 for non-generic than the $3.00.  Patient states she did not pick up the prescription at [facility] and is requesting a return call.

## 2019-02-22 NOTE — Telephone Encounter (Signed)
Attempted to contact the patient and unable to leave a message, voicemail is full. Per [employer], clinical pharmacist, patient should contact the pharmacy and have them run the prescription as DAW2 and they should be able to give her prescription as brand name.

## 2019-02-23 ENCOUNTER — Other Ambulatory Visit: Payer: Self-pay | Admitting: Family Medicine

## 2019-03-31 ENCOUNTER — Other Ambulatory Visit: Payer: Self-pay | Admitting: Family Medicine

## 2019-03-31 ENCOUNTER — Ambulatory Visit
Admission: RE | Admit: 2019-03-31 | Discharge: 2019-03-31 | Disposition: A | Discharge status: Home / Self Care | Payer: 59 | Source: Ambulatory Visit | Attending: Physician Assistant | Admitting: Physician Assistant

## 2019-03-31 DIAGNOSIS — R591 Generalized enlarged lymph nodes: Secondary | ICD-10-CM

## 2019-03-31 NOTE — Progress Notes (Signed)
Office Visit Note  Patient: [name]             Date of Birth: 02-01-1976           MRN: [id number]             PCP: Orland Mustard, MD Referring: Orland Mustard, MD Visit Date: 04/14/2019 Occupation: @GUAROCC @  Subjective:  Swollen lymph node    History of Present Illness: [name] is a 43 y.o. female with history of autoimmune disease and osteoarthritis.  She is taking Plaquenil 200 mg 1 tablet by mouth twice daily M-F.  She denies any recent flares.  She states that Plaquenil continues to be very effective.  She reports that she has chronic pain in both knee joints but denies any joint swelling.  She states she intermittently has rashes and still experiences photosensitivity but wears sunscreen on a daily basis.  She has intermittent symptoms of rainouts which is worse with cooler weather.  She denies any ulcerations on her fingertips.  She denies any sores in her mouth or nose.  She denies any sicca symptoms.  She denies any hair loss.  Her level fatigue has improved since starting on Plaquenil.  She denies any recent low grade fevers.  She reports that she has a swollen lymph node on the left side of her neck and is scheduled for a lymph node biopsy tomorrow.  She states that she has had lymph node for a while but it started causing discomfort for the past 1 month.    Activities of Daily Living:  Patient reports morning stiffness for  2 minutes.   Patient reports nocturnal pain.  Difficulty dressing/grooming: Denies Difficulty climbing stairs: Denies Difficulty getting out of chair: Denies Difficulty using hands for taps, buttons, cutlery, and/or writing: Denies  Review of Systems  Constitutional: Negative for fatigue.  HENT: Negative for mouth sores, mouth dryness and nose dryness.   Eyes: Negative for pain, visual disturbance and dryness.  Respiratory: Negative for cough, hemoptysis, shortness of breath and difficulty breathing.    Cardiovascular: Negative for chest pain, palpitations, hypertension and swelling in legs/feet.  Gastrointestinal: Positive for constipation. Negative for blood in stool and diarrhea.  Endocrine: Negative for increased urination.  Genitourinary: Negative for painful urination.  Musculoskeletal: Positive for arthralgias, joint pain and morning stiffness. Negative for joint swelling, myalgias, muscle weakness, muscle tenderness and myalgias.  Skin: Positive for color change. Negative for pallor, rash, hair loss, nodules/bumps, skin tightness, ulcers and sensitivity to sunlight.  Allergic/Immunologic: Negative for susceptible to infections.  Neurological: Negative for dizziness, numbness, headaches and weakness.  Hematological: Positive for swollen glands.  Psychiatric/Behavioral: Positive for sleep disturbance. Negative for depressed mood. The patient is not nervous/anxious.     PMFS History:  Patient Active Problem List   Diagnosis Date Noted  . Autoimmune disorder (HCC) 03/25/2018  . Neutropenia (HCC) 01/04/2018  . ANA positive 01/04/2018  . 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 Abnormal uterine bleeding   . History of chicken pox   . Urinary tract infection 12/2014  . Vaginal Pap smear, abnormal     Family History  Problem Relation Age of Onset  . Diabetes Mother   . Hypertension Mother   . High Cholesterol Mother   . Breast cancer Paternal Grandmother   . Healthy Sister   . Healthy Brother   . Healthy Brother   . Healthy Son   . Healthy  Son   . Adolm Joseph Son    Past Surgical History:  Procedure Laterality Date  . CESAREAN SECTION    . LEEP     Social History   Social History Narrative  . Not on file   Immunization History  Administered Date(s) Administered  . Influenza,inj,Quad PF,6+ Mos 06/24/2018, 04/08/2019  . Pneumococcal Polysaccharide-23 03/25/2018  . Tdap 11/23/2014     Objective: Vital Signs: BP  115/74 (BP Location: Left Arm, Patient Position: Sitting, Cuff Size: Normal)   Pulse 79   Resp 12   Ht 5\' 1"  (1.549 m)   Wt 156 lb 3.2 oz (70.9 kg)   LMP 03/22/2019   BMI 29.51 kg/m    Physical Exam Vitals signs and nursing note reviewed.  Constitutional:      Appearance: She is well-developed.  HENT:     Head: Normocephalic and atraumatic.  Eyes:     Conjunctiva/sclera: Conjunctivae normal.  Neck:     Musculoskeletal: Normal range of motion.  Cardiovascular:     Rate and Rhythm: Normal rate and regular rhythm.     Heart sounds: Normal heart sounds.  Pulmonary:     Effort: Pulmonary effort is normal.     Breath sounds: Normal breath sounds.  Abdominal:     General: Bowel sounds are normal.     Palpations: Abdomen is soft.  Lymphadenopathy:     Cervical: Cervical adenopathy (Enlarged left supraclavicular lymph node) present.  Skin:    General: Skin is warm and dry.     Capillary Refill: Capillary refill takes less than 2 seconds.  Neurological:     Mental Status: She is alert and oriented to person, place, and time.  Psychiatric:        Behavior: Behavior normal.      Musculoskeletal Exam: C-spine, thoracic spine, lumbar spine good range of motion with no discomfort.  No midline spinal tenderness.  No SI joint tenderness.  Shoulder joints and elbow joints, wrist joints, MCPs, PIPs and DIPs good range of motion no synovitis.  She has complete fist formation bilaterally.  Hip joints, knee joints and ankle joints, MTPs, PIPs, DIPs good range of motion no synovitis.  No warmth or effusion bilateral knee joints.  No tenderness or swelling of ankle joints.  CDAI Exam: CDAI Score: - Patient Global: -; Provider Global: - Swollen: -; Tender: - Joint Exam   No joint exam has been documented for this visit   There is currently no information documented on the homunculus. Go to the Rheumatology activity and complete the homunculus joint exam.  Investigation: No additional  findings.  Imaging: US Soft Tissue Head/neck  Result Date: 04/01/2019 CLINICAL DATA:  Prominent lymph node and left supraclavicular area EXAM: ULTRASOUND OF HEAD/NECK SOFT TISSUES TECHNIQUE: Ultrasound examination of the head and neck soft tissues was performed in the area of clinical concern. COMPARISON:  None. FINDINGS: Confirmed lymph node in the supraclavicular region which has a homogeneous cortex and preserved hilum. The node measures 5 x 12 x 20 mm. No regional inflammation or mass is seen. There is history of autoimmune disorder, but usually related reactive nodal enlargement is bilateral and axillary. IMPRESSION: Single enlarged left supraclavicular lymph node with normal morphology, favoring a reactive process in isolation. Please correlate for regional inflammation and please correlate with breast exam versus mammography. Electronically Signed   By: Marnee Spring M.D.   On: 04/01/2019 08:55    Recent Labs: Lab Results  Component Value Date   WBC 3.4 (L) 01/25/2019  HGB 12.5 01/25/2019   PLT 230.0 01/25/2019   NA 138 01/25/2019   K 3.6 01/25/2019   CL 106 01/25/2019   CO2 23 01/25/2019   GLUCOSE 88 01/25/2019   BUN 12 01/25/2019   CREATININE 0.75 01/25/2019   BILITOT 0.4 01/25/2019   ALKPHOS 59 01/25/2019   AST 20 01/25/2019   ALT 14 01/25/2019   PROT 7.3 01/25/2019   ALBUMIN 4.5 01/25/2019   CALCIUM 9.0 01/25/2019   GFRAA 104 09/10/2018    Speciality Comments: PLQ Eye Exam: 03/31/18 WNL @ [facility] Follow up in 1 year  Procedures:  No procedures performed Allergies: Patient has no known allergies.  .    Assessment / Plan:     Visit Diagnoses: Autoimmune disease (HCC) - Positive ANA 1: 5120 nucleolar, positive SSA a, neutropenia, photosensitivity, oral ulcers and arthritis. She has not had any signs or symptoms of a flare recently.  She is clinically doing well on Plaquenil 200 mg 1 tablet twice daily Monday through Friday.  She has not missed any  doses of Plaquenil recently.  She has no synovitis on examination.  She has chronic pain in both knee joints but has good range of motion with no warmth or effusion.  She has not had any oral or nasal ulcerations recently.  No sicca symptoms.  She continues to have intermittent rashes and photosensitivity.  She wears sunscreen on a daily basis.  No malar rash noted.  She has an enlarged left supraclavicular lymph node, which is being biopsied tomorrow.  She will notify us of the biopsy results.  She has not had any worsening fatigue or low-grade fevers recently.  She has not had any chest pain, shortness of breath, or palpitations.  She will continue taking Plaquenil as prescribed.  She does not need any refills at this time.  We will check autoimmune lab work today.  She was advised to notify us if she develops any new or worsening symptoms.  She will follow-up in the office in 5 months.- Plan: COMPLETE METABOLIC PANEL WITH GFR, CBC with Differential/Platelet, Urinalysis, Routine w reflex microscopic, Anti-DNA antibody, double-stranded, C3 and C4, Sedimentation rate  High risk medication use -  Plaquenil 200 mg 1 tablet twice daily Monday through Friday only.  Last Plaquenil eye exam 03/31/2018.  Most recent CBC/CMP within normal limits except borderline low WBC on 01/25/2019  Primary osteoarthritis of both knees: She has good range of motion of bilateral knee joints.  No warmth or effusion was noted.  She has chronic pain in both knee joints but she does not notice any joint swelling.  She is scheduled for a left supraclavicular lymph node ultrasound-guided biopsy tomorrow on 04/15/2019.  She noticed the enlarged lymph node several months ago but is started to cause discomfort and has been growing in size for the past 1 month.  She was advised to notify us of the biopsy results.  Primary osteoarthritis of both feet: She has no feet pain or joint swelling.   Other fatigue: Her fatigue has improved since  starting on PLQ.   History of neutropenia -CBC will be drawn today. Plan: CBC with Differential/Platelet  Orders: Orders Placed This Encounter  Procedures  . COMPLETE METABOLIC PANEL WITH GFR  . CBC with Differential/Platelet  . Urinalysis, Routine w reflex microscopic  . Anti-DNA antibody, double-stranded  . C3 and C4  . Sedimentation rate   No orders of the defined types were placed in this encounter.  Follow-Up Instructions: Return in about 5 months (around 09/12/2019) for Autoimmune Disease, Osteoarthritis.   Gearldine Bienenstock, PA-C   I examined and evaluated the patient with Sherron Ales PA.  Patient is clinically doing well.  She has a lymph node which will get biopsied.  I did not see any synovitis on examination.  The plan of care was discussed as noted above.  Pollyann Savoy, MD  Note - This record has been created using Animal nutritionist.  Chart creation errors have been sought, but may not always  have been located. Such creation errors do not reflect on  the standard of medical care.

## 2019-04-06 ENCOUNTER — Other Ambulatory Visit: Payer: Self-pay | Admitting: Family Medicine

## 2019-04-06 DIAGNOSIS — R590 Localized enlarged lymph nodes: Secondary | ICD-10-CM

## 2019-04-08 ENCOUNTER — Other Ambulatory Visit: Payer: Self-pay | Admitting: Family Medicine

## 2019-04-08 ENCOUNTER — Encounter: Payer: Self-pay | Admitting: Family Medicine

## 2019-04-08 ENCOUNTER — Other Ambulatory Visit: Payer: Self-pay

## 2019-04-08 ENCOUNTER — Ambulatory Visit (INDEPENDENT_AMBULATORY_CARE_PROVIDER_SITE_OTHER): Payer: 59 | Admitting: Family Medicine

## 2019-04-08 VITALS — BP 132/80 | HR 65 | Temp 98.1°F | Ht 61.0 in | Wt 154.0 lb

## 2019-04-08 DIAGNOSIS — R590 Localized enlarged lymph nodes: Secondary | ICD-10-CM | POA: Diagnosis not present

## 2019-04-08 DIAGNOSIS — Z23 Encounter for immunization: Secondary | ICD-10-CM

## 2019-04-08 NOTE — Progress Notes (Signed)
Patient: [name] MRN: [id number] DOB: Mar 24, 1976 PCP: Orland Mustard, MD     Subjective:  Chief Complaint  Patient presents with  . bump on L neck    HPI: The patient is a 43 y.o. female who presents today for enlarged lymph node of left cervical chain. Seen in July for this. Just had ultrasound done which showed 5x12x33mm lymph node. No regional inflammation or mass seen. Lymph node is now tender and she feels like it has grown. No fever/chills. NO other enlarged lymph nodes. Peripheral smear done in June 2019. Few lymphocytes appear reactive. She denies fever/chills, night sweats, easy bruising. Is fatigued but could be secondary to job and autoimmune disease.   Flu shot today   Review of Systems  Constitutional: Positive for fatigue.  HENT: Positive for rhinorrhea. Negative for congestion, postnasal drip and sore throat.   Eyes: Negative for visual disturbance.  Respiratory: Negative for shortness of breath.   Cardiovascular: Negative for chest pain, palpitations and leg swelling.  Gastrointestinal: Positive for constipation. Negative for abdominal pain, diarrhea, nausea and vomiting.  Musculoskeletal: Positive for arthralgias and back pain. Negative for neck pain and neck stiffness.       C/o pain in upper back and L shoulder  Neurological: Positive for dizziness and headaches.  Psychiatric/Behavioral: Positive for sleep disturbance.    Allergies Patient has No Known Allergies.  Past Medical History Patient  has a past medical history of Abnormal uterine bleeding, History of chicken pox, Urinary tract infection (12/2014), and Vaginal Pap smear, abnormal.  Surgical History Patient  has a past surgical history that includes Cesarean section and LEEP.  Family History Pateint's family history includes Breast cancer in her paternal grandmother; Diabetes in her mother; Healthy in her brother, brother, sister, son, son, and son; High Cholesterol in her mother;  Hypertension in her mother.  Social History Patient  reports that she has been smoking cigarettes. She has never used smokeless tobacco. She reports current alcohol use of about 2.0 standard drinks of alcohol per week. She reports that she does not use drugs.    Objective: Vitals:   04/08/19 0905  BP: 132/80  Pulse: 65  Temp: 98.1 F (36.7 C)  TempSrc: Skin  SpO2: 95%  Weight: 154 lb (69.9 kg)  Height: 5\' 1"  (1.549 m)    Body mass index is 29.1 kg/m.  Physical Exam Vitals signs reviewed.  Constitutional:      Appearance: Normal appearance. She is normal weight.  HENT:     Head: Normocephalic and atraumatic.  Cardiovascular:     Rate and Rhythm: Normal rate and regular rhythm.     Heart sounds: Normal heart sounds.  Pulmonary:     Effort: Pulmonary effort is normal.     Breath sounds: Normal breath sounds.  Abdominal:     General: Abdomen is flat. Bowel sounds are normal.     Palpations: Abdomen is soft.  Lymphadenopathy:     Cervical: Cervical adenopathy present.     Right cervical: No superficial or posterior cervical adenopathy.    Left cervical: Superficial cervical adenopathy present.     Upper Body:     Right upper body: No supraclavicular, axillary or pectoral adenopathy.     Left upper body: No supraclavicular, axillary or pectoral adenopathy.     Lower Body: No right inguinal adenopathy. No left inguinal adenopathy.  Skin:    General: Skin is warm and dry.     Findings: No rash.  Neurological:  General: No focal deficit present.     Mental Status: She is alert and oriented to person, place, and time.  Psychiatric:        Mood and Affect: Mood normal.        Behavior: Behavior normal.       Assessment/plan: 1. Enlarged lymph node in neck Now is painful and she thinks is growing. Doesn't feel pathological, but will send to IR for biopsy. Also will call radiology as read says supraclavicular lymph node and it's actually her cervical chain.  -  Ambulatory referral to Interventional Radiology  -flu shot today   Return in about 1 month (around 05/09/2019) for annual! .     @AWME @ 04/08/2019

## 2019-04-11 ENCOUNTER — Other Ambulatory Visit: Payer: Self-pay | Admitting: Family Medicine

## 2019-04-11 NOTE — Addendum Note (Signed)
Addended by: Orma Flaming on: 04/11/2019 10:28 AM   Modules accepted: Orders

## 2019-04-12 ENCOUNTER — Encounter (HOSPITAL_COMMUNITY): Payer: Self-pay

## 2019-04-12 NOTE — Progress Notes (Signed)
Tahirah Sara Female, 43 y.o., 08/27/1975 MRN:  [id number] Phone:  [phone] (H) ... Preferred Language: Spanish PCP:  Orma Flaming, MD Primary Cvg:  Cigna/Cigna Managed Next Appt With Radiology ([facility]-US 2) 04/15/2019 at 1:00 PM  RE: Biopsy Received: Today Message Contents  Corrie Mckusick, DO  [facility]  OK for US guided FNA of left supra-clavicular lymph node.    [place] 03/31/2019.   Earleen Newport   Previous Messages  ----- Message -----  From: Lenore Cordia  Sent: 04/11/2019  4:30 PM EDT  To: Ir Procedure Requests  Subject: Biopsy                      Procedure Requested: US Biopsy (lymph Nodes)   Reason for Procedure: enlarged lymph node for months. painful/growing    Provider Requesting: Orma Flaming  Provider Telephone: [phone]    Other Info: Rad exam in Epic

## 2019-04-14 ENCOUNTER — Ambulatory Visit (INDEPENDENT_AMBULATORY_CARE_PROVIDER_SITE_OTHER): Payer: 59 | Admitting: Rheumatology

## 2019-04-14 ENCOUNTER — Other Ambulatory Visit: Payer: Self-pay

## 2019-04-14 ENCOUNTER — Encounter: Payer: Self-pay | Admitting: Rheumatology

## 2019-04-14 ENCOUNTER — Other Ambulatory Visit: Payer: Self-pay | Admitting: Radiology

## 2019-04-14 VITALS — BP 115/74 | HR 79 | Resp 12 | Ht 61.0 in | Wt 156.2 lb

## 2019-04-14 DIAGNOSIS — M19071 Primary osteoarthritis, right ankle and foot: Secondary | ICD-10-CM | POA: Diagnosis not present

## 2019-04-14 DIAGNOSIS — Z79899 Other long term (current) drug therapy: Secondary | ICD-10-CM

## 2019-04-14 DIAGNOSIS — R5383 Other fatigue: Secondary | ICD-10-CM

## 2019-04-14 DIAGNOSIS — M170 Bilateral primary osteoarthritis of knee: Secondary | ICD-10-CM

## 2019-04-14 DIAGNOSIS — M359 Systemic involvement of connective tissue, unspecified: Secondary | ICD-10-CM | POA: Diagnosis not present

## 2019-04-14 DIAGNOSIS — M19072 Primary osteoarthritis, left ankle and foot: Secondary | ICD-10-CM

## 2019-04-14 DIAGNOSIS — Z862 Personal history of diseases of the blood and blood-forming organs and certain disorders involving the immune mechanism: Secondary | ICD-10-CM

## 2019-04-15 ENCOUNTER — Ambulatory Visit (HOSPITAL_COMMUNITY)
Admission: RE | Admit: 2019-04-15 | Discharge: 2019-04-15 | Disposition: A | Discharge status: Home / Self Care | Payer: 59 | Source: Ambulatory Visit | Attending: Family Medicine | Admitting: Family Medicine

## 2019-04-15 ENCOUNTER — Other Ambulatory Visit: Payer: Self-pay

## 2019-04-15 ENCOUNTER — Ambulatory Visit (HOSPITAL_COMMUNITY)
Admission: RE | Admit: 2019-04-15 | Discharge: 2019-04-15 | Disposition: A | Discharge status: Home / Self Care | Payer: 59 | Source: Ambulatory Visit

## 2019-04-15 ENCOUNTER — Encounter (HOSPITAL_COMMUNITY): Payer: Self-pay

## 2019-04-15 DIAGNOSIS — F17210 Nicotine dependence, cigarettes, uncomplicated: Secondary | ICD-10-CM | POA: Insufficient documentation

## 2019-04-15 DIAGNOSIS — Z791 Long term (current) use of non-steroidal anti-inflammatories (NSAID): Secondary | ICD-10-CM | POA: Insufficient documentation

## 2019-04-15 DIAGNOSIS — Z803 Family history of malignant neoplasm of breast: Secondary | ICD-10-CM | POA: Diagnosis not present

## 2019-04-15 DIAGNOSIS — Z833 Family history of diabetes mellitus: Secondary | ICD-10-CM | POA: Diagnosis not present

## 2019-04-15 DIAGNOSIS — Z79899 Other long term (current) drug therapy: Secondary | ICD-10-CM | POA: Insufficient documentation

## 2019-04-15 DIAGNOSIS — M1990 Unspecified osteoarthritis, unspecified site: Secondary | ICD-10-CM | POA: Diagnosis not present

## 2019-04-15 DIAGNOSIS — R590 Localized enlarged lymph nodes: Secondary | ICD-10-CM | POA: Insufficient documentation

## 2019-04-15 LAB — CBC WITH DIFFERENTIAL/PLATELET
Absolute Monocytes: 384 cells/uL (ref 200–950)
Basophils Absolute: 48 cells/uL (ref 0–200)
Basophils Relative: 1.6 %
Eosinophils Absolute: 60 cells/uL (ref 15–500)
Eosinophils Relative: 2 %
HCT: 35.9 % (ref 35.0–45.0)
Hemoglobin: 11.9 g/dL (ref 11.7–15.5)
Lymphs Abs: 1254 cells/uL (ref 850–3900)
MCH: 31.5 pg (ref 27.0–33.0)
MCHC: 33.1 g/dL (ref 32.0–36.0)
MCV: 95 fL (ref 80.0–100.0)
MPV: 9.4 fL (ref 7.5–12.5)
Monocytes Relative: 12.8 %
Neutro Abs: 1254 cells/uL — ABNORMAL LOW (ref 1500–7800)
Neutrophils Relative %: 41.8 %
Platelets: 230 10*3/uL (ref 140–400)
RBC: 3.78 10*6/uL — ABNORMAL LOW (ref 3.80–5.10)
RDW: 12.3 % (ref 11.0–15.0)
Total Lymphocyte: 41.8 %
WBC: 3 10*3/uL — ABNORMAL LOW (ref 3.8–10.8)

## 2019-04-15 LAB — URINALYSIS, ROUTINE W REFLEX MICROSCOPIC
Bilirubin Urine: NEGATIVE
Glucose, UA: NEGATIVE
Hgb urine dipstick: NEGATIVE
Ketones, ur: NEGATIVE
Leukocytes,Ua: NEGATIVE
Nitrite: NEGATIVE
Protein, ur: NEGATIVE
Specific Gravity, Urine: 1.007 (ref 1.001–1.03)
pH: 6 (ref 5.0–8.0)

## 2019-04-15 LAB — COMPLETE METABOLIC PANEL WITH GFR
AG Ratio: 1.4 (calc) (ref 1.0–2.5)
ALT: 16 U/L (ref 6–29)
AST: 22 U/L (ref 10–30)
Albumin: 4.2 g/dL (ref 3.6–5.1)
Alkaline phosphatase (APISO): 49 U/L (ref 31–125)
BUN: 11 mg/dL (ref 7–25)
CO2: 29 mmol/L (ref 20–32)
Calcium: 9 mg/dL (ref 8.6–10.2)
Chloride: 104 mmol/L (ref 98–110)
Creat: 0.71 mg/dL (ref 0.50–1.10)
GFR, Est African American: 123 mL/min/{1.73_m2} (ref >=60)
GFR, Est Non African American: 107 mL/min/{1.73_m2} (ref >=60)
Globulin: 3 g/dL (calc) (ref 1.9–3.7)
Glucose, Bld: 96 mg/dL (ref 65–99)
Potassium: 3.7 mmol/L (ref 3.5–5.3)
Sodium: 139 mmol/L (ref 135–146)
Total Bilirubin: 0.3 mg/dL (ref 0.2–1.2)
Total Protein: 7.2 g/dL (ref 6.1–8.1)

## 2019-04-15 LAB — C3 AND C4
C3 Complement: 94 mg/dL (ref 83–193)
C4 Complement: 21 mg/dL (ref 15–57)

## 2019-04-15 LAB — ANTI-DNA ANTIBODY, DOUBLE-STRANDED: ds DNA Ab: 1 IU/mL

## 2019-04-15 LAB — SEDIMENTATION RATE: Sed Rate: 6 mm/h (ref 0–20)

## 2019-04-15 MED ORDER — LIDOCAINE HCL 1 % IJ SOLN
INTRAMUSCULAR | Status: AC
  Filled 2019-04-15: qty 20

## 2019-04-15 MED ORDER — SODIUM CHLORIDE 0.9 % IV SOLN
INTRAVENOUS | Status: DC
  Administered 2019-04-15: 13:00:00 via INTRAVENOUS

## 2019-04-15 MED ORDER — MIDAZOLAM HCL 2 MG/2ML IJ SOLN
INTRAMUSCULAR | Status: AC
  Filled 2019-04-15: qty 4

## 2019-04-15 MED ORDER — MIDAZOLAM HCL 2 MG/2ML IJ SOLN
INTRAMUSCULAR | Status: AC | PRN
  Administered 2019-04-15 (×2): 1 mg via INTRAVENOUS

## 2019-04-15 MED ORDER — FENTANYL CITRATE (PF) 100 MCG/2ML IJ SOLN
INTRAMUSCULAR | Status: AC
  Filled 2019-04-15: qty 2

## 2019-04-15 MED ORDER — LIDOCAINE HCL (PF) 1 % IJ SOLN
INTRAMUSCULAR | Status: AC | PRN
  Administered 2019-04-15: 5 mL

## 2019-04-15 MED ORDER — FENTANYL CITRATE (PF) 100 MCG/2ML IJ SOLN
INTRAMUSCULAR | Status: AC | PRN
  Administered 2019-04-15: 50 ug via INTRAVENOUS

## 2019-04-15 NOTE — Consult Note (Signed)
Chief Complaint: Patient was seen in consultation today for image guided biopsy of left posterior cervical lymph node  Referring Physician(s): Wolfe,Allison  Supervising Physician: Malachy Moan  Patient Status: [facility] - Out-pt  History of Present Illness: [name] is a 43 y.o. female smoker with hx autoimmune disease, OA and several month hx of tender left posterior cervical lymph node. Request received for image guided aspiration/biopsy of the lymph node. No prior hx cancer.    Past Medical History:  Diagnosis Date  . Abnormal uterine bleeding   . History of chicken pox   . Urinary tract infection 12/2014  . Vaginal Pap smear, abnormal     Past Surgical History:  Procedure Laterality Date  . CESAREAN SECTION    . LEEP      Allergies: Patient has no known allergies.  Medications: Prior to Admission medications   Medication Sig Start Date End Date Taking? Authorizing Provider  acetaminophen (TYLENOL) 500 MG tablet Take 1,000 mg by mouth every 6 (six) hours as needed for mild pain or moderate pain.    [provider]  folic acid (FOLVITE) 1 MG tablet TAKE 1 TABLET BY MOUTH DAILY 03/31/19   Orland Mustard, MD  gabapentin (NEURONTIN) 300 MG capsule TAKE ONE CAPSULE BY MOUTH TWICE DAILY AS NEEDED 02/23/19   Orland Mustard, MD  hydroxychloroquine (PLAQUENIL) 200 MG tablet TAKE 1 TABLET BY MOUTH TWICE DAILY MONDAY- FRIDAY AND NONE ON SATURDAY OR SUNDAY 02/21/19   Pollyann Savoy, MD  hydrOXYzine (ATARAX/VISTARIL) 25 MG tablet Take 1 tablet (25 mg total) by mouth at bedtime as needed (insomnia). Take one 25 mg tablet 30-60 minutes prior to bedtime for insomnia, anxiety. May increase to two tablets. 01/25/19   Jarold Motto, PA  meloxicam (MOBIC) 15 MG tablet Take 1 tablet (15 mg total) by mouth daily. 12/27/18   Orland Mustard, MD  RESTASIS 0.05 % ophthalmic emulsion  03/31/18   [provider]     Family History  Problem Relation Age of  Onset  . Diabetes Mother   . Hypertension Mother   . High Cholesterol Mother   . Breast cancer Paternal Grandmother   . Healthy Sister   . Healthy Brother   . Healthy Brother   . Healthy Son   . Healthy Son   . Healthy Son     Social History   Socioeconomic History  . Marital status: Single    Spouse name: Not on file  . Number of children: Not on file  . Years of education: Not on file  . Highest education level: Not on file  Occupational History  . Not on file  Social Needs  . Financial resource strain: Not on file  . Food insecurity    Worry: Not on file    Inability: Not on file  . Transportation needs    Medical: Not on file    Non-medical: Not on file  Tobacco Use  . Smoking status: Light Tobacco Smoker    Types: Cigarettes  . Smokeless tobacco: Never Used  . Tobacco comment: 4 CIG WEEKLY  Substance and Sexual Activity  . Alcohol use: Yes    Alcohol/week: 2.0 standard drinks    Types: 2 Cans of beer per week    Comment: occ  . Drug use: No  . Sexual activity: Not Currently    Birth control/protection: None  Lifestyle  . Physical activity    Days per week: Not on file    Minutes per session: Not on file  .  Stress: Not on file  Relationships  . Social Herbalist on phone: Not on file    Gets together: Not on file    Attends religious service: Not on file    Active member of club or organization: Not on file    Attends meetings of clubs or organizations: Not on file    Relationship status: Not on file  Other Topics Concern  . Not on file  Social History Narrative  . Not on file      Review of Systems denies fever,HA, CP,dyspnea, cough, abd/back pain,N/V or bleeding  Vital Signs:BP 115/74 heart rate 79, respirations 12 LMP 03/22/2019   Physical Exam awake, alert.  Chest clear to auscultation bilaterally.  Heart with regular rate and rhythm.  Abdomen soft, positive bowel sounds, nontender.  No lower extremity edema.  Mildly enlarged  left posterior cervical lymph node, mildly tender to palpation.  Imaging: US Soft Tissue Head/neck  Result Date: 04/01/2019 CLINICAL DATA:  Prominent lymph node and left supraclavicular area EXAM: ULTRASOUND OF HEAD/NECK SOFT TISSUES TECHNIQUE: Ultrasound examination of the head and neck soft tissues was performed in the area of clinical concern. COMPARISON:  None. FINDINGS: Confirmed lymph node in the supraclavicular region which has a homogeneous cortex and preserved hilum. The node measures 5 x 12 x 20 mm. No regional inflammation or mass is seen. There is history of autoimmune disorder, but usually related reactive nodal enlargement is bilateral and axillary. IMPRESSION: Single enlarged left supraclavicular lymph node with normal morphology, favoring a reactive process in isolation. Please correlate for regional inflammation and please correlate with breast exam versus mammography. Electronically Signed   By: Monte Fantasia M.D.   On: 04/01/2019 08:55    Labs:  CBC: Recent Labs    04/16/18 1523 09/10/18 1409 01/25/19 1440 04/14/19 1507  WBC 3.8 3.4* 3.4* 3.0*  HGB 12.5 12.3 12.5 11.9  HCT 36.7 36.0 37.3 35.9  PLT 250 250 230.0 230    COAGS: No results for input(s): INR, APTT in the last 8760 hours.  BMP: Recent Labs    04/16/18 1523 09/10/18 1409 01/25/19 1440 04/14/19 1507  NA 139 140 138 139  K 4.5 3.8 3.6 3.7  CL 103 105 106 104  CO2 31 28 23 29   GLUCOSE 88 82 88 96  BUN 10 10 12 11   CALCIUM 9.8 9.2 9.0 9.0  CREATININE 0.84 0.82 0.75 0.71  GFRNONAA 88 90  --  107  GFRAA 101 104  --  123    LIVER FUNCTION TESTS: Recent Labs    04/16/18 1523 09/10/18 1409 01/25/19 1440 04/14/19 1507  BILITOT 0.4 0.5 0.4 0.3  AST 28 28 20 22   ALT 21 21 14 16   ALKPHOS  --   --  59  --   PROT 7.3 7.1 7.3 7.2  ALBUMIN  --   --  4.5  --     TUMOR MARKERS: No results for input(s): AFPTM, CEA, CA199, CHROMGRNA in the last 8760 hours.  Assessment and Plan: 43 y.o. female  smoker with hx autoimmune disease, OA and several month hx of tender left posterior cervical lymph node. Request received for image guided aspiration/biopsy of the lymph node. No prior hx cancer. Risks and benefits of procedure was discussed with the patient via interpreter including, but not limited to bleeding, infection, damage to adjacent structures or low yield requiring additional tests or no biopsy if not deemed amenable to biopsy by imaging today.  All of  the questions were answered and there is agreement to proceed.  Consent signed and in chart.    Thank you for this interesting consult.  I greatly enjoyed meeting Molli KnockReyna Mariche-Rodriguez and look forward to participating in their care.  A copy of this report was sent to the requesting provider on this date.  Electronically Signed: D. Jeananne RamaKevin Leina Babe, PA-C 04/15/2019, 12:36 PM   I spent a total of  25 minutes   in face to face in clinical consultation, greater than 50% of which was counseling/coordinating care for image guided biopsy of left posterior cervical lymph node

## 2019-04-15 NOTE — Discharge Instructions (Signed)
Moderate Conscious Sedation, Adult, Care After °These instructions provide you with information about caring for yourself after your procedure. Your health care provider may also give you more specific instructions. Your treatment has been planned according to current medical practices, but problems sometimes occur. Call your health care provider if you have any problems or questions after your procedure. °What can I expect after the procedure? °After your procedure, it is common: °· To feel sleepy for several hours. °· To feel clumsy and have poor balance for several hours. °· To have poor judgment for several hours. °· To vomit if you eat too soon. °Follow these instructions at home: °For at least 24 hours after the procedure: ° °· Do not: °? Participate in activities where you could fall or become injured. °? Drive. °? Use heavy machinery. °? Drink alcohol. °? Take sleeping pills or medicines that cause drowsiness. °? Make important decisions or sign legal documents. °? Take care of children on your own. °· Rest. °Eating and drinking °· Follow the diet recommended by your health care provider. °· If you vomit: °? Drink water, juice, or soup when you can drink without vomiting. °? Make sure you have little or no nausea before eating solid foods. °General instructions °· Have a responsible adult stay with you until you are awake and alert. °· Take over-the-counter and prescription medicines only as told by your health care provider. °· If you smoke, do not smoke without supervision. °· Keep all follow-up visits as told by your health care provider. This is important. °Contact a health care provider if: °· You keep feeling nauseous or you keep vomiting. °· You feel light-headed. °· You develop a rash. °· You have a fever. °Get help right away if: °· You have trouble breathing. °This information is not intended to replace advice given to you by your health care provider. Make sure you discuss any questions you have  with your health care provider. °Document Released: 04/13/2013 Document Revised: 06/05/2017 Document Reviewed: 10/13/2015 °Elsevier Patient Education © 2020 Elsevier Inc. °Needle Biopsy, Care After °These instructions tell you how to care for yourself after your procedure. Your doctor may also give you more specific instructions. Call your doctor if you have any problems or questions. °What can I expect after the procedure? °After the procedure, it is common to have: °· Soreness. °· Bruising. °· Mild pain. °Follow these instructions at home: ° °· Return to your normal activities as told by your doctor. Ask your doctor what activities are safe for you. °· Take over-the-counter and prescription medicines only as told by your doctor. °· Wash your hands with soap and water before you change your bandage (dressing). If you cannot use soap and water, use hand sanitizer. °· Follow instructions from your doctor about: °? How to take care of your puncture site. °? When and how to change your bandage. °? When to remove your bandage. °· Check your puncture site every day for signs of infection. Watch for: °? Redness, swelling, or pain. °? Fluid or blood.  °? Pus or a bad smell. °? Warmth. °· Do not take baths, swim, or use a hot tub until your doctor approves. Ask your doctor if you may take showers. You may only be allowed to take sponge baths. °· Keep all follow-up visits as told by your doctor. This is important. °Contact a doctor if you have: °· A fever. °· Redness, swelling, or pain at the puncture site, and it lasts longer than a few   days. °· Fluid, blood, or pus coming from the puncture site. °· Warmth coming from the puncture site. °Get help right away if: °· You have a lot of bleeding from the puncture site. °Summary °· After the procedure, it is common to have soreness, bruising, or mild pain at the puncture site. °· Check your puncture site every day for signs of infection, such as redness, swelling, or pain. °· Get  help right away if you have severe bleeding from your puncture site. °This information is not intended to replace advice given to you by your health care provider. Make sure you discuss any questions you have with your health care provider. °Document Released: 06/05/2008 Document Revised: 07/06/2017 Document Reviewed: 07/06/2017 °Elsevier Patient Education © 2020 Elsevier Inc. ° °

## 2019-04-15 NOTE — Procedures (Signed)
Interventional Radiology Procedure Note  Procedure: US guided FNA left cervical LN  Complications: None  Estimated Blood Loss: None  Recommendations: - Too superficial for core biopsy   Signed,  Criselda Peaches, MD

## 2019-04-18 LAB — CYTOLOGY - NON PAP

## 2019-04-18 NOTE — Progress Notes (Signed)
WBC count is low.  She has a history of neutropenia. Reviewed lab work with Dr. Estanislado Pandy. Please advise patient to return for lab work in 3 months.  CMP WNL.  UA normal.  Sed rate WNL.  DsDNA negative. Complements WNL.

## 2019-04-21 ENCOUNTER — Other Ambulatory Visit: Payer: Self-pay | Admitting: Family Medicine

## 2019-04-21 DIAGNOSIS — R590 Localized enlarged lymph nodes: Secondary | ICD-10-CM

## 2019-05-11 ENCOUNTER — Other Ambulatory Visit: Payer: Self-pay | Admitting: *Deleted

## 2019-05-11 DIAGNOSIS — M359 Systemic involvement of connective tissue, unspecified: Secondary | ICD-10-CM

## 2019-05-11 MED ORDER — HYDROXYCHLOROQUINE SULFATE 200 MG PO TABS: ORAL_TABLET | ORAL | 0 refills | Status: DC

## 2019-05-11 NOTE — Telephone Encounter (Signed)
Refill request received via fax  Last Visit: 04/14/19 Next Visit: 09/15/18 Labs: 04/14/19 WBC count is low. CMP WNL. Eye exam:  03/31/18 WNL  Okay to refill 30 day supply per Dr. Estanislado Pandy

## 2019-06-06 ENCOUNTER — Other Ambulatory Visit: Payer: Self-pay | Admitting: Rheumatology

## 2019-06-06 DIAGNOSIS — M359 Systemic involvement of connective tissue, unspecified: Secondary | ICD-10-CM

## 2019-06-06 NOTE — Telephone Encounter (Signed)
Patient is due to update PLQ eye exam.  Ok to send in 30 day supply of PLQ

## 2019-06-06 NOTE — Telephone Encounter (Signed)
Notes recorded by Stacy Neas, PA-C on 04/18/2019 at 12:24 PM EDT  WBC count is low. She has a history of neutropenia. Reviewed lab work with Dr. Estanislado Pandy. Please advise patient to return for lab work in 3 months.  CMP WNL. UA normal.  Sed rate WNL. DsDNA negative. Complements WNL.   Last RF 05/11/2019 Last appt 04/14/2019 Next appt 09/15/2018

## 2019-06-07 NOTE — Telephone Encounter (Signed)
Please contact patient. Thank you. 

## 2019-06-07 NOTE — Telephone Encounter (Signed)
Attempted to contact patient and left message on machine to advise patient we need updated PLQ eye exam. 30 days supply refilled.

## 2019-06-09 ENCOUNTER — Other Ambulatory Visit: Payer: Self-pay

## 2019-06-10 ENCOUNTER — Encounter: Payer: Self-pay | Admitting: Family Medicine

## 2019-06-10 ENCOUNTER — Ambulatory Visit (INDEPENDENT_AMBULATORY_CARE_PROVIDER_SITE_OTHER): Payer: 59 | Admitting: Family Medicine

## 2019-06-10 VITALS — BP 120/84 | HR 77 | Temp 98.3°F | Ht 61.0 in | Wt 148.6 lb

## 2019-06-10 DIAGNOSIS — F321 Major depressive disorder, single episode, moderate: Secondary | ICD-10-CM | POA: Diagnosis not present

## 2019-06-10 LAB — COMPREHENSIVE METABOLIC PANEL
ALT: 24 U/L (ref 0–35)
AST: 24 U/L (ref 0–37)
Albumin: 4.4 g/dL (ref 3.5–5.2)
Alkaline Phosphatase: 55 U/L (ref 39–117)
BUN: 9 mg/dL (ref 6–23)
CO2: 26 mEq/L (ref 19–32)
Calcium: 9.5 mg/dL (ref 8.4–10.5)
Chloride: 103 mEq/L (ref 96–112)
Creatinine, Ser: 0.83 mg/dL (ref 0.40–1.20)
GFR: 91.7 mL/min (ref >60.00)
Glucose, Bld: 104 mg/dL — ABNORMAL HIGH (ref 70–99)
Potassium: 4.3 mEq/L (ref 3.5–5.1)
Sodium: 138 mEq/L (ref 135–145)
Total Bilirubin: 0.5 mg/dL (ref 0.2–1.2)
Total Protein: 7.5 g/dL (ref 6.0–8.3)

## 2019-06-10 LAB — CBC WITH DIFFERENTIAL/PLATELET
Basophils Absolute: 0 10*3/uL (ref 0.0–0.1)
Basophils Relative: 1.7 % (ref 0.0–3.0)
Eosinophils Absolute: 0 10*3/uL (ref 0.0–0.7)
Eosinophils Relative: 0.8 % (ref 0.0–5.0)
HCT: 37.7 % (ref 36.0–46.0)
Hemoglobin: 12.8 g/dL (ref 12.0–15.0)
Lymphocytes Relative: 44.4 % (ref 12.0–46.0)
Lymphs Abs: 0.9 10*3/uL (ref 0.7–4.0)
MCHC: 34 g/dL (ref 30.0–36.0)
MCV: 94.6 fl (ref 78.0–100.0)
Monocytes Absolute: 0.3 10*3/uL (ref 0.1–1.0)
Monocytes Relative: 15.9 % — ABNORMAL HIGH (ref 3.0–12.0)
Neutro Abs: 0.8 10*3/uL — ABNORMAL LOW (ref 1.4–7.7)
Neutrophils Relative %: 37.2 % — ABNORMAL LOW (ref 43.0–77.0)
Platelets: 252 10*3/uL (ref 150.0–400.0)
RBC: 3.99 Mil/uL (ref 3.87–5.11)
RDW: 13.2 % (ref 11.5–15.5)
WBC: 2.1 10*3/uL — ABNORMAL LOW (ref 4.0–10.5)

## 2019-06-10 LAB — VITAMIN D 25 HYDROXY (VIT D DEFICIENCY, FRACTURES): VITD: 38.03 ng/mL (ref 30.00–100.00)

## 2019-06-10 LAB — TSH: TSH: 2.44 u[IU]/mL (ref 0.35–4.50)

## 2019-06-10 MED ORDER — FLUOXETINE HCL 20 MG PO TABS: 20.0000 mg | ORAL_TABLET | Freq: Every day | ORAL | 0 refills | Status: DC

## 2019-06-10 NOTE — Patient Instructions (Signed)
-starting you on medication for depression. You will take once day and I will see you in a month. Let me know if any issues with medication.   Try to start exercising and you could look into counseling.    Trastorno Metallurgist Major Depressive Disorder, Adult El trastorno depresivo mayor (TDM) es una afeccin de salud mental. El TDM suele hacerlo sentir triste, desesperanzado o impotente. Tambin puede provocar sntomas en el organismo. El TDM puede afectar lo siguiente:  El trabajo.  La escuela.  Las [employer].  Otras actividades habituales. El TDM puede ser de leve a muy intenso. Puede manifestarse solo una vez (TDM de episodio nico). Tambin puede presentarse varias veces (TDM recurrente). Entre sus sntomas principales, se incluyen los siguientes:  Sentirse triste, deprimido o irritable la mayor parte del [place].  Prdida del inters. Otros sntomas del TDM tambin incluyen:  Dormir demasiado o muy poco.  Comer demasiado o muy poco.  Cambios en el peso.  Sentirse cansado (fatigado) o tener poca energa.  Falta de valoracin propia.  Sentimiento de culpa.  Dificultad para tomar decisiones.  Dificultad para pensar claramente.  Pensamientos suicidas o de Market researcher a Producer, television/film/video.  Sensacin de debilidad.  Sensacin de Air cabin crew.  Evitar estar cerca de otras personas (aislarse). Siga estas indicaciones en su casa: Actividad  Haga lo siguiente tal como se lo haya indicado el mdico: ? Reanude sus actividades habituales. ? Haga actividad fsica regularmente. ? Pase tiempo al [employer]. Alcohol  Pregntele al mdico cmo se ve afectado el funcionamiento de los antidepresivos por el consumo de bebidas alcohlicas.  No beba alcohol. O bien, limite la cantidad que consume. ? Esto significa no ms de 1 medida por da si es mujer y no est Music therapist, y de 2 medidas por da si es hombre. Una medida equivale a lo siguiente:  12 onzas (355  ml) de cerveza.  5 onzas (150 ml) de vino.  1,5onzas (25ml) de bebidas espirituosas. Instrucciones generales  [employer] de venta libre y los recetados solamente como se lo haya indicado el mdico.  Consuma una dieta saludable.  Duerma lo suficiente.  Busque actividades que disfrute. Destine tiempo a hacerlas.  Considere participar en un grupo de apoyo. El mdico quizs pueda sugerirle alguno adecuado para usted.  Concurra a todas las visitas de seguimiento como se lo haya indicado el mdico. Esto es importante. Dnde encontrar ms informacin:  [employer] ([employer] on Mental Illness): ? www.nami.[place] Mental de [place] ([place]): ? https://carter.com/  Lnea Telefnica Nacional para la Prevencin del Suicidio (National Suicide Prevention Lifeline): ? 540 136 4687. La asistencia es gratuita y est disponible las 24horas. Comunquese con un mdico si:  Los sntomas empeoran.  Aparecen nuevos sntomas. Solicite ayuda de inmediato si:  Se autoagrede.  Ve, oye, sab[place], huele o siente cosas que no estn presentes (alucinaciones). Si alguna vez siente que puede lastimarse o Market researcher a los dems, o tiene pensamientos de poner fin a su vida, busque ayuda de inmediato. Puede dirigirse al servicio de emergencias ms cercano o comunicarse con:  El servicio de emergencias de su localidad (911 en EE.UU.).  Una lnea de asistencia al suicida y atencin en crisis, como la [place]: ? [phone]. Est disponible las 24 horas del da. Esta informacin no tiene Marine scientist el consejo del mdico. Asegrese de hacerle al mdico cualquier pregunta  que tenga. Document Released: 06/04/2015 Document Revised: 01/30/2017 Document Reviewed: 07/19/2012 Elsevier Patient Education  2020 [employer].

## 2019-06-10 NOTE — Progress Notes (Signed)
Patient: [name] MRN: [id number] DOB: 1975-09-19 PCP: Orland Mustard, MD     Subjective:  Chief Complaint  Patient presents with  . mood issues  . Depression   Interpreting service used. Interpreter in room for spanish translation.   HPI: The patient is a 43 y.o. female who presents today for depression. Her main complaint is she is just mad and sad. She denies any big event that has happened. Relationship and kids are good. Job is fine and family is fine. She used to be able to handle things and now she feels like she gets extremely angry. She is not able to rationally handle things. She denies the pandemic affecting her. She isn't sure if she is just tired of her day in and day out routine. She wonders if it's her hormones. She doesn't want to have sex. She is doing okay at her job. She does have thoughts of being better off dead because she is tired of feeling this way. She has no plan and that is why she is here. She thinks she may have struggled with this in the past, but never saw a doctor or was diagnosed. No known family history of mental health disorders, but they are all in [place]. She does not exercise. She has not done counseling, but seen her pastor. She is no longer interested in her hobbies.   Review of Systems  Constitutional: Positive for fatigue.  Eyes: Negative for visual disturbance.  Respiratory: Negative for shortness of breath.   Cardiovascular: Negative for chest pain.  Gastrointestinal: Positive for nausea. Negative for abdominal pain.  Psychiatric/Behavioral: Positive for dysphoric mood and sleep disturbance. Negative for behavioral problems, hallucinations, self-injury and suicidal ideas. The patient is not nervous/anxious and is not hyperactive.     Allergies Patient has No Known Allergies.  Past Medical History Patient  has a past medical history of Abnormal uterine bleeding, History of chicken pox, Urinary tract infection (12/2014), and  Vaginal Pap smear, abnormal.  Surgical History Patient  has a past surgical history that includes Cesarean section and LEEP.  Family History Pateint's family history includes Breast cancer in her paternal grandmother; Diabetes in her mother; Healthy in her brother, brother, sister, son, son, and son; High Cholesterol in her mother; Hypertension in her mother.  Social History Patient  reports that she has been smoking cigarettes. She has never used smokeless tobacco. She reports current alcohol use of about 2.0 standard drinks of alcohol per week. She reports that she does not use drugs.    Objective: Vitals:   06/10/19 0806  BP: 120/84  Pulse: 77  Temp: 98.3 F (36.8 C)  TempSrc: Skin  SpO2: 98%  Weight: 148 lb 9.6 oz (67.4 kg)  Height: 5\' 1"  (1.549 m)    Body mass index is 28.08 kg/m.  Physical Exam Vitals signs reviewed.  Constitutional:      Appearance: Normal appearance.  HENT:     Head: Normocephalic and atraumatic.  Eyes:     Extraocular Movements: Extraocular movements intact.     Pupils: Pupils are equal, round, and reactive to light.  Neck:     Musculoskeletal: Normal range of motion and neck supple.  Cardiovascular:     Rate and Rhythm: Normal rate and regular rhythm.     Heart sounds: Normal heart sounds.  Pulmonary:     Effort: Pulmonary effort is normal.     Breath sounds: Normal breath sounds.  Abdominal:     General: Abdomen is flat. Bowel sounds  are normal.     Palpations: Abdomen is soft.  Skin:    General: Skin is warm and dry.  Neurological:     General: No focal deficit present.     Mental Status: She is alert and oriented to person, place, and time.  Psychiatric:        Mood and Affect: Mood normal.        Behavior: Behavior normal.        Thought Content: Thought content normal.        Judgment: Judgment normal.     Comments: No si /hi/ah/vh           Office Visit from 06/10/2019 in [facility]  PHQ-9 Total  Score  16      Assessment/plan: 1. Depression, major, single episode, moderate (HCC) We are going to start her on prozac daily due to high phq9 score, thoughts of being better off dead and impact on her daily life with family. She is slightly reluctant to start medication, but discussed at this point I really do feel like this is what she needs. Also gave her information for counseling and recommended she try to get back into exercising. Any si/hi she is to call 911 or go to ER. I've explained to her that drugs of the SSRI class can have side effects such as weight gain, sexual dysfunction, insomnia, headache, nausea. These medications are generally effective at alleviating symptoms of anxiety and/or depression. Let me know if significant side effects do occur. I will see her back in one month or sooner if needed. Also checking labs to rule out other factors that could be contributing.   This visit occurred during the SARS-CoV-2 public health emergency.  Safety protocols were in place, including screening questions prior to the visit, additional usage of staff PPE, and extensive cleaning of exam room while observing appropriate contact time as indicated for disinfecting solutions.     Return in about 1 month (around 07/11/2019) for depression check up. phq9 please .    Orland Mustard, MD [facility]   06/10/2019

## 2019-06-13 ENCOUNTER — Ambulatory Visit: Payer: Self-pay

## 2019-06-13 NOTE — Telephone Encounter (Signed)
Patient calling back to see lab results Denies any illness or fever.  Patient  States she for got to make appointment.  Will make today.  Reports having headache when she takes the pills.

## 2019-06-13 NOTE — Progress Notes (Signed)
A drop in WBC count is noted.  Patient is clinically doing well.  Please have patient reduce Plaquenil to 1 tablet a day every day.  We will repeat CBC in 2 weeks.

## 2019-06-13 NOTE — Telephone Encounter (Signed)
See below

## 2019-06-13 NOTE — Telephone Encounter (Signed)
[name], See if headache lasts all day and if she can try to take 2 weeks to see if improves, if not let me know and i'll change the  Medication.  Thanks,  Dr. Rogers Blocker

## 2019-06-13 NOTE — Telephone Encounter (Signed)
Please see msg below and advise.  Thanks 

## 2019-06-14 ENCOUNTER — Telehealth: Payer: Self-pay

## 2019-06-14 ENCOUNTER — Other Ambulatory Visit: Payer: Self-pay | Admitting: Family Medicine

## 2019-06-14 ENCOUNTER — Telehealth: Payer: Self-pay | Admitting: Family Medicine

## 2019-06-14 NOTE — Telephone Encounter (Signed)
Copied from [place] [phone]. Topic: General - Other >> Jun 14, 2019  2:17 PM Sheran Luz wrote: Barth Kirks with the [place] requesting call back from [facility] or PCP for clarification for procedure.   [phone]

## 2019-06-14 NOTE — Telephone Encounter (Signed)
Result note from Dr. Rogers Blocker reviewed. Also read note from Dr. Estanislado Pandy.  Pt verbalizes understanding, repeated instructions back. Will call to schedule F/U labs due in 2 weeks.  Offered interpreter service, pt declines.

## 2019-06-14 NOTE — Telephone Encounter (Signed)
Left detailed vm message for patient via Ladon Applebaum ID# [id number] advising of notes per Dr. Rogers Blocker.  Advised patient to contact us back if can not tolerate medication due to headache and Dr. Rogers Blocker would change medication.

## 2019-06-15 NOTE — Telephone Encounter (Signed)
Returned call to [facility] and lm tcb.

## 2019-06-15 NOTE — Telephone Encounter (Signed)
Called and answered questions. Does not need axillae ultrasound.  Stacy Flaming, MD [place]

## 2019-06-22 ENCOUNTER — Other Ambulatory Visit: Payer: Self-pay

## 2019-06-22 DIAGNOSIS — M359 Systemic involvement of connective tissue, unspecified: Secondary | ICD-10-CM

## 2019-06-22 MED ORDER — HYDROXYCHLOROQUINE SULFATE 200 MG PO TABS: ORAL_TABLET | ORAL | 0 refills | Status: DC

## 2019-06-27 ENCOUNTER — Other Ambulatory Visit: Payer: Self-pay | Admitting: Family Medicine

## 2019-06-29 ENCOUNTER — Other Ambulatory Visit: Payer: Self-pay

## 2019-06-29 DIAGNOSIS — Z79899 Other long term (current) drug therapy: Secondary | ICD-10-CM

## 2019-06-29 NOTE — Progress Notes (Signed)
Patient was supposed to repeat CBC in 2 weeks after reducing the dose of Plaquenil.  Please contact patient.

## 2019-07-04 ENCOUNTER — Other Ambulatory Visit: Payer: Self-pay

## 2019-07-04 DIAGNOSIS — Z79899 Other long term (current) drug therapy: Secondary | ICD-10-CM

## 2019-07-04 NOTE — Progress Notes (Signed)
Please remind patient to get the CBC.  She was supposed to repeat it in 2 weeks.

## 2019-07-12 ENCOUNTER — Telehealth: Payer: Self-pay | Admitting: Rheumatology

## 2019-07-12 ENCOUNTER — Other Ambulatory Visit: Payer: Self-pay | Admitting: *Deleted

## 2019-07-12 DIAGNOSIS — Z79899 Other long term (current) drug therapy: Secondary | ICD-10-CM

## 2019-07-12 LAB — CBC WITH DIFFERENTIAL/PLATELET
Absolute Monocytes: 431 cells/uL (ref 200–950)
Basophils Absolute: 49 cells/uL (ref 0–200)
Basophils Relative: 1.4 %
Eosinophils Absolute: 49 cells/uL (ref 15–500)
Eosinophils Relative: 1.4 %
HCT: 34.3 % — ABNORMAL LOW (ref 35.0–45.0)
Hemoglobin: 11.5 g/dL — ABNORMAL LOW (ref 11.7–15.5)
Lymphs Abs: 1474 cells/uL (ref 850–3900)
MCH: 31.2 pg (ref 27.0–33.0)
MCHC: 33.5 g/dL (ref 32.0–36.0)
MCV: 93 fL (ref 80.0–100.0)
MPV: 9.5 fL (ref 7.5–12.5)
Monocytes Relative: 12.3 %
Neutro Abs: 1498 cells/uL — ABNORMAL LOW (ref 1500–7800)
Neutrophils Relative %: 42.8 %
Platelets: 271 10*3/uL (ref 140–400)
RBC: 3.69 10*6/uL — ABNORMAL LOW (ref 3.80–5.10)
RDW: 12.7 % (ref 11.0–15.0)
Total Lymphocyte: 42.1 %
WBC: 3.5 10*3/uL — ABNORMAL LOW (ref 3.8–10.8)

## 2019-07-12 NOTE — Telephone Encounter (Signed)
Released.

## 2019-07-12 NOTE — Telephone Encounter (Signed)
Patient going to [facility] now for labs. Please release orders. °

## 2019-07-13 ENCOUNTER — Other Ambulatory Visit: Payer: Self-pay | Admitting: *Deleted

## 2019-07-13 DIAGNOSIS — M359 Systemic involvement of connective tissue, unspecified: Secondary | ICD-10-CM

## 2019-07-13 MED ORDER — HYDROXYCHLOROQUINE SULFATE 200 MG PO TABS: ORAL_TABLET | ORAL | 0 refills | Status: DC

## 2019-07-13 NOTE — Progress Notes (Signed)
WBC count is better.  Mild anemia noted.  Patient should to stay on Plaquenil 1 tablet p.o. daily.  If she develops any symptoms of disease flare that she should notify us.

## 2019-07-15 NOTE — Progress Notes (Signed)
Her fatigue may be related to underlying anemia.  She should follow up with PCP for further evaluation.  Thank you for forwarding the labs to her PCP.

## 2019-07-20 IMAGING — CR DG CHEST 2V
2 series · 2 of 2 positions shown · non-contrast
Comparison: None.

CLINICAL DATA: Abnormal laboratory test. Neutropenia. STHOKOZO positive.

EXAM:
CHEST - 2 VIEW

[chest pa]
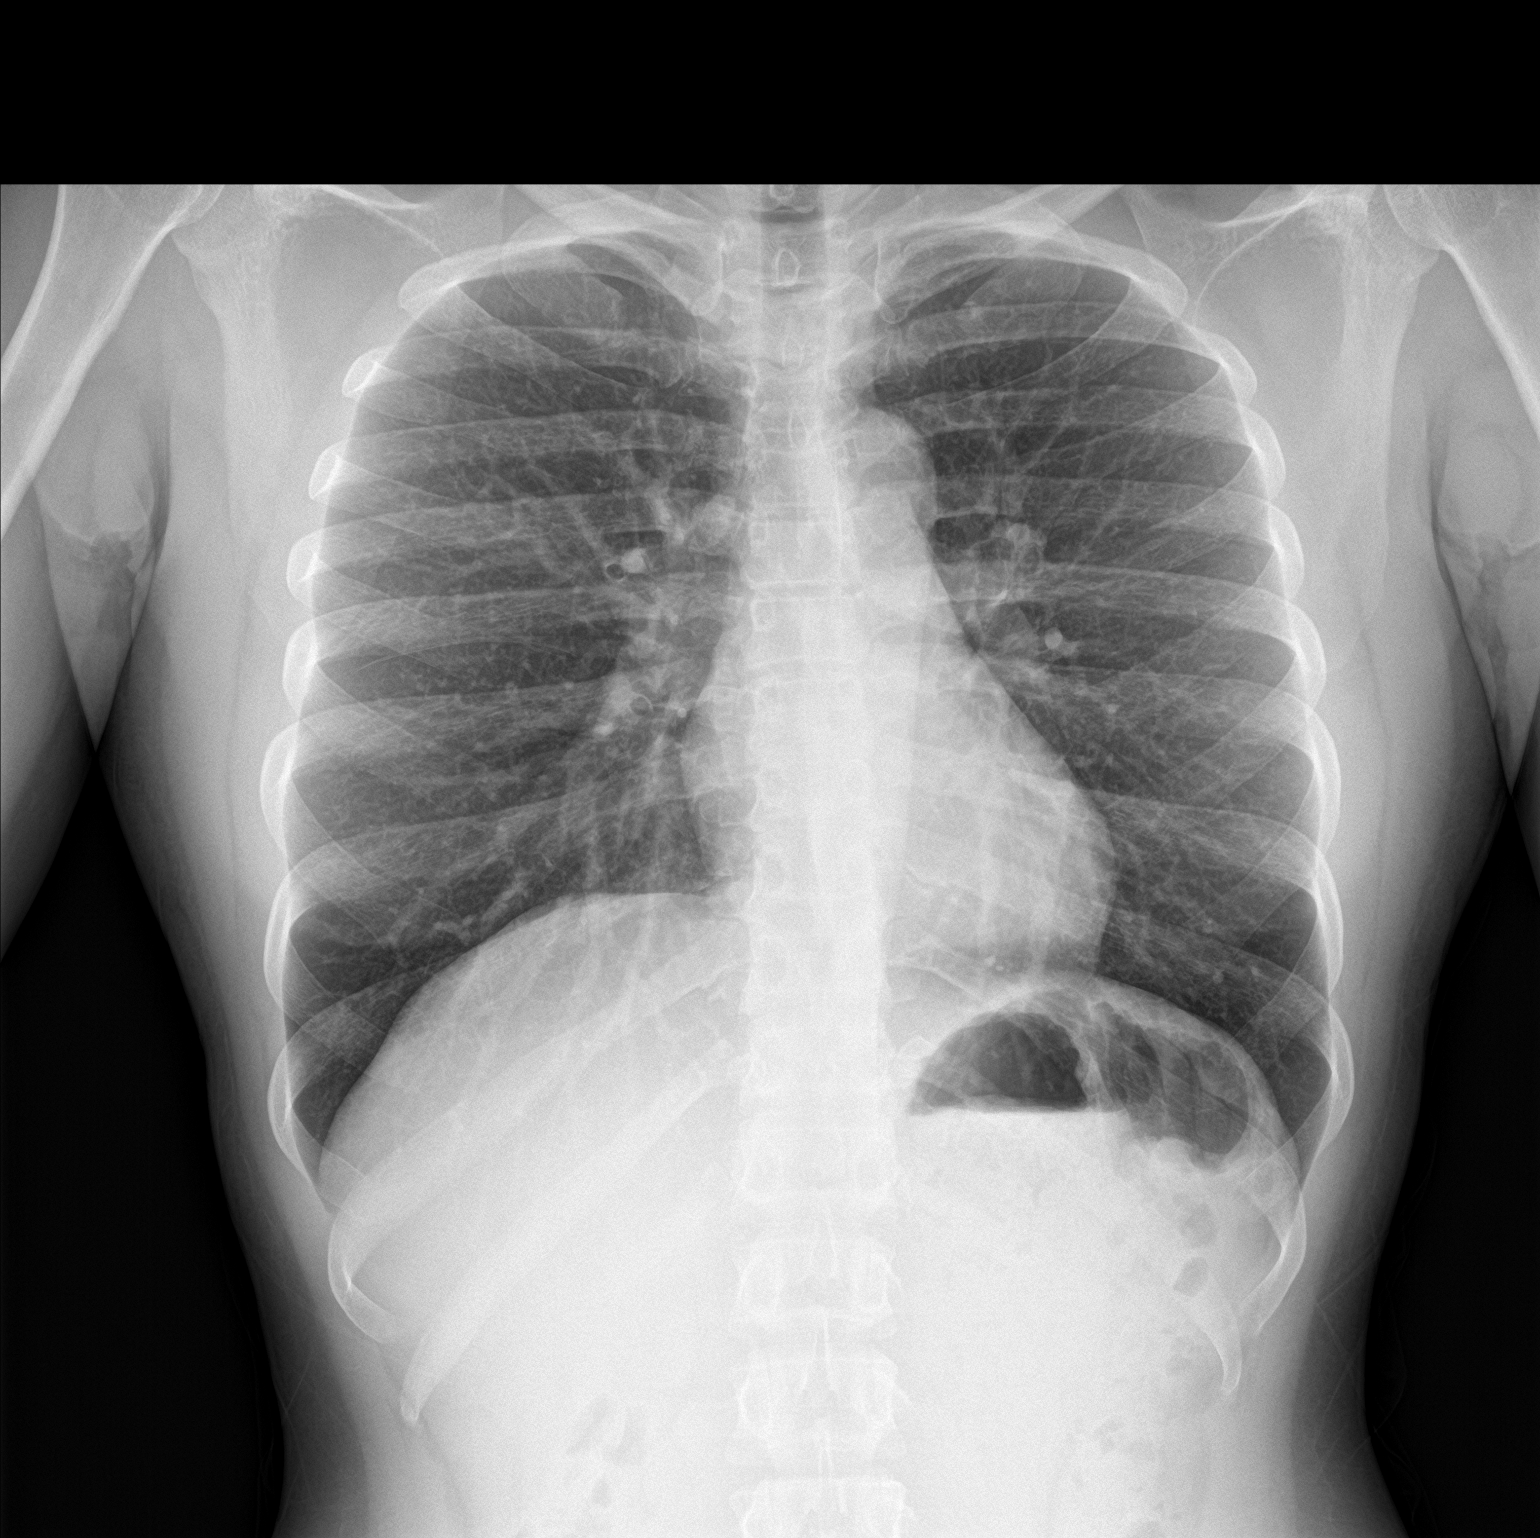

[chest lat]
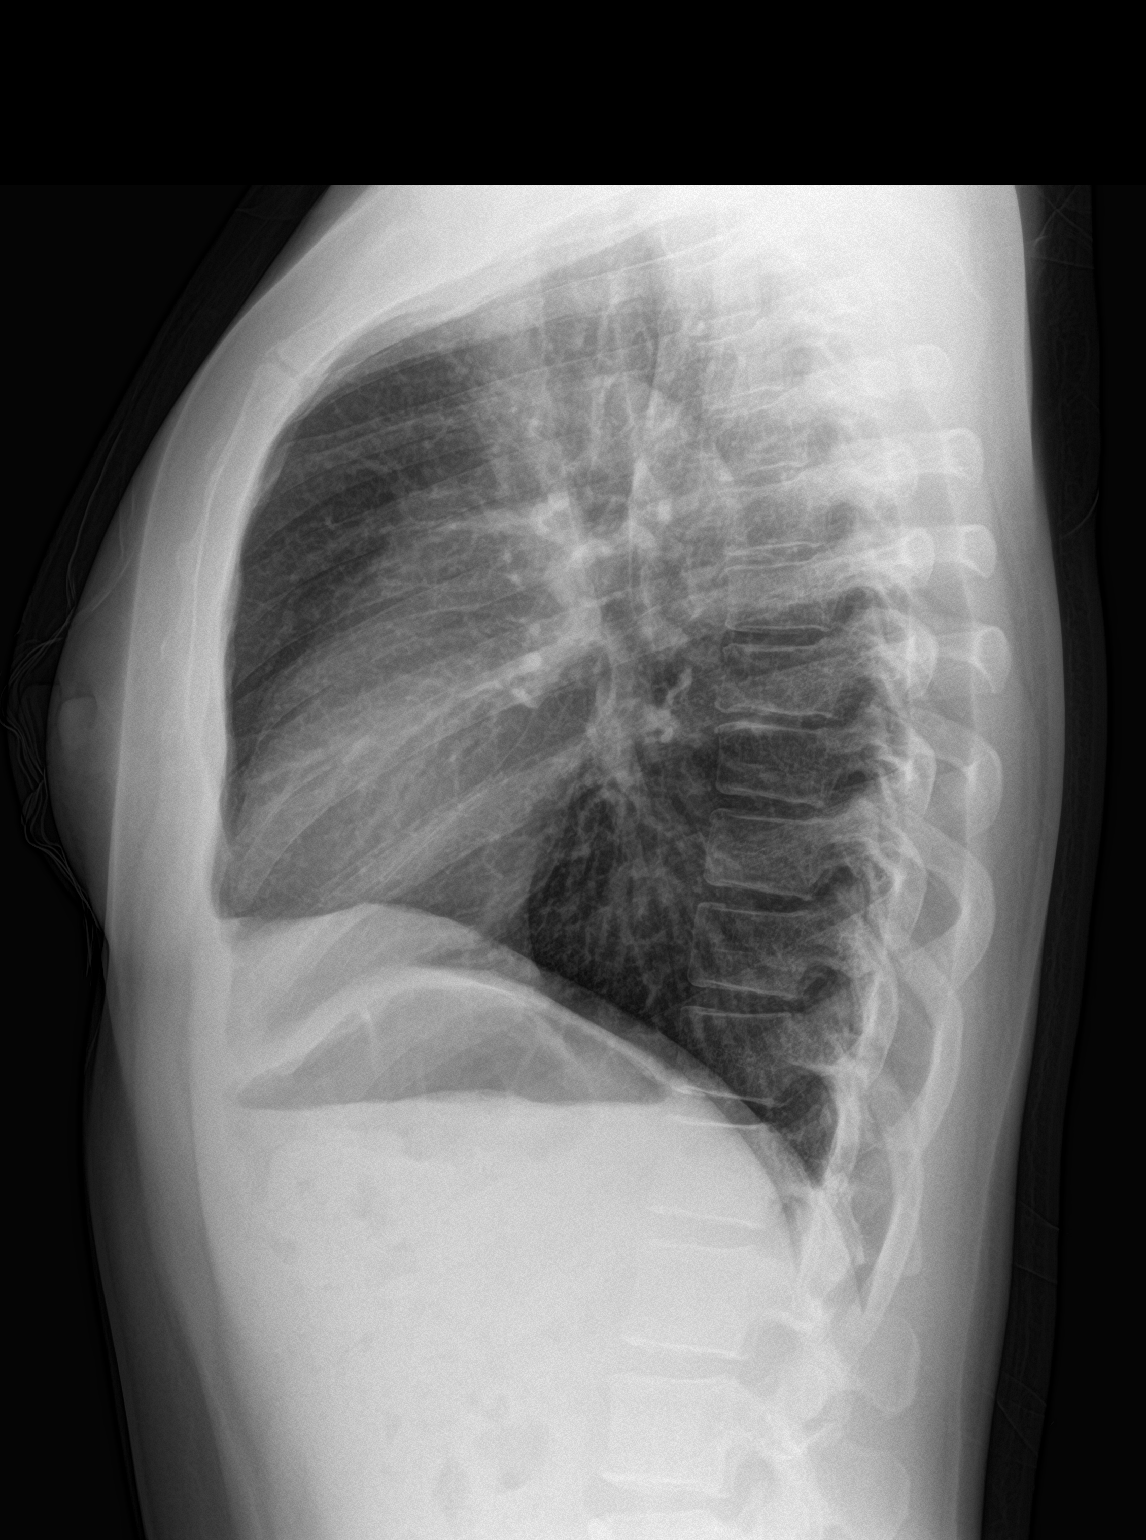

[2 of 2 positions shown; findings below may reference images not displayed]

FINDINGS: The heart size and mediastinal contours are within normal limits.
Both lungs are clear. The visualized skeletal structures are
unremarkable.
IMPRESSION: No active cardiopulmonary disease.

## 2019-07-25 ENCOUNTER — Encounter: Payer: Self-pay | Admitting: Family Medicine

## 2019-07-25 ENCOUNTER — Ambulatory Visit (INDEPENDENT_AMBULATORY_CARE_PROVIDER_SITE_OTHER): Payer: 59 | Admitting: Family Medicine

## 2019-07-25 ENCOUNTER — Other Ambulatory Visit: Payer: Self-pay

## 2019-07-25 VITALS — BP 112/82 | HR 82 | Temp 97.8°F | Ht 61.0 in | Wt 151.0 lb

## 2019-07-25 DIAGNOSIS — F321 Major depressive disorder, single episode, moderate: Secondary | ICD-10-CM | POA: Diagnosis not present

## 2019-07-25 NOTE — Patient Instructions (Signed)
So glad you are doing better! Always good to see you!  6 months or sooner if you need! Dr. Artis Flock

## 2019-07-25 NOTE — Progress Notes (Signed)
Patient: [name] MRN: [id number] DOB: 06/28/1976 PCP: Orland Mustard, MD     Subjective:  Chief Complaint  Patient presents with  . Depression   Translation service used during appointment. In person spanish interpreter.   HPI: The patient is a 44 y.o. female who presents today for follow up on depression. We started her on 20mg  of prozac and is doing much better. She is really starting to feel better. She feels like her outburts and anger are much better. She is starting to have interest in her hobbies. She denies any side effects on medication. Overall is happy with medication. Desires no change in dosage. Denies any si/hi/ah/vh. Still has no sex drive, but not worse than before the medication.   Review of Systems  Constitutional: Positive for fatigue. Negative for chills and fever.  HENT: Negative for dental problem, ear pain, hearing loss and trouble swallowing.   Eyes: Positive for visual disturbance.  Respiratory: Negative for cough, chest tightness and shortness of breath.   Cardiovascular: Negative for chest pain, palpitations and leg swelling.  Gastrointestinal: Negative for abdominal pain, blood in stool and diarrhea.  Endocrine: Negative for cold intolerance, polydipsia, polyphagia and polyuria.  Genitourinary: Negative for dysuria and hematuria.  Musculoskeletal: Negative for arthralgias.  Skin: Negative for rash.  Neurological: Negative for dizziness and headaches.  Psychiatric/Behavioral: Negative for dysphoric mood and sleep disturbance. The patient is not nervous/anxious.     Allergies Patient has No Known Allergies.  Past Medical History Patient  has a past medical history of Abnormal uterine bleeding, History of chicken pox, Urinary tract infection (12/2014), and Vaginal Pap smear, abnormal.  Surgical History Patient  has a past surgical history that includes Cesarean section and LEEP.  Family History Pateint's family history includes Breast  cancer in her paternal grandmother; Diabetes in her mother; Healthy in her brother, brother, sister, son, son, and son; High Cholesterol in her mother; Hypertension in her mother.  Social History Patient  reports that she has been smoking cigarettes. She has never used smokeless tobacco. She reports current alcohol use of about 2.0 standard drinks of alcohol per week. She reports that she does not use drugs.    Objective: Vitals:   07/25/19 0829  BP: 112/82  Pulse: 82  Temp: 97.8 F (36.6 C)  TempSrc: Temporal  SpO2: 98%  Weight: 151 lb (68.5 kg)  Height: 5\' 1"  (1.549 m)    Body mass index is 28.53 kg/m.  Physical Exam Depression screen [facility] 2/9 07/25/2019 06/10/2019 04/08/2019  Decreased Interest 1 0 0  Down, Depressed, Hopeless 1 3 0  PHQ - 2 Score 2 3 0  Altered sleeping - 3 -  Tired, decreased energy 1 3 -  Change in appetite 1 0 -  Feeling bad or failure about yourself  0 2 -  Trouble concentrating 1 2 -  Moving slowly or fidgety/restless 1 2 -  Suicidal thoughts 0 1 -  PHQ-9 Score - 16 -  Difficult doing work/chores Not difficult at all Somewhat difficult -       Assessment/plan: 1. Depression, major, single episode, moderate (HCC) phq-9 score with significant improvement down to 6. Doing much better on current dosage of prozac. Will continue her at lower dose since symptoms have improved and controlled. F/u in 6 months or sooner if needed. No refills at this time. Will be due in march.     This visit occurred during the SARS-CoV-2 public health emergency.  Safety protocols were in place, including screening  questions prior to the visit, additional usage of staff PPE, and extensive cleaning of exam room while observing appropriate contact time as indicated for disinfecting solutions.     Return in about 6 months (around 01/22/2020) for depression routine follow up .   Orland Mustard, MD [place] [facility]   07/25/2019

## 2019-08-11 ENCOUNTER — Other Ambulatory Visit: Payer: Self-pay | Admitting: Rheumatology

## 2019-08-11 DIAGNOSIS — M359 Systemic involvement of connective tissue, unspecified: Secondary | ICD-10-CM

## 2019-08-11 NOTE — Telephone Encounter (Signed)
Ok to refill 30 day supply of PLQ.

## 2019-08-11 NOTE — Telephone Encounter (Signed)
Last Visit: 04/14/2019  Next Visit: 09/15/2019 Labs: 07/12/2019 WBC count is better. Mild anemia noted. Patient should to stay on Plaquenil 1 tablet p.o. daily Eye exam: 03/31/2018  Attempted to contact patient and left message on machine to advise patient we need an updated PLQ eye exam.   Okay to refill 30 day supply of PLQ?

## 2019-08-26 ENCOUNTER — Other Ambulatory Visit: Payer: Self-pay | Admitting: Family Medicine

## 2019-09-05 ENCOUNTER — Other Ambulatory Visit: Payer: Self-pay

## 2019-09-05 ENCOUNTER — Encounter: Payer: Self-pay | Admitting: Family Medicine

## 2019-09-05 ENCOUNTER — Other Ambulatory Visit: Payer: Self-pay | Admitting: Family Medicine

## 2019-09-05 ENCOUNTER — Ambulatory Visit (INDEPENDENT_AMBULATORY_CARE_PROVIDER_SITE_OTHER): Payer: 59 | Admitting: Family Medicine

## 2019-09-05 VITALS — BP 110/64 | HR 62 | Temp 98.0°F | Ht 61.0 in | Wt 151.6 lb

## 2019-09-05 DIAGNOSIS — B351 Tinea unguium: Secondary | ICD-10-CM

## 2019-09-05 DIAGNOSIS — Z5181 Encounter for therapeutic drug level monitoring: Secondary | ICD-10-CM | POA: Diagnosis not present

## 2019-09-05 LAB — HEPATIC FUNCTION PANEL
ALT: 14 U/L (ref 0–35)
AST: 19 U/L (ref 0–37)
Albumin: 4.3 g/dL (ref 3.5–5.2)
Alkaline Phosphatase: 64 U/L (ref 39–117)
Bilirubin, Direct: 0.1 mg/dL (ref 0.0–0.3)
Total Bilirubin: 0.4 mg/dL (ref 0.2–1.2)
Total Protein: 7.5 g/dL (ref 6.0–8.3)

## 2019-09-05 MED ORDER — TERBINAFINE HCL 250 MG PO TABS: 250.0000 mg | ORAL_TABLET | Freq: Every day | ORAL | 0 refills | Status: DC

## 2019-09-05 MED ORDER — GABAPENTIN 300 MG PO CAPS: ORAL_CAPSULE | ORAL | 0 refills | Status: DC

## 2019-09-05 NOTE — Progress Notes (Signed)
Patient: [name] MRN: [id number] DOB: Sep 29, 1975 PCP: Orland Mustard, MD     Subjective:  Chief Complaint  Patient presents with  . Medication Problem    Pt says that she is taking 3 times daily, and would like to decrease to 1 time daily.  . Nail Problem    Left leg, Big Toe; Pt complains of fungus, and says that it hurts when breaks in the middle.    HPI: The patient is a 44 y.o. female who presents today for nail problem. Had a nail fungus 10 years ago on same toe. Her main complaint is the big left toe. She denies any trauma to the toe. No showering in community showers,does get her toes done at a nail salon. She states her nail is brittle and breaking. It also looks discolored. She has darkened bands as well that she thinks is new in this toe, has in other toes.   She also has some questions regarding her gabapentin/dosage.   Gets dizzy at times and was told her BP was low by her dentist. Does not have cuff at home.    Review of Systems  Constitutional: Negative for chills, fatigue and fever.  HENT: Positive for sinus pressure. Negative for sinus pain and sore throat.   Respiratory: Negative for cough, shortness of breath and wheezing.   Cardiovascular: Negative for chest pain, palpitations and leg swelling.  Gastrointestinal: Negative for nausea and vomiting.  Skin:       Left toenail issue per hpi   Neurological: Negative for dizziness, light-headedness and headaches.    Allergies Patient has No Known Allergies.  Past Medical History Patient  has a past medical history of Abnormal uterine bleeding, History of chicken pox, Urinary tract infection (12/2014), and Vaginal Pap smear, abnormal.  Surgical History Patient  has a past surgical history that includes Cesarean section and LEEP.  Family History Pateint's family history includes Breast cancer in her paternal grandmother; Diabetes in her mother; Healthy in her brother, brother, sister, son, son,  and son; High Cholesterol in her mother; Hypertension in her mother.  Social History Patient  reports that she has been smoking cigarettes. She has never used smokeless tobacco. She reports current alcohol use of about 2.0 standard drinks of alcohol per week. She reports that she does not use drugs.    Objective: Vitals:   09/05/19 1424  BP: 110/64  Pulse: 62  Temp: 98 F (36.7 C)  TempSrc: Temporal  SpO2: 94%  Weight: 151 lb 9.6 oz (68.8 kg)  Height: 5\' 1"  (1.549 m)    Body mass index is 28.64 kg/m.  Physical Exam Vitals reviewed.  Constitutional:      Appearance: Normal appearance. She is normal weight.  HENT:     Head: Normocephalic and atraumatic.  Pulmonary:     Effort: Pulmonary effort is normal.  Skin:    Comments: Left toenail: thickened with some slight yellow discoloration at end of toenail. Brittle with cracks in nail. melanonychia at lateral and medial aspect of toenail. Negative hutchinsons sign.   Neurological:     General: No focal deficit present.     Mental Status: She is alert and oriented to person, place, and time.  Psychiatric:        Behavior: Behavior normal.        Assessment/plan: 1. Nail fungus Terbinafine x 12 weeks. Liver enzymes today then in 6 weeks. 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   2. Therapeutic drug monitoring  - Hepatic function panel  3) gabapentin Discussed she can take 3x/day and increase pm dosage to 600mg . Do not take 4x/day.   This visit occurred during the SARS-CoV-2 public health emergency.  Safety protocols were in place, including screening questions prior to the visit, additional usage of staff PPE, and extensive cleaning of exam room while observing appropriate contact time as indicated for disinfecting solutions.     Return if symptoms worsen  or fail to improve.   Orland Mustard, MD [place] [facility]   09/05/2019

## 2019-09-05 NOTE — Patient Instructions (Signed)
Medication is once/day for 12 weeks. Checking liver function today, at 6 weeks then will refill your medication.     Preventing Toenail Fungus from Recurring   Sanitize your shoes with Mycomist spray or a similar shoe sanitizer spray.  Follow the instructions on the bottle and dry them outside in the sun or with a hairdryer.  We also recommend repeating the sanitization once weekly in shoes you wear most often.   Throw away any shoes you have worn a significant amount without socks-fungus thrives in a warm moist environment and you want to avoid re-infection after your laser procedure   Bleach your socks with regular or color safe bleach   Change your socks regularly to keep your feet clean and dry (especially if you have sweaty feet)-if sweaty feet are a problem, let your doctor know-there is a great lotion that helps with this problem.   Clean your toenail clippers with alcohol before you use them if you do your own toenails and make sure to replace Emory boards and orange sticks regularly   If you get regular pedicures, bring your own instruments or go to a spa that sterilizes their instruments in an autoclave.

## 2019-09-06 ENCOUNTER — Other Ambulatory Visit: Payer: Self-pay | Admitting: Family Medicine

## 2019-09-06 DIAGNOSIS — Z1231 Encounter for screening mammogram for malignant neoplasm of breast: Secondary | ICD-10-CM

## 2019-09-08 ENCOUNTER — Other Ambulatory Visit: Payer: Self-pay | Admitting: *Deleted

## 2019-09-08 DIAGNOSIS — M359 Systemic involvement of connective tissue, unspecified: Secondary | ICD-10-CM

## 2019-09-08 MED ORDER — HYDROXYCHLOROQUINE SULFATE 200 MG PO TABS: 200.0000 mg | ORAL_TABLET | Freq: Every day | ORAL | 0 refills | Status: DC

## 2019-09-08 NOTE — Telephone Encounter (Signed)
Refill request received via fax  Last Visit: 04/14/2019  Next Visit: 09/15/2019 Labs: 07/12/2019 WBC count is better. Mild anemia noted. Patient should to stay on Plaquenil 1 tablet p.o. daily Eye exam: 03/31/2018  Spoke with patient to advise she is due to update her PLQ eye exam. Patient states she has updated Feb. 2021 and has the documentation. Patient will bring to the office.  Okay to refill Plaquenil?

## 2019-09-08 NOTE — Telephone Encounter (Signed)
Results received for PLQ eye exam  06/15/19 WNL

## 2019-09-08 NOTE — Telephone Encounter (Signed)
Please call ophthalmology office to obtain records of the PLQ eye exam today prior to sending in refill.

## 2019-09-09 NOTE — Progress Notes (Signed)
Office Visit Note  Patient: [name]             Date of Birth: [birth year]           MRN: [id number]             PCP: Orland Mustard, MD Referring: Orland Mustard, MD Visit Date: 09/15/2019 Occupation: @GUAROCC @   Interpreter: Marta Col  Subjective:  Neck pain    History of Present Illness: [name] is a 44 y.o. female with history of autoimmune disease and osteoarthritis.  She is taking plaquenil 200 mg 1 tablet by mouth once daily.  She reduce the dose of Plaquenil in December 2020 due to low white blood cell count.  She says she has been having increased arthralgias and joint stiffness since reducing the dose of Plaquenil.  She presents today with pain in both hands and the left knee joint.  She has intermittent swelling in the left knee.  She is also having increased discomfort in her C-spine and has had limited range of motion as well as left-sided radiculopathy.  She states that her neck pain started about 3 months ago and denies any injury prior to the onset of symptoms. She continues to have chronic sicca symptoms.  She has not had any sores in her mouth or nose recently.  She has not had any recent rashes.  She denies any symptoms of Raynaud's.  She has not had any enlarged lymph nodes recently.    Activities of Daily Living:  Patient reports morning stiffness for 1-2 minutes.   Patient Reports nocturnal pain.  Difficulty dressing/grooming: Denies Difficulty climbing stairs: Reports Difficulty getting out of chair: Denies Difficulty using hands for taps, buttons, cutlery, and/or writing: Reports  Review of Systems  Constitutional: Positive for fatigue.  HENT: Positive for mouth sores. Negative for mouth dryness and nose dryness.   Eyes: Positive for itching. Negative for pain, visual disturbance and dryness.  Respiratory: Negative for cough, hemoptysis, shortness of breath and difficulty breathing.   Cardiovascular: Negative for chest pain,  palpitations, hypertension, irregular heartbeat and swelling in legs/feet.  Gastrointestinal: Positive for constipation. Negative for blood in stool and diarrhea.  Endocrine: Negative for increased urination.  Genitourinary: Positive for painful urination.  Musculoskeletal: Positive for arthralgias, joint pain, joint swelling and morning stiffness. Negative for myalgias, muscle weakness, muscle tenderness and myalgias.  Skin: Negative for color change, pallor, rash, hair loss, nodules/bumps, skin tightness, ulcers and sensitivity to sunlight.  Allergic/Immunologic: Negative for susceptible to infections.  Neurological: Positive for headaches. Negative for dizziness, numbness, memory loss and weakness.  Hematological: Negative for swollen glands.  Psychiatric/Behavioral: Negative for depressed mood, confusion and sleep disturbance. The patient is not nervous/anxious.     PMFS History:  Patient Active Problem List   Diagnosis Date Noted  . Depression, major, single episode, moderate (HCC) 07/25/2019  . Autoimmune disorder (HCC) 03/25/2018  . Neutropenia (HCC) 01/04/2018  . ANA positive 01/04/2018  . 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 Abnormal uterine bleeding   . History of chicken pox   . Urinary tract infection 12/2014  . Vaginal Pap smear, abnormal     Family History  Problem Relation Age of Onset  . Diabetes Mother   . Hypertension Mother   . High Cholesterol Mother   . Breast cancer Paternal Grandmother   . Healthy Sister   . Healthy Brother   . Healthy Brother   .  Healthy Son   . Healthy Son   . Healthy Son    Past Surgical History:  Procedure Laterality Date  . CESAREAN SECTION    . LEEP     Social History   Social History Narrative  . Not on file   Immunization History  Administered Date(s) Administered  . Influenza,inj,Quad PF,6+ Mos 06/24/2018, 04/08/2019  . Pneumococcal Polysaccharide-23 03/25/2018   . Tdap 11/23/2014     Objective: Vital Signs: BP 125/75 (BP Location: Right Arm, Patient Position: Sitting, Cuff Size: Normal)   Pulse 84   Resp 13   Ht 5\' 1"  (1.549 m)   Wt 151 lb 12.8 oz (68.9 kg)   BMI 28.68 kg/m    Physical Exam Vitals and nursing note reviewed.  Constitutional:      Appearance: She is well-developed.  HENT:     Head: Normocephalic and atraumatic.  Eyes:     Conjunctiva/sclera: Conjunctivae normal.  Pulmonary:     Effort: Pulmonary effort is normal.  Abdominal:     General: Bowel sounds are normal.     Palpations: Abdomen is soft.  Musculoskeletal:     Cervical back: Normal range of motion.  Lymphadenopathy:     Cervical: No cervical adenopathy.  Skin:    General: Skin is warm and dry.     Capillary Refill: Capillary refill takes less than 2 seconds.  Neurological:     Mental Status: She is alert and oriented to person, place, and time.  Psychiatric:        Behavior: Behavior normal.      Musculoskeletal Exam: C-spine limited ROM to the left with discomfort.  Midline tenderness along the C-spine.  Left trapezius muscle tension and tenderness. Thoracic and lumbar spine good ROM. No midline spinal tenderness.  No SI joint tenderness. Shoulder joints, elbow joints, wrist joints, MCPs, PIPs, and DIPs good ROM with no synovitis.  Complete fist formation bilaterally.  Hip joints, knee joints, ankle joints, MTPs, PIPs, and DIPs good ROM with no synovitis.  No warmth or effusion of knee joints noted.  No tenderness or inflammation of ankle joints.   CDAI Exam: CDAI Score: -- Patient Global: --; Provider Global: -- Swollen: --; Tender: -- Joint Exam 09/15/2019   No joint exam has been documented for this visit   There is currently no information documented on the homunculus. Go to the Rheumatology activity and complete the homunculus joint exam.  Investigation: No additional findings.  Imaging: XR Cervical Spine 2 or 3 views  Result Date:  09/15/2019 No significant disc space narrowing was noted.  No facet joint arthropathy was noted. Impression: Unremarkable x-ray of the cervical spine.   Recent Labs: Lab Results  Component Value Date   WBC 3.5 (L) 07/12/2019   HGB 11.5 (L) 07/12/2019   PLT 271 07/12/2019   NA 138 06/10/2019   K 4.3 06/10/2019   CL 103 06/10/2019   CO2 26 06/10/2019   GLUCOSE 104 (H) 06/10/2019   BUN 9 06/10/2019   CREATININE 0.83 06/10/2019   BILITOT 0.4 09/05/2019   ALKPHOS 64 09/05/2019   AST 19 09/05/2019   ALT 14 09/05/2019   PROT 7.5 09/05/2019   ALBUMIN 4.3 09/05/2019   CALCIUM 9.5 06/10/2019   GFRAA 123 04/14/2019    Speciality Comments: PLQ Eye Exam: 06/15/19 @ [facility] Follow up in 1 year  Procedures:  No procedures performed Allergies: Patient has no known allergies.   Assessment / Plan:     Visit Diagnoses: Autoimmune  disease (HCC) - Positive ANA 1: 5120 nucleolar, positive SSA a, neutropenia, photosensitivity, oral ulcers and arthritis: She has not had any signs or symptoms of a flare recently. She is clinically doing well on Plaquenil 200 mg 1 tablet daily.  She reduced the dose of PLQ in December 2020 due to low WBC count-2.2.  She has a history of neutropenia.  We will recheck CBC today.  Complements WNL, dsDNA 1, and ESR was 6 on 04/14/19.  She has been experiencing increased arthralgias and joint stiffness since reducing the dose of PLQ.  No synovitis was noted on exam.  She has not had any recent rashes, photosensitivity, or hair loss recently.  She denies any symptoms of Raynaud's.  She will continue taking plaquenil 200 mg 1 tablet by mouth daily.  We will check autoimmune lab work today.  She was advised to notify us if she develops any new or worsening symptoms.  She will follow up in 5 months.  Plan: Anti-DNA antibody, double-stranded, C3 and C4, Urinalysis, Routine w reflex microscopic, COMPLETE METABOLIC PANEL WITH GFR, CBC with Differential/Platelet,  Sedimentation rate  High risk medication use - Plaquenil 200 mg 1 tablet by mouth daily. She is on the reduced dose of PLQ due to history of neutropenia. Eye Exam: 06/15/19.  CBC and CMP were drawn today to monitor for drug toxicity. - Plan: COMPLETE METABOLIC PANEL WITH GFR, CBC with Differential/Platelet  Primary osteoarthritis of both knees: She has good ROM of both knee joints.  No warmth or effusion noted on exam today. She has been experiencing increased left knee joint pain recently.  She is not having any mechanical symptoms at this time.   She is taking meloxicam 15 mg 1 tablet daily for pain relief.  Primary osteoarthritis of both feet: She is not having any discomfort in her feet at this time.   Other fatigue: She continues to have chronic fatigue secondary to insomnia.    History of neutropenia - CBC w/ diff will be drawn today. Plan: CBC with Differential/Platelet  Neck pain -She presents today with neck pain, which started 3 months ago. She denies any injury or change in activity prior to the onset of symptoms.  She has been experiencing nocturnal pain and left sided radiculopathy.  Her left trapezius has been having muscle spasms intermittently.  She has limited ROM of the C-spine with lateral rotation to the left.  Left trapezius muscle tension and tenderness.  X-rays of the C-spine were obtained today which were unremarkable. She was given a prescription for zanflex 4 mg po at bedtime as needed for muscle spasms.  She was advised to notify us if she develops any new or worsening symptoms. Plan: XR Cervical Spine 2 or 3 views  Orders: Orders Placed This Encounter  Procedures  . XR Cervical Spine 2 or 3 views  . Anti-DNA antibody, double-stranded  . C3 and C4  . Urinalysis, Routine w reflex microscopic  . COMPLETE METABOLIC PANEL WITH GFR  . CBC with Differential/Platelet  . Sedimentation rate   Meds ordered this encounter  Medications  . tiZANidine (ZANAFLEX) 4 MG tablet     Sig: Take 1 tablet (4 mg total) by mouth at bedtime as needed for muscle spasms.    Dispense:  10 tablet    Refill:  0    Face-to-face time spent with patient was 30 minutes. Greater than 50% of time was spent in counseling and coordination of care.  Follow-Up Instructions: Return in about 5  months (around 02/15/2020) for Autoimmune Disease, Osteoarthritis.   Gearldine Bienenstock, PA-C  Note - This record has been created using Dragon software.  Chart creation errors have been sought, but may not always  have been located. Such creation errors do not reflect on  the standard of medical care.

## 2019-09-15 ENCOUNTER — Ambulatory Visit (INDEPENDENT_AMBULATORY_CARE_PROVIDER_SITE_OTHER): Payer: 59 | Admitting: Physician Assistant

## 2019-09-15 ENCOUNTER — Other Ambulatory Visit: Payer: Self-pay

## 2019-09-15 ENCOUNTER — Ambulatory Visit: Payer: Self-pay

## 2019-09-15 ENCOUNTER — Encounter: Payer: Self-pay | Admitting: Rheumatology

## 2019-09-15 VITALS — BP 125/75 | HR 84 | Resp 13 | Ht 61.0 in | Wt 151.8 lb

## 2019-09-15 DIAGNOSIS — M19071 Primary osteoarthritis, right ankle and foot: Secondary | ICD-10-CM

## 2019-09-15 DIAGNOSIS — M542 Cervicalgia: Secondary | ICD-10-CM | POA: Diagnosis not present

## 2019-09-15 DIAGNOSIS — Z79899 Other long term (current) drug therapy: Secondary | ICD-10-CM

## 2019-09-15 DIAGNOSIS — M19072 Primary osteoarthritis, left ankle and foot: Secondary | ICD-10-CM

## 2019-09-15 DIAGNOSIS — M359 Systemic involvement of connective tissue, unspecified: Secondary | ICD-10-CM

## 2019-09-15 DIAGNOSIS — M170 Bilateral primary osteoarthritis of knee: Secondary | ICD-10-CM | POA: Diagnosis not present

## 2019-09-15 DIAGNOSIS — Z862 Personal history of diseases of the blood and blood-forming organs and certain disorders involving the immune mechanism: Secondary | ICD-10-CM

## 2019-09-15 DIAGNOSIS — R5383 Other fatigue: Secondary | ICD-10-CM

## 2019-09-15 MED ORDER — TIZANIDINE HCL 4 MG PO TABS: 4.0000 mg | ORAL_TABLET | Freq: Every evening | ORAL | 0 refills | Status: DC | PRN

## 2019-09-16 LAB — COMPLETE METABOLIC PANEL WITH GFR
AG Ratio: 1.5 (calc) (ref 1.0–2.5)
ALT: 18 U/L (ref 6–29)
AST: 23 U/L (ref 10–30)
Albumin: 4.4 g/dL (ref 3.6–5.1)
Alkaline phosphatase (APISO): 76 U/L (ref 31–125)
BUN: 16 mg/dL (ref 7–25)
CO2: 28 mmol/L (ref 20–32)
Calcium: 10 mg/dL (ref 8.6–10.2)
Chloride: 103 mmol/L (ref 98–110)
Creat: 0.76 mg/dL (ref 0.50–1.10)
GFR, Est African American: 113 mL/min/{1.73_m2} (ref >=60)
GFR, Est Non African American: 97 mL/min/{1.73_m2} (ref >=60)
Globulin: 3 g/dL (calc) (ref 1.9–3.7)
Glucose, Bld: 100 mg/dL — ABNORMAL HIGH (ref 65–99)
Potassium: 4.1 mmol/L (ref 3.5–5.3)
Sodium: 137 mmol/L (ref 135–146)
Total Bilirubin: 0.3 mg/dL (ref 0.2–1.2)
Total Protein: 7.4 g/dL (ref 6.1–8.1)

## 2019-09-16 LAB — CBC WITH DIFFERENTIAL/PLATELET
Absolute Monocytes: 330 cells/uL (ref 200–950)
Basophils Absolute: 71 cells/uL (ref 0–200)
Basophils Relative: 2.1 %
Eosinophils Absolute: 61 cells/uL (ref 15–500)
Eosinophils Relative: 1.8 %
HCT: 38.4 % (ref 35.0–45.0)
Hemoglobin: 12.5 g/dL (ref 11.7–15.5)
Lymphs Abs: 1333 cells/uL (ref 850–3900)
MCH: 30.9 pg (ref 27.0–33.0)
MCHC: 32.6 g/dL (ref 32.0–36.0)
MCV: 94.8 fL (ref 80.0–100.0)
MPV: 9.4 fL (ref 7.5–12.5)
Monocytes Relative: 9.7 %
Neutro Abs: 1605 cells/uL (ref 1500–7800)
Neutrophils Relative %: 47.2 %
Platelets: 258 10*3/uL (ref 140–400)
RBC: 4.05 10*6/uL (ref 3.80–5.10)
RDW: 12.7 % (ref 11.0–15.0)
Total Lymphocyte: 39.2 %
WBC: 3.4 10*3/uL — ABNORMAL LOW (ref 3.8–10.8)

## 2019-09-16 LAB — URINALYSIS, ROUTINE W REFLEX MICROSCOPIC
Bilirubin Urine: NEGATIVE
Glucose, UA: NEGATIVE
Hgb urine dipstick: NEGATIVE
Ketones, ur: NEGATIVE
Leukocytes,Ua: NEGATIVE
Nitrite: NEGATIVE
Protein, ur: NEGATIVE
Specific Gravity, Urine: 1.017 (ref 1.001–1.03)
pH: <=5 (ref 5.0–8.0)

## 2019-09-16 LAB — ANTI-DNA ANTIBODY, DOUBLE-STRANDED: ds DNA Ab: 1 IU/mL

## 2019-09-16 LAB — C3 AND C4
C3 Complement: 80 mg/dL — ABNORMAL LOW (ref 83–193)
C4 Complement: 20 mg/dL (ref 15–57)

## 2019-09-16 LAB — SEDIMENTATION RATE: Sed Rate: 6 mm/h (ref 0–20)

## 2019-09-16 NOTE — Progress Notes (Signed)
DsDNA is 1.  Since her dsDNA is negative and ESR are WNL it is unlikely she is having a flare.  We still recommend repeating lab work in 3 months.

## 2019-09-16 NOTE — Progress Notes (Signed)
Reviewed labs with Dr. Estanislado Pandy.  UA normal. ESR WNL.  C3 is borderline low and C4 is WNL. WBC count is mildly low but stable.  CMP WNL.   Please advise patient to continue taking plaquenil 200 mg 1 tablet daily.  Please advise patient to return for lab work in  16month.

## 2019-09-22 ENCOUNTER — Other Ambulatory Visit: Payer: Self-pay | Admitting: *Deleted

## 2019-09-22 NOTE — Telephone Encounter (Signed)
This was a one time prescription for zanaflex. We will not be refilling zanaflex in the future.

## 2019-09-22 NOTE — Telephone Encounter (Signed)
Refill request received via fax  Last Visit: 09/15/19 Next Visit: 02/22/20  Okay to refill Tizanidine?

## 2019-09-28 ENCOUNTER — Telehealth: Payer: Self-pay | Admitting: Family Medicine

## 2019-09-28 NOTE — Telephone Encounter (Signed)
Yes, we can change her to cymbalta but she is going ot have to wean off of the prozac first. Is she wanting to do this?  Dr.Callie Facey

## 2019-09-28 NOTE — Telephone Encounter (Signed)
Tried calling the patient to see if the Prozac was still working and/or why she wanted to switch. Voicemail box was full, I could not leave a message.  Please Advise.

## 2019-09-28 NOTE — Telephone Encounter (Signed)
Patient called in asking if she could switch to cymbalta, states her and wolfe discussed switching when patient was ready.

## 2019-09-29 ENCOUNTER — Other Ambulatory Visit: Payer: Self-pay | Admitting: Family Medicine

## 2019-09-29 MED ORDER — DULOXETINE HCL 30 MG PO CPEP: 30.0000 mg | ORAL_CAPSULE | Freq: Every day | ORAL | 0 refills | Status: DC

## 2019-09-29 NOTE — Telephone Encounter (Signed)
Attempted to lvm, but vm is full.

## 2019-09-29 NOTE — Telephone Encounter (Signed)
Since she is on such a low dose of prozac she can just stop this and start the cymbalta the next day. It's 30mg . F/u with me in 1 month.  , MD [facility]

## 2019-09-29 NOTE — Telephone Encounter (Signed)
Patient called back.  States she is ok with weaning off of the cymbalta.  Would like a call back with what that process would look like.

## 2019-09-30 NOTE — Telephone Encounter (Signed)
Returned Pt's call. Could not leave vm.

## 2019-09-30 NOTE — Telephone Encounter (Signed)
Patient returning missed call. 

## 2019-10-05 ENCOUNTER — Other Ambulatory Visit: Payer: Self-pay

## 2019-10-05 ENCOUNTER — Ambulatory Visit
Admission: RE | Admit: 2019-10-05 | Discharge: 2019-10-05 | Disposition: A | Discharge status: Home / Self Care | Payer: 59 | Source: Ambulatory Visit | Attending: Family Medicine | Admitting: Family Medicine

## 2019-10-05 DIAGNOSIS — Z1231 Encounter for screening mammogram for malignant neoplasm of breast: Secondary | ICD-10-CM

## 2019-10-12 NOTE — Telephone Encounter (Signed)
Lvm for patient to return call to the office.

## 2019-10-12 NOTE — Telephone Encounter (Signed)
Pleae try and call her again.  Thanks,  [facility]

## 2019-11-03 ENCOUNTER — Encounter: Payer: Self-pay | Admitting: Orthopaedic Surgery

## 2019-11-03 ENCOUNTER — Other Ambulatory Visit: Payer: Self-pay

## 2019-11-03 ENCOUNTER — Ambulatory Visit (INDEPENDENT_AMBULATORY_CARE_PROVIDER_SITE_OTHER): Payer: 59 | Admitting: Orthopaedic Surgery

## 2019-11-03 ENCOUNTER — Ambulatory Visit (INDEPENDENT_AMBULATORY_CARE_PROVIDER_SITE_OTHER): Payer: 59

## 2019-11-03 VITALS — Ht 61.0 in | Wt 150.0 lb

## 2019-11-03 DIAGNOSIS — M25562 Pain in left knee: Secondary | ICD-10-CM | POA: Diagnosis not present

## 2019-11-03 DIAGNOSIS — G8929 Other chronic pain: Secondary | ICD-10-CM | POA: Diagnosis not present

## 2019-11-03 NOTE — Progress Notes (Signed)
Office Visit Note   Patient: [name]           Date of Birth: [birth year]           MRN: [id number] Visit Date: 11/03/2019              Requested by: Orland Mustard, MD [address] [place],  [place] [address] PCP: Orland Mustard, MD   Assessment & Plan: Visit Diagnoses:  1. Chronic pain of left knee     Plan: Impression is chronic left knee pain with temporary relief from cortisone injection.  At this point we will need to obtain MRI to evaluate for structural normalities and to fully characterize the mass in her popliteal fossa.  Follow-up after the MRI.  Today's encounter was may complex due to language barrier.  Follow-Up Instructions: Return in about 2 weeks (around 11/17/2019).   Orders:  Orders Placed This Encounter  Procedures  . XR KNEE 3 VIEW LEFT  . MR Knee Left w/o contrast   No orders of the defined types were placed in this encounter.     Procedures: No procedures performed   Clinical Data: No additional findings.   Subjective: Chief Complaint  Patient presents with  . Left Knee - Pain    Patient is a 44 year old female comes in for chronic left knee pain over the last year that has been worsening.  Denies any injuries.  She has noticed a bump on the back of her knee that radiates pain anteriorly.  She also has medial knee pain.  She feels like the pain is constant worse with stairs and prolonged walking.  Also endorses popping and giving way.  She has had a cortisone injection about 8 months ago which helped temporarily with Dr. Corliss Skains.  She has not had any previous surgeries.  Denies any numbness and tingling or radiculopathy.   Review of Systems  Constitutional: Negative.   HENT: Negative.   Eyes: Negative.   Respiratory: Negative.   Cardiovascular: Negative.   Endocrine: Negative.   Musculoskeletal: Negative.   Neurological: Negative.   Hematological: Negative.   Psychiatric/Behavioral: Negative.   All other systems  reviewed and are negative.    Objective: Vital Signs: Ht 5\' 1"  (1.549 m)   Wt 150 lb (68 kg)   BMI 28.34 kg/m   Physical Exam Vitals and nursing note reviewed.  Constitutional:      Appearance: She is well-developed.  HENT:     Head: Normocephalic and atraumatic.  Pulmonary:     Effort: Pulmonary effort is normal.  Abdominal:     Palpations: Abdomen is soft.  Musculoskeletal:     Cervical back: Neck supple.  Skin:    General: Skin is warm.     Capillary Refill: Capillary refill takes less than 2 seconds.  Neurological:     Mental Status: She is alert and oriented to person, place, and time.  Psychiatric:        Behavior: Behavior normal.        Thought Content: Thought content normal.        Judgment: Judgment normal.     Ortho Exam Left knee shows no significant joint effusion.  There is a palpable mass about pea-sized in the popliteal fossa.  This is nonmobile and firm.  Collaterals and cruciates are stable.  Range of motion is normal and painless.  Mild medial joint line tenderness.  Negative McMurray and Thessaly. Specialty Comments:  No specialty comments available.  Imaging: XR KNEE 3  VIEW LEFT  Result Date: 11/03/2019 No acute or structural abnormalities    PMFS History: Patient Active Problem List   Diagnosis Date Noted  . Depression, major, single episode, moderate (HCC) 07/25/2019  . Autoimmune disorder (HCC) 03/25/2018  . Neutropenia (HCC) 01/04/2018  . ANA positive 01/04/2018  . Dysplasia of cervix, high grade CIN 2 12/07/2014   Past Medical History:  Diagnosis Date  . Abnormal uterine bleeding   . History of chicken pox   . Urinary tract infection 12/2014  . Vaginal Pap smear, abnormal     Family History  Problem Relation Age of Onset  . Diabetes Mother   . Hypertension Mother   . High Cholesterol Mother   . Breast cancer Paternal Grandmother   . Healthy Sister   . Healthy Brother   . Healthy Brother   . Healthy Son   . Healthy Son     . Healthy Son     Past Surgical History:  Procedure Laterality Date  . CESAREAN SECTION    . LEEP     Social History   Occupational History  . Not on file  Tobacco Use  . Smoking status: Light Tobacco Smoker    Types: Cigarettes  . Smokeless tobacco: Never Used  . Tobacco comment: 4 CIG WEEKLY  Substance and Sexual Activity  . Alcohol use: Yes    Alcohol/week: 2.0 standard drinks    Types: 2 Cans of beer per week    Comment: occ  . Drug use: No  . Sexual activity: Not Currently    Birth control/protection: None

## 2019-11-08 ENCOUNTER — Telehealth: Payer: Self-pay | Admitting: Family Medicine

## 2019-11-08 NOTE — Telephone Encounter (Signed)
Please Advise

## 2019-11-08 NOTE — Telephone Encounter (Signed)
[name] called stating she is almost done with the treatment Dr. Artis Flock gave her for her fungus. [name] stated Dr. Artis Flock told her to let her know when she was almost done with the treatment so she could prescribe her another one. Please advise.

## 2019-11-09 ENCOUNTER — Other Ambulatory Visit: Payer: Self-pay | Admitting: Family Medicine

## 2019-11-09 DIAGNOSIS — Z5181 Encounter for therapeutic drug level monitoring: Secondary | ICD-10-CM

## 2019-11-09 NOTE — Telephone Encounter (Signed)
Please schedule lab appointment for pt.

## 2019-11-09 NOTE — Telephone Encounter (Signed)
She has to come in for lab test then if normal will send in remainder of treatment. We have to check liver function. Put in order, just make appointment.    Orland Mustard, MD [facility]

## 2019-12-01 ENCOUNTER — Other Ambulatory Visit (INDEPENDENT_AMBULATORY_CARE_PROVIDER_SITE_OTHER): Payer: 59

## 2019-12-01 ENCOUNTER — Other Ambulatory Visit: Payer: Self-pay

## 2019-12-01 DIAGNOSIS — Z5181 Encounter for therapeutic drug level monitoring: Secondary | ICD-10-CM

## 2019-12-02 ENCOUNTER — Other Ambulatory Visit: Payer: Self-pay | Admitting: Family Medicine

## 2019-12-02 LAB — HEPATIC FUNCTION PANEL
ALT: 17 U/L (ref 0–35)
AST: 25 U/L (ref 0–37)
Albumin: 4.4 g/dL (ref 3.5–5.2)
Alkaline Phosphatase: 71 U/L (ref 39–117)
Bilirubin, Direct: 0.1 mg/dL (ref 0.0–0.3)
Total Bilirubin: 0.4 mg/dL (ref 0.2–1.2)
Total Protein: 7 g/dL (ref 6.0–8.3)

## 2019-12-02 MED ORDER — TERBINAFINE HCL 250 MG PO TABS: 250.0000 mg | ORAL_TABLET | Freq: Every day | ORAL | 0 refills | Status: DC

## 2019-12-06 ENCOUNTER — Other Ambulatory Visit: Payer: Self-pay

## 2019-12-06 ENCOUNTER — Ambulatory Visit
Admission: RE | Admit: 2019-12-06 | Discharge: 2019-12-06 | Disposition: A | Discharge status: Home / Self Care | Payer: 59 | Source: Ambulatory Visit | Attending: Orthopaedic Surgery | Admitting: Orthopaedic Surgery

## 2019-12-06 DIAGNOSIS — M25562 Pain in left knee: Secondary | ICD-10-CM

## 2019-12-08 ENCOUNTER — Other Ambulatory Visit: Payer: Self-pay | Admitting: *Deleted

## 2019-12-08 DIAGNOSIS — M359 Systemic involvement of connective tissue, unspecified: Secondary | ICD-10-CM

## 2019-12-08 MED ORDER — HYDROXYCHLOROQUINE SULFATE 200 MG PO TABS: 200.0000 mg | ORAL_TABLET | Freq: Every day | ORAL | 0 refills | Status: DC

## 2019-12-08 NOTE — Telephone Encounter (Signed)
Refill request received via fax  Last Visit: 09/15/19 Next Visit: 02/22/20 Labs: 09/15/2019 WBC 3.4 CMP WNL. PLQ Eye Exam: 06/15/19  Current Dose per office note on 09/15/2019: Plaquenil 200 mg 1 tablet by mouth daily  Okay to refill per Dr. Corliss Skains

## 2019-12-13 ENCOUNTER — Ambulatory Visit (INDEPENDENT_AMBULATORY_CARE_PROVIDER_SITE_OTHER): Payer: 59 | Admitting: Orthopaedic Surgery

## 2019-12-13 ENCOUNTER — Encounter: Payer: Self-pay | Admitting: Orthopaedic Surgery

## 2019-12-13 DIAGNOSIS — M7122 Synovial cyst of popliteal space [Baker], left knee: Secondary | ICD-10-CM | POA: Insufficient documentation

## 2019-12-13 NOTE — Progress Notes (Signed)
Office Visit Note   Patient: [name]           Date of Birth: 1976-06-12           MRN: [id number] Visit Date: 12/13/2019              Requested by: Orland Mustard, MD [address] [place],  [place] [address] PCP: Orland Mustard, MD   Assessment & Plan: Visit Diagnoses:  1. Popliteal cyst, left     Plan: MRI shows a multiloculated popliteal cyst superficial to the neurovascular bundle along the semitendinosus muscle.  These findings were reviewed with the patient in detail and based on our discussion treatment options she would like to undergo cyst aspiration and cortisone injection in hopes of resolving the cyst.  She understands that there is not a small chance of recurrence but she may not necessarily be symptomatic from it.  She would like to make an appointment in the future to see Dr. Prince Rome for an ultrasound-guided popliteal cyst aspiration.  All questions answered to his satisfaction.  Follow-Up Instructions: Return for needs appt with Dr. Prince Rome for ultrasound guided popliteal cyst aspiration.   Orders:  No orders of the defined types were placed in this encounter.  No orders of the defined types were placed in this encounter.     Procedures: No procedures performed   Clinical Data: No additional findings.   Subjective: Chief Complaint  Patient presents with  . Left Knee - Pain    Patient is back today with language interpreter for MRI review for the left knee.  No changes in her symptoms.   Review of Systems   Objective: Vital Signs: There were no vitals taken for this visit.  Physical Exam  Ortho Exam Left knee exam shows a tender palpable popliteal cyst.  No joint effusion. Specialty Comments:  No specialty comments available.  Imaging: No results found.   PMFS History: Patient Active Problem List   Diagnosis Date Noted  . Popliteal cyst, left 12/13/2019  . Depression, major, single episode, moderate (HCC) 07/25/2019    . Autoimmune disorder (HCC) 03/25/2018  . Neutropenia (HCC) 01/04/2018  . ANA positive 01/04/2018  . Dysplasia of cervix, high grade CIN 2 12/07/2014   Past Medical History:  Diagnosis Date  . Abnormal uterine bleeding   . History of chicken pox   . Urinary tract infection 12/2014  . Vaginal Pap smear, abnormal     Family History  Problem Relation Age of Onset  . Diabetes Mother   . Hypertension Mother   . High Cholesterol Mother   . Breast cancer Paternal Grandmother   . Healthy Sister   . Healthy Brother   . Healthy Brother   . Healthy Son   . Healthy Son   . Healthy Son     Past Surgical History:  Procedure Laterality Date  . CESAREAN SECTION    . LEEP     Social History   Occupational History  . Not on file  Tobacco Use  . Smoking status: Light Tobacco Smoker    Types: Cigarettes  . Smokeless tobacco: Never Used  . Tobacco comment: 4 CIG WEEKLY  Substance and Sexual Activity  . Alcohol use: Yes    Alcohol/week: 2.0 standard drinks    Types: 2 Cans of beer per week    Comment: occ  . Drug use: No  . Sexual activity: Not Currently    Birth control/protection: None

## 2019-12-16 ENCOUNTER — Other Ambulatory Visit: Payer: Self-pay

## 2019-12-16 ENCOUNTER — Ambulatory Visit: Payer: Self-pay

## 2019-12-16 ENCOUNTER — Ambulatory Visit (INDEPENDENT_AMBULATORY_CARE_PROVIDER_SITE_OTHER): Payer: 59 | Admitting: Family Medicine

## 2019-12-16 ENCOUNTER — Encounter: Payer: Self-pay | Admitting: Family Medicine

## 2019-12-16 DIAGNOSIS — M7122 Synovial cyst of popliteal space [Baker], left knee: Secondary | ICD-10-CM | POA: Diagnosis not present

## 2019-12-16 NOTE — Progress Notes (Signed)
Subjective: She is here for ultrasound-guided left popliteal cyst aspiration.  Objective: She is tender to palpation in the popliteal fossa and there is a palpable cyst.  Limited diagnostic ultrasound: She has a multiloculated popliteal cyst.  Procedure: Ultrasound-guided left Baker's cyst aspiration and injection: After sterile prep with Betadine, injected 5 cc 1% lidocaine without epinephrine for anesthesia.  It did not flow well into the cyst.  Then using an 18-gauge needle, I attempted to aspirate the cyst but the material is gelatinous and would not flow into the syringe.  I then injected 40 mg methylprednisolone.  She will follow up with Dr. Roda Shutters in a couple months if this is not feeling better.  It may need to be surgically removed.

## 2019-12-27 ENCOUNTER — Other Ambulatory Visit: Payer: Self-pay | Admitting: Family Medicine

## 2020-01-19 ENCOUNTER — Other Ambulatory Visit: Payer: Self-pay

## 2020-01-19 ENCOUNTER — Ambulatory Visit (INDEPENDENT_AMBULATORY_CARE_PROVIDER_SITE_OTHER): Payer: 59 | Admitting: Family Medicine

## 2020-01-19 ENCOUNTER — Encounter: Payer: Self-pay | Admitting: Family Medicine

## 2020-01-19 VITALS — BP 124/76 | HR 91 | Temp 98.6°F | Ht 61.0 in | Wt 155.4 lb

## 2020-01-19 DIAGNOSIS — M5412 Radiculopathy, cervical region: Secondary | ICD-10-CM | POA: Diagnosis not present

## 2020-01-19 MED ORDER — DULOXETINE HCL 60 MG PO CPEP: 60.0000 mg | ORAL_CAPSULE | Freq: Every day | ORAL | 1 refills | Status: DC

## 2020-01-19 MED ORDER — CYCLOBENZAPRINE HCL 10 MG PO TABS
10.0000 mg | ORAL_TABLET | Freq: Three times a day (TID) | ORAL | 0 refills | Status: DC | PRN
Start: 2020-01-19 — End: 2020-07-11

## 2020-01-19 NOTE — Progress Notes (Signed)
Patient: [name] MRN: [id number] DOB: July 10, 1975 PCP: Orland Mustard, MD     Subjective:  Chief Complaint  Patient presents with  . Back Pain    HPI: The patient is a 44 y.o. female who presents today for for back pain that starts from the neck all the way down to lower back. radiates more into her left side and down her left arm and her left leg. pain can be a 9/10 and described as dull or pressure like. She also feels like her left side is weak. No loss of sensation. She says that it has hurt for a long time, but has worsened in the last 2 weeks. No precipitating factors. Denies any heavy lifting/trauma. She does have a vest/brace that she wears that does help some. If she lies flat on her back she has some pain, not that much.  Lifting or mopping make the pain worse. Walking doesn't seem to affect it. Tylenol, ibuprofen and nothing else. She is right handed. Has tingling into her left arm at times as well.   She is followed by rheumatology and has had chronic complaints of neck pain, left sided radiculopathy. This pain continues, but is going down her back.    Review of Systems  Musculoskeletal: Positive for back pain, neck pain and neck stiffness.  Neurological: Positive for headaches. Negative for dizziness and light-headedness.    Allergies Patient has No Known Allergies.  Past Medical History Patient  has a past medical history of Abnormal uterine bleeding, History of chicken pox, Urinary tract infection (12/2014), and Vaginal Pap smear, abnormal.  Surgical History Patient  has a past surgical history that includes Cesarean section and LEEP.  Family History Pateint's family history includes Breast cancer in her paternal grandmother; Diabetes in her mother; Healthy in her brother, brother, sister, son, son, and son; High Cholesterol in her mother; Hypertension in her mother.  Social History Patient  reports that she has been smoking cigarettes. She has never  used smokeless tobacco. She reports current alcohol use of about 2.0 standard drinks of alcohol per week. She reports that she does not use drugs.    Objective: Vitals:   01/19/20 1454  BP: 124/76  Pulse: 91  Temp: 98.6 F (37 C)  TempSrc: Temporal  SpO2: 98%  Weight: 155 lb 6.4 oz (70.5 kg)  Height: 5\' 1"  (1.549 m)    Body mass index is 29.36 kg/m.  Physical Exam Constitutional:      Appearance: She is normal weight.  HENT:     Head: Normocephalic and atraumatic.  Pulmonary:     Effort: Pulmonary effort is normal.  Musculoskeletal:        General: Tenderness present. No signs of injury.     Right lower leg: No edema.     Left lower leg: No edema.     Comments: She has ttp all along her cspine an traps bilaterally. She does have full ROM. Pain radiates into her left arm. Left arm strength just mildly decreased compared to the right, 4.5/5. sensation intact. Left arm ROM intact. No ttp along lumbar spin into SI joints. Some TTP along thoracic spine, more superiorly.   Skin:    Capillary Refill: Capillary refill takes less than 2 seconds.  Neurological:     General: No focal deficit present.     Mental Status: She is oriented to person, place, and time.  Psychiatric:        Mood and Affect: Mood normal.  Behavior: Behavior normal.        Assessment/plan: 1. Cervical radiculopathy Discussed could be autoimmune/fibro flair or she has something else going on. States she had xrays done, but I can not see these. States they were normal. We are going to increase her cymbalta to 60mg /daily and try her on flexeril. Offered steroids and she declined. Will get MRI of her neck to rule out any DDD/nerve impingement etc. Precautions given.  - MR Cervical Spine Wo Contrast; Future    This visit occurred during the SARS-CoV-2 public health emergency.  Safety protocols were in place, including screening questions prior to the visit, additional usage of staff PPE, and extensive  cleaning of exam room while observing appropriate contact time as indicated for disinfecting solutions.     Return if symptoms worsen or fail to improve.    Orland Mustard, MD [facility]   01/19/2020

## 2020-01-19 NOTE — Patient Instructions (Addendum)
I wonder if this is a flair of your auto immune or fibro or if this is something else going on in your neck/back.   -getting MRI of your neck, they will call you with this to set up.  -increasing your cymbalta to 60mg  a day- I sent in a new prescription for you.  -sending in new px of muscle relaxer. Will make you drowsy may want to take 1/2 pill.   -physical therapy is also a thought, but let's get Mri back first.  -can try voltaren gel on your neck, this is over the counter    i'll talk to you after I get mri back! Hang in there,  D.r 

## 2020-02-10 ENCOUNTER — Other Ambulatory Visit: Payer: Self-pay

## 2020-02-10 ENCOUNTER — Ambulatory Visit
Admission: RE | Admit: 2020-02-10 | Discharge: 2020-02-10 | Disposition: A | Discharge status: Home / Self Care | Payer: 59 | Source: Ambulatory Visit | Attending: Family Medicine | Admitting: Family Medicine

## 2020-02-10 DIAGNOSIS — M5412 Radiculopathy, cervical region: Secondary | ICD-10-CM

## 2020-02-10 NOTE — Progress Notes (Signed)
Office Visit Note  Patient: [name]             Date of Birth: 1976/02/16           MRN: [id number]             PCP: Orland Mustard, MD Referring: Orland Mustard, MD Visit Date: 02/22/2020 Occupation: @GUAROCC @  Interpreter: Lorinda Creed  Subjective:  Neck pain   History of Present Illness: [name] is a 44 y.o. female with history of autoimmune disease and osteoarthritis. She is currently taking plaquenil 200 mg 1 tablet by mouth daily.  She has been experiencing severe neck pain and radiating pain down her left arm.  She had a C-spine MRI on 02/10/20.  She has not see a neck specialist yet.  She has noticed intermittent swelling in the left knee joint.   She denies any recent rashes.  She has been experiencing more frequent oral ulcerations but denies nasal ulcerations.  She has chronic sicca symptoms.  She has been using biotene and oral gel for symptomatic relief. She has been experiencing increased fatigue.    Activities of Daily Living:  Patient reports morning stiffness for 20 minutes.   Patient Reports nocturnal pain.  Difficulty dressing/grooming: Reports Difficulty climbing stairs: Reports Difficulty getting out of chair: Reports Difficulty using hands for taps, buttons, cutlery, and/or writing: Reports  Review of Systems  Constitutional: Positive for fatigue.  HENT: Positive for mouth dryness. Negative for mouth sores and nose dryness.   Eyes: Positive for dryness.  Respiratory: Negative for shortness of breath and difficulty breathing.   Cardiovascular: Negative for chest pain and palpitations.  Gastrointestinal: Positive for constipation. Negative for blood in stool and diarrhea.  Endocrine: Negative for increased urination.  Genitourinary: Negative for difficulty urinating.  Musculoskeletal: Positive for arthralgias, joint pain, joint swelling, myalgias, morning stiffness, muscle tenderness and myalgias.  Skin: Positive for color  change. Negative for rash and redness.  Allergic/Immunologic: Negative for susceptible to infections.  Neurological: Positive for numbness, headaches and memory loss. Negative for dizziness and weakness.  Hematological: Positive for bruising/bleeding tendency.  Psychiatric/Behavioral: Negative for confusion.    PMFS History:  Patient Active Problem List   Diagnosis Date Noted  . Popliteal cyst, left 12/13/2019  . Depression, major, single episode, moderate (HCC) 07/25/2019  . Autoimmune disorder (HCC) 03/25/2018  . Neutropenia (HCC) 01/04/2018  . ANA positive 01/04/2018  . 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 Abnormal uterine bleeding   . History of chicken pox   . Urinary tract infection 12/2014  . Vaginal Pap smear, abnormal     Family History  Problem Relation Age of Onset  . Diabetes Mother   . Hypertension Mother   . High Cholesterol Mother   . Breast cancer Paternal Grandmother   . Healthy Sister   . Healthy Brother   . Healthy Brother   . Healthy Son   . Healthy Son   . Healthy Son    Past Surgical History:  Procedure Laterality Date  . CESAREAN SECTION    . LEEP     Social History   Social History Narrative  . Not on file   Immunization History  Administered Date(s) Administered  . Influenza,inj,Quad PF,6+ Mos 06/24/2018, 04/08/2019  . Pneumococcal Polysaccharide-23 03/25/2018  . Tdap 11/23/2014     Objective: Vital Signs: BP 125/82 (BP Location: Right Arm, Patient Position: Sitting, Cuff Size: Normal)   Pulse  90   Resp 15   Ht 5\' 1"  (1.549 m)   Wt 153 lb 12.8 oz (69.8 kg)   BMI 29.06 kg/m    Physical Exam Vitals and nursing note reviewed.  Constitutional:      Appearance: She is well-developed.  HENT:     Head: Normocephalic and atraumatic.  Eyes:     Conjunctiva/sclera: Conjunctivae normal.  Pulmonary:     Effort: Pulmonary effort is normal.  Abdominal:     Palpations: Abdomen is  soft.  Musculoskeletal:     Cervical back: Normal range of motion.  Lymphadenopathy:     Cervical: No cervical adenopathy.  Skin:    General: Skin is warm and dry.     Capillary Refill: Capillary refill takes less than 2 seconds.  Neurological:     Mental Status: She is alert and oriented to person, place, and time.  Psychiatric:        Behavior: Behavior normal.      Musculoskeletal Exam: C-spine painful ROM.  Thoracic and lumbar spine good ROM.  Shoulder joints, elbow joints, wrist joints, MCPs, PIPs, and DIPs good ROM with no synovitis.  Complete fist formation bilaterally.  Hip joints, knee joints, ankle joints, MTPs, PIPs, and DIPs good ROM with no synovitis.  No warmth or effusion of knee joints.  No tenderness or swelling of ankle joints.  CDAI Exam: CDAI Score: -- Patient Global: --; Provider Global: -- Swollen: --; Tender: -- Joint Exam 02/22/2020   No joint exam has been documented for this visit   There is currently no information documented on the homunculus. Go to the Rheumatology activity and complete the homunculus joint exam.  Investigation: No additional findings.  Imaging: MR Cervical Spine Wo Contrast  Result Date: 02/11/2020 CLINICAL DATA:  Left back and arm pain for 8 months EXAM: MRI CERVICAL SPINE WITHOUT CONTRAST TECHNIQUE: Multiplanar, multisequence MR imaging of the cervical spine was performed. No intravenous contrast was administered. COMPARISON:  None. FINDINGS: Alignment: There is straightening of the normal cervical lordosis Vertebrae: The vertebral body heights are well maintained. No fracture, marrow edema,or pathologic marrow infiltration. Cord: Normal signal and morphology. Posterior Fossa, vertebral arteries, paraspinal tissues: The visualized portion of the posterior fossa is unremarkable. Normal flow voids seen within the vertebral arteries. The paraspinal soft tissues are unremarkable. Disc levels: C1-C2: Atlantoaxial capsular hypertrophy is  seen. No canal stenosis however is noted. C2-C3: No significant spinal canal or neural foraminal narrowing C3-C4: Mild uncovertebral osteophytes are seen which causes mild left neural foraminal narrowing. C4-C5: Minimal disc osteophyte complex and uncovertebral osteophytes are seen which causes mild bilateral neural foraminal narrowing. C5-C6: Uncovertebral osteophytes and disc osteophyte complex are seen which causes mild bilateral foraminal narrowing. C6-C7: There is a disc osteophyte complex which is asymmetric to the left which contacts the exiting left C7 nerve root without impingement. Uncovertebral osteophytes are seen which causes mild left-sided neural foraminal narrowing. C7-T1: No significant spinal canal or neural foraminal narrowing IMPRESSION: Cervical spine spondylosis most notable at C6-C7 with a asymmetric disc osteophyte complex contacting the exiting left C7 nerve root without impingement and mild left neural foraminal narrowing. Electronically Signed   By: Jonna Clark M.D.   On: 02/11/2020 03:25    Recent Labs: Lab Results  Component Value Date   WBC 3.4 (L) 09/15/2019   HGB 12.5 09/15/2019   PLT 258 09/15/2019   NA 137 09/15/2019   K 4.1 09/15/2019   CL 103 09/15/2019   CO2 28 09/15/2019  GLUCOSE 100 (H) 09/15/2019   BUN 16 09/15/2019   CREATININE 0.76 09/15/2019   BILITOT 0.4 12/01/2019   ALKPHOS 71 12/01/2019   AST 25 12/01/2019   ALT 17 12/01/2019   PROT 7.0 12/01/2019   ALBUMIN 4.4 12/01/2019   CALCIUM 10.0 09/15/2019   GFRAA 113 09/15/2019    Speciality Comments: PLQ Eye Exam: 06/15/19 @ [facility] Follow up in 1 year  Procedures:  No procedures performed Allergies: Patient has no known allergies.   Assessment / Plan:     Visit Diagnoses: Autoimmune disease (HCC) - Positive ANA 1: 5120 nucleolar, positive SSA a, neutropenia, photosensitivity, oral ulcers and arthritis:  She has persistent arthralgias, oral ulcerations, sicca symptoms, fatigue,  and neutropenia.  She is currently taking plaquenil 200 mg 1 tablet by mouth daily.  She is tolerating PLQ without any side effects.  She is experiencing significant neck pain and left sided radiculopathy.  She has no synovitis or joint inflammation on exam.  She has not had any recent rashes.  No malar rash noted on exam.  She has not had any recent symptoms of Raynaud's and no digital ulcerations were noted.  autoimmune lab work from 09/15/19 was reviewed today in the office.  We will repeat lab work today.  If her WBC count is stable we will increase her dose of PLQ to 200 mg BID M-F.  She was advised to notify us if she develops any new or worsening symptoms.  She will follow up in  2 months. - Plan: CBC with Differential/Platelet, COMPLETE METABOLIC PANEL WITH GFR, Urinalysis, Routine w reflex microscopic, Anti-DNA antibody, double-stranded, C3 and C4, Sedimentation rate, ANA, Sjogrens syndrome-A extractable nuclear antibody, Rheumatoid factor, Serum protein electrophoresis with reflex  High risk medication use - Plaquenil 200 mg 1 tablet by mouth daily. She is on the reduced dose of PLQ due to history of neutropenia. Eye Exam: 06/15/19.  CBC and CMP were drawn on 09/15/19.  WBC count was 3.4 at that time.  We will repeat CBC and CMP today. - Plan: CBC with Differential/Platelet, COMPLETE METABOLIC PANEL WITH GFR She has declined receiving the covid-19 vaccination. We strongly encouraged to her receive the vaccination. She was advised to notify us if she develops the covid infection in order to receive the antibody infusion.   Primary osteoarthritis of both knees: She has good ROM of both knee joints.  No warmth or effusion noted.    Primary osteoarthritis of both feet: She is not having any discomfort in her feet at this time.   Other fatigue: She experiences significant fatigue.  We discussed the importance of regular exercise and good sleep hygiene.   History of neutropenia - WBC count was 3.4 on  09/15/19.  CBC will be drawn today.  Plan: CBC with Differential/Platelet  Neck pain: Chronic pain. She has persistent pain and stiffness in her C-spine. She has been experiencing left sided radiculopathy. She has not had any pain relief taking tylenol or NSAIDs. She had a MRI of the c-spine on 02/10/20 which revealed cervical spine spondylosis most notable at C6-C7 with asymmetric disc osteophyte complex contacting the exiting the left C7 nerve root without impingement and mild left neural foraminal narrowing. We will place a referral to neurosurgery for further evaluation and treatment.    Language barrier: Interpreter: Lorinda Creed  Orders: Orders Placed This Encounter  Procedures  . CBC with Differential/Platelet  . COMPLETE METABOLIC PANEL WITH GFR  . Urinalysis, Routine w reflex microscopic  .  Anti-DNA antibody, double-stranded  . C3 and C4  . Sedimentation rate  . ANA  . Sjogrens syndrome-A extractable nuclear antibody  . Rheumatoid factor  . Serum protein electrophoresis with reflex  . Ambulatory referral to Neurosurgery   No orders of the defined types were placed in this encounter.    Follow-Up Instructions: Return in about 2 months (around 04/23/2020) for Autoimmune Disease, Osteoarthritis.   Sherron Ales, PA-C  I examined and evaluated the patient with Sherron Ales PA.  Patient complains of part of neck discomfort and radiculopathy.  We will refer her to neurosurgery.  She had no synovitis on examination.  We will obtain autoimmune labs.  The plan of care was discussed as noted above.  Pollyann Savoy, MD  Note - This record has been created using Animal nutritionist.  Chart creation errors have been sought, but may not always  have been located. Such creation errors do not reflect on  the standard of medical care.

## 2020-02-13 ENCOUNTER — Encounter: Payer: Self-pay | Admitting: Family Medicine

## 2020-02-13 DIAGNOSIS — M4722 Other spondylosis with radiculopathy, cervical region: Secondary | ICD-10-CM

## 2020-02-22 ENCOUNTER — Encounter: Payer: Self-pay | Admitting: Rheumatology

## 2020-02-22 ENCOUNTER — Ambulatory Visit (INDEPENDENT_AMBULATORY_CARE_PROVIDER_SITE_OTHER): Payer: 59 | Admitting: Rheumatology

## 2020-02-22 ENCOUNTER — Other Ambulatory Visit: Payer: Self-pay

## 2020-02-22 VITALS — BP 125/82 | HR 90 | Resp 15 | Ht 61.0 in | Wt 153.8 lb

## 2020-02-22 DIAGNOSIS — M170 Bilateral primary osteoarthritis of knee: Secondary | ICD-10-CM | POA: Diagnosis not present

## 2020-02-22 DIAGNOSIS — R5383 Other fatigue: Secondary | ICD-10-CM

## 2020-02-22 DIAGNOSIS — Z79899 Other long term (current) drug therapy: Secondary | ICD-10-CM

## 2020-02-22 DIAGNOSIS — M5410 Radiculopathy, site unspecified: Secondary | ICD-10-CM

## 2020-02-22 DIAGNOSIS — M19071 Primary osteoarthritis, right ankle and foot: Secondary | ICD-10-CM | POA: Diagnosis not present

## 2020-02-22 DIAGNOSIS — Z862 Personal history of diseases of the blood and blood-forming organs and certain disorders involving the immune mechanism: Secondary | ICD-10-CM

## 2020-02-22 DIAGNOSIS — M19072 Primary osteoarthritis, left ankle and foot: Secondary | ICD-10-CM

## 2020-02-22 DIAGNOSIS — Z789 Other specified health status: Secondary | ICD-10-CM

## 2020-02-22 DIAGNOSIS — M542 Cervicalgia: Secondary | ICD-10-CM

## 2020-02-22 DIAGNOSIS — M359 Systemic involvement of connective tissue, unspecified: Secondary | ICD-10-CM

## 2020-02-22 NOTE — Patient Instructions (Signed)

## 2020-02-23 NOTE — Progress Notes (Signed)
Drop in WBC count noted.  She had no synovitis on examination.  Her complements and sed rate are normal.  I would recommend increasing Plaquenil to 1 tablet p.o. twice daily alternating with 1 tablet p.o. daily Monday to Friday only.  Repeat CBC with differential in 1 month.  If patients wants a referral for evaluation at [facility] please do so.

## 2020-02-24 ENCOUNTER — Telehealth: Payer: Self-pay | Admitting: *Deleted

## 2020-02-24 DIAGNOSIS — M359 Systemic involvement of connective tissue, unspecified: Secondary | ICD-10-CM

## 2020-02-24 LAB — SEDIMENTATION RATE: Sed Rate: 6 mm/h (ref 0–20)

## 2020-02-24 LAB — COMPLETE METABOLIC PANEL WITH GFR
AG Ratio: 1.5 (calc) (ref 1.0–2.5)
ALT: 14 U/L (ref 6–29)
AST: 19 U/L (ref 10–30)
Albumin: 4.4 g/dL (ref 3.6–5.1)
Alkaline phosphatase (APISO): 70 U/L (ref 31–125)
BUN: 13 mg/dL (ref 7–25)
CO2: 28 mmol/L (ref 20–32)
Calcium: 9.1 mg/dL (ref 8.6–10.2)
Chloride: 103 mmol/L (ref 98–110)
Creat: 0.7 mg/dL (ref 0.50–1.10)
GFR, Est African American: 125 mL/min/{1.73_m2} (ref >=60)
GFR, Est Non African American: 108 mL/min/{1.73_m2} (ref >=60)
Globulin: 3 g/dL (calc) (ref 1.9–3.7)
Glucose, Bld: 94 mg/dL (ref 65–99)
Potassium: 3.8 mmol/L (ref 3.5–5.3)
Sodium: 138 mmol/L (ref 135–146)
Total Bilirubin: 0.3 mg/dL (ref 0.2–1.2)
Total Protein: 7.4 g/dL (ref 6.1–8.1)

## 2020-02-24 LAB — URINALYSIS, ROUTINE W REFLEX MICROSCOPIC
Bilirubin Urine: NEGATIVE
Glucose, UA: NEGATIVE
Hgb urine dipstick: NEGATIVE
Ketones, ur: NEGATIVE
Leukocytes,Ua: NEGATIVE
Nitrite: NEGATIVE
Protein, ur: NEGATIVE
Specific Gravity, Urine: 1.026 (ref 1.001–1.03)
pH: 6 (ref 5.0–8.0)

## 2020-02-24 LAB — CBC WITH DIFFERENTIAL/PLATELET
Absolute Monocytes: 369 cells/uL (ref 200–950)
Basophils Absolute: 60 cells/uL (ref 0–200)
Basophils Relative: 2.3 %
Eosinophils Absolute: 60 cells/uL (ref 15–500)
Eosinophils Relative: 2.3 %
HCT: 37.6 % (ref 35.0–45.0)
Hemoglobin: 12.5 g/dL (ref 11.7–15.5)
Lymphs Abs: 1105 cells/uL (ref 850–3900)
MCH: 31.3 pg (ref 27.0–33.0)
MCHC: 33.2 g/dL (ref 32.0–36.0)
MCV: 94.2 fL (ref 80.0–100.0)
MPV: 9.3 fL (ref 7.5–12.5)
Monocytes Relative: 14.2 %
Neutro Abs: 1006 cells/uL — ABNORMAL LOW (ref 1500–7800)
Neutrophils Relative %: 38.7 %
Platelets: 272 10*3/uL (ref 140–400)
RBC: 3.99 10*6/uL (ref 3.80–5.10)
RDW: 12.7 % (ref 11.0–15.0)
Total Lymphocyte: 42.5 %
WBC: 2.6 10*3/uL — ABNORMAL LOW (ref 3.8–10.8)

## 2020-02-24 LAB — PROTEIN ELECTROPHORESIS, SERUM, WITH REFLEX
Albumin ELP: 4.5 g/dL (ref 3.8–4.8)
Alpha 1: 0.2 g/dL (ref 0.2–0.3)
Alpha 2: 0.6 g/dL (ref 0.5–0.9)
Beta 2: 0.5 g/dL (ref 0.2–0.5)
Beta Globulin: 0.5 g/dL (ref 0.4–0.6)
Gamma Globulin: 1.3 g/dL (ref 0.8–1.7)
Total Protein: 7.5 g/dL (ref 6.1–8.1)

## 2020-02-24 LAB — RHEUMATOID FACTOR: Rheumatoid fact SerPl-aCnc: <14 IU/mL (ref <14)

## 2020-02-24 LAB — C3 AND C4
C3 Complement: 96 mg/dL (ref 83–193)
C4 Complement: 22 mg/dL (ref 15–57)

## 2020-02-24 LAB — SJOGRENS SYNDROME-A EXTRACTABLE NUCLEAR ANTIBODY: SSA (Ro) (ENA) Antibody, IgG: >8 AI — AB

## 2020-02-24 LAB — ANTI-NUCLEAR AB-TITER (ANA TITER)
ANA TITER: 1:1280 {titer} — ABNORMAL HIGH
ANA Titer 1: 1:1280 {titer} — ABNORMAL HIGH

## 2020-02-24 LAB — ANTI-DNA ANTIBODY, DOUBLE-STRANDED: ds DNA Ab: <1 IU/mL

## 2020-02-24 LAB — ANA: Anti Nuclear Antibody (ANA): POSITIVE — AB

## 2020-02-24 NOTE — Telephone Encounter (Signed)
-----   Message from Pollyann Savoy, MD sent at 02/23/2020  4:47 PM EDT ----- Drop in WBC count noted.  She had no synovitis on examination.  Her complements and sed rate are normal.  I would recommend increasing Plaquenil to 1 tablet p.o. twice daily alternating with 1 tablet p.o. daily Monday to Friday only.  Repeat CBC with  differential in 1 month.  If patients wants a referral for evaluation at [facility] please do so.

## 2020-02-28 ENCOUNTER — Other Ambulatory Visit: Payer: Self-pay | Admitting: *Deleted

## 2020-02-28 MED ORDER — HYDROXYCHLOROQUINE SULFATE 200 MG PO TABS: ORAL_TABLET | ORAL | 0 refills | Status: DC

## 2020-02-28 NOTE — Telephone Encounter (Signed)
-----   Message from Shaili Deveshwar, MD sent at 02/28/2020  8:17 AM EDT ----- She will be on Plaquenil 2 tablets alternating with 1 Monday to Friday. 

## 2020-02-28 NOTE — Progress Notes (Signed)
She will be on Plaquenil 2 tablets alternating with 1 Monday to Friday.

## 2020-02-28 NOTE — Telephone Encounter (Signed)
-----   Message from Pollyann Savoy, MD sent at 02/28/2020  8:17 AM EDT ----- She will be on Plaquenil 2 tablets alternating with 1 Monday to Friday.

## 2020-03-06 ENCOUNTER — Other Ambulatory Visit: Payer: Self-pay | Admitting: Rheumatology

## 2020-03-16 ENCOUNTER — Telehealth: Payer: Self-pay | Admitting: Rheumatology

## 2020-03-16 NOTE — Telephone Encounter (Signed)
Re-faxed referral.

## 2020-03-16 NOTE — Telephone Encounter (Signed)
Patient called stating she called Dr. Nydia Bouton, Rheumatologist at [facility] to schedule an appointment and was told they haven't received the referral from Dr. Corliss Skains.  Patient is requesting the referral be faxed to [phone].

## 2020-03-26 ENCOUNTER — Other Ambulatory Visit: Payer: Self-pay | Admitting: Family Medicine

## 2020-04-10 NOTE — Progress Notes (Deleted)
Office Visit Note  Patient: [name]             Date of Birth: [birth year]           MRN: [id number]             PCP: Orland Mustard, MD Referring: Orland Mustard, MD Visit Date: 04/24/2020 Occupation: @GUAROCC @  Subjective:  No chief complaint on file.   History of Present Illness: [name] is a 44 y.o. female ***   Activities of Daily Living:  Patient reports morning stiffness for *** {minute/hour:19697}.   Patient {ACTIONS;DENIES/REPORTS:21021675::"Denies"} nocturnal pain.  Difficulty dressing/grooming: {ACTIONS;DENIES/REPORTS:21021675::"Denies"} Difficulty climbing stairs: {ACTIONS;DENIES/REPORTS:21021675::"Denies"} Difficulty getting out of chair: {ACTIONS;DENIES/REPORTS:21021675::"Denies"} Difficulty using hands for taps, buttons, cutlery, and/or writing: {ACTIONS;DENIES/REPORTS:21021675::"Denies"}  No Rheumatology ROS completed.   PMFS History:  Patient Active Problem List   Diagnosis Date Noted  . Popliteal cyst, left 12/13/2019  . Depression, major, single episode, moderate (HCC) 07/25/2019  . Autoimmune disorder (HCC) 03/25/2018  . Neutropenia (HCC) 01/04/2018  . ANA positive 01/04/2018  . 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 Abnormal uterine bleeding   . History of chicken pox   . Urinary tract infection 12/2014  . Vaginal Pap smear, abnormal     Family History  Problem Relation Age of Onset  . Diabetes Mother   . Hypertension Mother   . High Cholesterol Mother   . Breast cancer Paternal Grandmother   . Healthy Sister   . Healthy Brother   . Healthy Brother   . Healthy Son   . Healthy Son   . Healthy Son    Past Surgical History:  Procedure Laterality Date  . CESAREAN SECTION    . LEEP     Social History   Social History Narrative  . Not on file   Immunization History  Administered Date(s) Administered  . Influenza,inj,Quad PF,6+ Mos 06/24/2018,  04/08/2019  . Pneumococcal Polysaccharide-23 03/25/2018  . Tdap 11/23/2014     Objective: Vital Signs: There were no vitals taken for this visit.   Physical Exam   Musculoskeletal Exam: ***  CDAI Exam: CDAI Score: -- Patient Global: --; Provider Global: -- Swollen: --; Tender: -- Joint Exam 04/24/2020   No joint exam has been documented for this visit   There is currently no information documented on the homunculus. Go to the Rheumatology activity and complete the homunculus joint exam.  Investigation: No additional findings.  Imaging: No results found.  Recent Labs: Lab Results  Component Value Date   WBC 2.6 (L) 02/22/2020   HGB 12.5 02/22/2020   PLT 272 02/22/2020   NA 138 02/22/2020   K 3.8 02/22/2020   CL 103 02/22/2020   CO2 28 02/22/2020   GLUCOSE 94 02/22/2020   BUN 13 02/22/2020   CREATININE 0.70 02/22/2020   BILITOT 0.3 02/22/2020   ALKPHOS 71 12/01/2019   AST 19 02/22/2020   ALT 14 02/22/2020   PROT 7.4 02/22/2020   PROT 7.5 02/22/2020   ALBUMIN 4.4 12/01/2019   CALCIUM 9.1 02/22/2020   GFRAA 125 02/22/2020    Speciality Comments: PLQ Eye Exam: 06/15/19 @ [facility] Follow up in 1 year  Procedures:  No procedures performed Allergies: Patient has no known allergies.   Assessment / Plan:     Visit Diagnoses: No diagnosis found.  Orders: No orders of the defined types were placed in this encounter.  No orders of the defined types  were placed in this encounter.   Face-to-face time spent with patient was *** minutes. Greater than 50% of time was spent in counseling and coordination of care.  Follow-Up Instructions: No follow-ups on file.   Ellen Henri, CMA  Note - This record has been created using Animal nutritionist.  Chart creation errors have been sought, but may not always  have been located. Such creation errors do not reflect on  the standard of medical care.

## 2020-04-16 ENCOUNTER — Other Ambulatory Visit: Payer: Self-pay | Admitting: Rheumatology

## 2020-04-24 ENCOUNTER — Ambulatory Visit: Payer: 59 | Admitting: Rheumatology

## 2020-05-28 ENCOUNTER — Encounter: Payer: Self-pay | Admitting: Rheumatology

## 2020-05-28 ENCOUNTER — Encounter: Payer: Self-pay | Admitting: Orthopaedic Surgery

## 2020-06-27 ENCOUNTER — Other Ambulatory Visit: Payer: Self-pay | Admitting: Rheumatology

## 2020-07-05 ENCOUNTER — Encounter: Payer: Self-pay | Admitting: *Deleted

## 2020-07-06 ENCOUNTER — Other Ambulatory Visit: Payer: Self-pay | Admitting: Rheumatology

## 2020-07-09 NOTE — Telephone Encounter (Signed)
Okay to schedule appointment

## 2020-07-09 NOTE — Progress Notes (Signed)
Office Visit Note  Patient: [name]             Date of Birth: [birth year]           MRN: [id number]             PCP: Orland Mustard, MD Referring: Orland Mustard, MD Visit Date: 07/11/2020 Occupation: @GUAROCC @  Interpreter:  Marta Col  Subjective:  Other (Patient has been out of plaquenil for 2 weeks. )   History of Present Illness: [name] is a 45 y.o. female with history of autoimmune disease.  She was seen at [facility] for last few months.  She finds the commute difficult and wants to come back to our clinic.  She gives history of increased fatigue, dry mouth and dry eyes.  She continues to have pain and discomfort in all of her joints.  She denies any history of joint swelling.  There is no history of oral ulcers, nasal ulcers, malar rash, Raynaud's phenomenon or lymphadenopathy.  She has been experiencing increased fatigue.  She states that her symptoms are worse since she has been out of Plaquenil.  Activities of Daily Living:  Patient reports morning stiffness for 2 minutes.   Patient Reports nocturnal pain.  Difficulty dressing/grooming: Reports Difficulty climbing stairs: Denies Difficulty getting out of chair: Denies Difficulty using hands for taps, buttons, cutlery, and/or writing: Reports  Review of Systems  Constitutional: Positive for appetite change and fatigue. Negative for night sweats, weight gain and weight loss.  HENT: Positive for mouth dryness. Negative for mouth sores, trouble swallowing, trouble swallowing and nose dryness.   Eyes: Positive for dryness. Negative for pain, redness, itching and visual disturbance.  Respiratory: Negative for cough, shortness of breath and difficulty breathing.   Cardiovascular: Negative for chest pain, palpitations, hypertension, irregular heartbeat and swelling in legs/feet.  Gastrointestinal: Positive for constipation. Negative for blood in stool and diarrhea.  Endocrine:  Negative for increased urination.  Genitourinary: Negative for difficulty urinating and vaginal dryness.  Musculoskeletal: Positive for arthralgias, joint pain, myalgias, morning stiffness, muscle tenderness and myalgias. Negative for joint swelling and muscle weakness.  Skin: Positive for color change. Negative for rash, hair loss, redness, skin tightness, ulcers and sensitivity to sunlight.  Allergic/Immunologic: Negative for susceptible to infections.  Neurological: Positive for dizziness, numbness and headaches. Negative for memory loss, night sweats and weakness.  Hematological: Positive for bruising/bleeding tendency. Negative for swollen glands.  Psychiatric/Behavioral: Negative for depressed mood, confusion and sleep disturbance. The patient is not nervous/anxious.     PMFS History:  Patient Active Problem List   Diagnosis Date Noted  . Popliteal cyst, left 12/13/2019  . Depression, major, single episode, moderate (HCC) 07/25/2019  . Autoimmune disorder (HCC) 03/25/2018  . Neutropenia (HCC) 01/04/2018  . ANA positive 01/04/2018  . 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 Abnormal uterine bleeding   . History of chicken pox   . Urinary tract infection 12/2014  . Vaginal Pap smear, abnormal     Family History  Problem Relation Age of Onset  . Diabetes Mother   . Hypertension Mother   . High Cholesterol Mother   . Breast cancer Paternal Grandmother   . Healthy Sister   . Healthy Brother   . Healthy Brother   . Healthy Son   . Healthy Son   . Healthy Son    Past Surgical History:  Procedure Laterality Date  . CESAREAN  SECTION    . LEEP     Social History   Social History Narrative  . Not on file   Immunization History  Administered Date(s) Administered  . Influenza,inj,Quad PF,6+ Mos 06/24/2018, 04/08/2019  . Pneumococcal Polysaccharide-23 03/25/2018  . Tdap 11/23/2014     Objective: Vital Signs: BP  122/86 (BP Location: Left Arm, Patient Position: Sitting, Cuff Size: Normal)   Pulse 90   Resp 14   Ht 5\' 1"  (1.549 m)   Wt 153 lb (69.4 kg)   BMI 28.91 kg/m    Physical Exam Vitals and nursing note reviewed.  Constitutional:      Appearance: She is well-developed and well-nourished.  HENT:     Head: Normocephalic and atraumatic.  Eyes:     Extraocular Movements: EOM normal.     Conjunctiva/sclera: Conjunctivae normal.  Cardiovascular:     Rate and Rhythm: Normal rate and regular rhythm.     Pulses: Intact distal pulses.     Heart sounds: Normal heart sounds.  Pulmonary:     Effort: Pulmonary effort is normal.     Breath sounds: Normal breath sounds.  Abdominal:     General: Bowel sounds are normal.     Palpations: Abdomen is soft.  Musculoskeletal:     Cervical back: Normal range of motion.  Lymphadenopathy:     Cervical: No cervical adenopathy.  Skin:    General: Skin is warm and dry.     Capillary Refill: Capillary refill takes less than 2 seconds.  Neurological:     Mental Status: She is alert and oriented to person, place, and time.  Psychiatric:        Mood and Affect: Mood and affect normal.        Behavior: Behavior normal.      Musculoskeletal Exam: C-spine thoracic and lumbar spine were in good range of motion.  Shoulder joints, elbow joints, wrist joints, MCPs PIPs and DIPs with good range of motion without synovitis.  Hip joints, knee joints, ankles, MTPs and PIPs with good range of motion with no synovitis.  CDAI Exam: CDAI Score: -- Patient Global: --; Provider Global: -- Swollen: --; Tender: -- Joint Exam 07/11/2020   No joint exam has been documented for this visit   There is currently no information documented on the homunculus. Go to the Rheumatology activity and complete the homunculus joint exam.  Investigation: No additional findings.  Imaging: No results found.  Recent Labs: Lab Results  Component Value Date   WBC 2.6 (L) 02/22/2020    HGB 12.5 02/22/2020   PLT 272 02/22/2020   NA 138 02/22/2020   K 3.8 02/22/2020   CL 103 02/22/2020   CO2 28 02/22/2020   GLUCOSE 94 02/22/2020   BUN 13 02/22/2020   CREATININE 0.70 02/22/2020   BILITOT 0.3 02/22/2020   ALKPHOS 71 12/01/2019   AST 19 02/22/2020   ALT 14 02/22/2020   PROT 7.4 02/22/2020   PROT 7.5 02/22/2020   ALBUMIN 4.4 12/01/2019   CALCIUM 9.1 02/22/2020   GFRAA 125 02/22/2020    Speciality Comments: PLQ Eye Exam: 06/15/19 @ [facility] Follow up in 1 year  Procedures:  No procedures performed Allergies: Patient has no known allergies.   Assessment / Plan:     Visit Diagnoses: Autoimmune disease (HCC) - Positive ANA 1: 5120 nucleolar, positive SSA a, neutropenia, photosensitivity, oral ulcers and arthritis: -She has been on Plaquenil since her last visit.  She admits to have Plaquenil for last  2 weeks.  She has not had an eye examination since December 2020.  The risk of ocular toxicity was emphasized.  She states she will schedule an eye examination as soon as possible.  Plan: Urinalysis, Routine w reflex microscopic, Anti-DNA antibody, double-stranded, C3 and C4, Sedimentation rate  High risk medication use - Plaquenil 2 tablets alternating with 1 Monday to Friday. She is on the reduced dose of PLQ due to history of neutropenia. PLQ Eye Exam: 06/15/19 - Plan: CBC with Differential/Platelet, COMPLETE METABOLIC PANEL WITH GFR today and then every 3 months to monitor for drug toxicity.  Patient has not received COVID-19 vaccination.  Primary osteoarthritis of both knees-she continues to have some discomfort in her knee joints.  No warmth swelling effusion was noted.  Primary osteoarthritis of both feet-she has some discomfort in her feet.  Other fatigue-she complains of increased fatigue.  History of neutropenia-she has chronic neutropenia.  Neck pain - MRI of the c-spine on 02/10/20.  She has some disc disease and chronic pain.  Language  barrier-an interpreter was present in the office for translation.  Educate about COVID-19 virus infection-use of mask, social distancing and hand hygiene was discussed.  She was advised to get COVID-19 vaccination.  Orders: Orders Placed This Encounter  Procedures  . CBC with Differential/Platelet  . COMPLETE METABOLIC PANEL WITH GFR  . Urinalysis, Routine w reflex microscopic  . Anti-DNA antibody, double-stranded  . C3 and C4  . Sedimentation rate   Meds ordered this encounter  Medications  . hydroxychloroquine (PLAQUENIL) 200 MG tablet    Sig: Take 1 tablet BID  Every other day alternating with 1 tablet every other Monday-Friday only.    Dispense:  90 tablet    Refill:  0     Follow-Up Instructions: Return in about 3 months (around 10/09/2020) for Autoimmune disease.   Pollyann Savoy, MD  Note - This record has been created using Animal nutritionist.  Chart creation errors have been sought, but may not always  have been located. Such creation errors do not reflect on  the standard of medical care.

## 2020-07-11 ENCOUNTER — Other Ambulatory Visit: Payer: Self-pay

## 2020-07-11 ENCOUNTER — Encounter: Payer: Self-pay | Admitting: Rheumatology

## 2020-07-11 ENCOUNTER — Ambulatory Visit (INDEPENDENT_AMBULATORY_CARE_PROVIDER_SITE_OTHER): Payer: 59 | Admitting: Rheumatology

## 2020-07-11 VITALS — BP 122/86 | HR 90 | Resp 14 | Ht 61.0 in | Wt 153.0 lb

## 2020-07-11 DIAGNOSIS — R5383 Other fatigue: Secondary | ICD-10-CM

## 2020-07-11 DIAGNOSIS — M542 Cervicalgia: Secondary | ICD-10-CM

## 2020-07-11 DIAGNOSIS — Z789 Other specified health status: Secondary | ICD-10-CM

## 2020-07-11 DIAGNOSIS — Z79899 Other long term (current) drug therapy: Secondary | ICD-10-CM

## 2020-07-11 DIAGNOSIS — M359 Systemic involvement of connective tissue, unspecified: Secondary | ICD-10-CM | POA: Diagnosis not present

## 2020-07-11 DIAGNOSIS — M170 Bilateral primary osteoarthritis of knee: Secondary | ICD-10-CM

## 2020-07-11 DIAGNOSIS — Z862 Personal history of diseases of the blood and blood-forming organs and certain disorders involving the immune mechanism: Secondary | ICD-10-CM

## 2020-07-11 DIAGNOSIS — M19071 Primary osteoarthritis, right ankle and foot: Secondary | ICD-10-CM

## 2020-07-11 DIAGNOSIS — M19072 Primary osteoarthritis, left ankle and foot: Secondary | ICD-10-CM

## 2020-07-11 DIAGNOSIS — Z7189 Other specified counseling: Secondary | ICD-10-CM

## 2020-07-11 MED ORDER — HYDROXYCHLOROQUINE SULFATE 200 MG PO TABS: ORAL_TABLET | ORAL | 0 refills | Status: DC

## 2020-07-11 NOTE — Patient Instructions (Addendum)
Please a schedule eye examination as soon as possible  Standing Labs We placed an order today for your standing lab work.   Please have your standing labs drawn in April and every 3 months  If possible, please have your labs drawn 2 weeks prior to your appointment so that the provider can discuss your results at your appointment.  We have open lab daily Monday through Thursday from 8:30-12:30 PM and 1:30-4:30 PM and Friday from 8:30-12:30 PM and 1:30-4:00 PM at the office of Dr. Pollyann Savoy, [facility].   Please be advised, patients with office appointments requiring lab work will take precedents over walk-in lab work.  If possible, please come for your lab work on Monday and Friday afternoons, as you may experience shorter wait times. The office is located at [address], [place], [place] [address] No appointment is necessary.   Labs are drawn by [facility]. Please bring your co-pay at the time of your lab draw.  You may receive a bill from [facility] for your lab work.  If you wish to have your labs drawn at another location, please call the office 24 hours in advance to send orders.  If you have any questions regarding directions or hours of operation,  please call [phone].   As a reminder, please drink plenty of water prior to coming for your lab work. Thanks!  COVID-19 vaccine recommendations:   COVID-19 vaccine is recommended for everyone (unless you are allergic to a vaccine component), even if you are on a medication that suppresses your immune system.   Do not take Tylenol or any anti-inflammatory medications (NSAIDs) 24 hours prior to the COVID-19 vaccination.   There is no direct evidence about the efficacy of the COVID-19 vaccine in individuals who are on medications that suppress the immune system.   Even if you are fully vaccinated, and you are on any medications that suppress your immune system, please continue to wear a mask, maintain at  least six feet social distance and practice hand hygiene.   If you develop a COVID-19 infection, please contact your PCP or our office to determine if you need monoclonal antibody infusion.  The booster vaccine is now available for immunocompromised patients.   Please see the following web sites for updated information.   [email or web address]

## 2020-07-12 ENCOUNTER — Other Ambulatory Visit: Payer: Self-pay

## 2020-07-12 DIAGNOSIS — Z79899 Other long term (current) drug therapy: Secondary | ICD-10-CM

## 2020-07-12 DIAGNOSIS — Z862 Personal history of diseases of the blood and blood-forming organs and certain disorders involving the immune mechanism: Secondary | ICD-10-CM

## 2020-07-12 LAB — CBC WITH DIFFERENTIAL/PLATELET
Absolute Monocytes: 260 cells/uL (ref 200–950)
Basophils Absolute: 30 cells/uL (ref 0–200)
Basophils Relative: 1.5 %
Eosinophils Absolute: 50 cells/uL (ref 15–500)
Eosinophils Relative: 2.5 %
HCT: 39.4 % (ref 35.0–45.0)
Hemoglobin: 13.4 g/dL (ref 11.7–15.5)
Lymphs Abs: 760 cells/uL — ABNORMAL LOW (ref 850–3900)
MCH: 31.3 pg (ref 27.0–33.0)
MCHC: 34 g/dL (ref 32.0–36.0)
MCV: 92.1 fL (ref 80.0–100.0)
MPV: 9.4 fL (ref 7.5–12.5)
Monocytes Relative: 13 %
Neutro Abs: 900 cells/uL — ABNORMAL LOW (ref 1500–7800)
Neutrophils Relative %: 45 %
Platelets: 251 10*3/uL (ref 140–400)
RBC: 4.28 10*6/uL (ref 3.80–5.10)
RDW: 13 % (ref 11.0–15.0)
Total Lymphocyte: 38 %
WBC: 2 10*3/uL — ABNORMAL LOW (ref 3.8–10.8)

## 2020-07-12 LAB — COMPLETE METABOLIC PANEL WITH GFR
AG Ratio: 1.5 (calc) (ref 1.0–2.5)
ALT: 17 U/L (ref 6–29)
AST: 20 U/L (ref 10–30)
Albumin: 4.4 g/dL (ref 3.6–5.1)
Alkaline phosphatase (APISO): 65 U/L (ref 31–125)
BUN: 10 mg/dL (ref 7–25)
CO2: 25 mmol/L (ref 20–32)
Calcium: 9.3 mg/dL (ref 8.6–10.2)
Chloride: 103 mmol/L (ref 98–110)
Creat: 0.68 mg/dL (ref 0.50–1.10)
GFR, Est African American: 125 mL/min/{1.73_m2} (ref >=60)
GFR, Est Non African American: 108 mL/min/{1.73_m2} (ref >=60)
Globulin: 3 g/dL (calc) (ref 1.9–3.7)
Glucose, Bld: 100 mg/dL — ABNORMAL HIGH (ref 65–99)
Potassium: 4.9 mmol/L (ref 3.5–5.3)
Sodium: 138 mmol/L (ref 135–146)
Total Bilirubin: 0.4 mg/dL (ref 0.2–1.2)
Total Protein: 7.4 g/dL (ref 6.1–8.1)

## 2020-07-12 LAB — SEDIMENTATION RATE: Sed Rate: 6 mm/h (ref 0–20)

## 2020-07-12 LAB — C3 AND C4
C3 Complement: 100 mg/dL (ref 83–193)
C4 Complement: 34 mg/dL (ref 15–57)

## 2020-07-12 LAB — ANTI-DNA ANTIBODY, DOUBLE-STRANDED: ds DNA Ab: <1 IU/mL

## 2020-07-12 NOTE — Progress Notes (Signed)
WBC count is low at 2.0.  CMP, sed rate, C3-C4 normal.  dsDNA and UA are pending.  Repeat CBC in 1 month.

## 2020-07-25 ENCOUNTER — Encounter: Payer: Self-pay | Admitting: Family Medicine

## 2020-07-25 ENCOUNTER — Other Ambulatory Visit: Payer: Self-pay

## 2020-07-25 ENCOUNTER — Other Ambulatory Visit (HOSPITAL_COMMUNITY)
Admission: RE | Admit: 2020-07-25 | Discharge: 2020-07-25 | Disposition: A | Discharge status: Home / Self Care | Payer: 59 | Source: Ambulatory Visit | Attending: Family Medicine | Admitting: Family Medicine

## 2020-07-25 ENCOUNTER — Ambulatory Visit (INDEPENDENT_AMBULATORY_CARE_PROVIDER_SITE_OTHER): Payer: 59 | Admitting: Family Medicine

## 2020-07-25 VITALS — BP 128/86 | HR 87 | Temp 97.8°F | Ht 61.0 in | Wt 154.2 lb

## 2020-07-25 DIAGNOSIS — Z0000 Encounter for general adult medical examination without abnormal findings: Secondary | ICD-10-CM | POA: Diagnosis not present

## 2020-07-25 DIAGNOSIS — Z1159 Encounter for screening for other viral diseases: Secondary | ICD-10-CM

## 2020-07-25 DIAGNOSIS — F321 Major depressive disorder, single episode, moderate: Secondary | ICD-10-CM

## 2020-07-25 DIAGNOSIS — Z23 Encounter for immunization: Secondary | ICD-10-CM | POA: Diagnosis not present

## 2020-07-25 DIAGNOSIS — Z124 Encounter for screening for malignant neoplasm of cervix: Secondary | ICD-10-CM | POA: Diagnosis not present

## 2020-07-25 LAB — COMPREHENSIVE METABOLIC PANEL
ALT: 21 U/L (ref 0–35)
AST: 21 U/L (ref 0–37)
Albumin: 4.6 g/dL (ref 3.5–5.2)
Alkaline Phosphatase: 76 U/L (ref 39–117)
BUN: 12 mg/dL (ref 6–23)
CO2: 27 mEq/L (ref 19–32)
Calcium: 9.5 mg/dL (ref 8.4–10.5)
Chloride: 103 mEq/L (ref 96–112)
Creatinine, Ser: 0.82 mg/dL (ref 0.40–1.20)
GFR: 88.45 mL/min (ref >60.00)
Glucose, Bld: 89 mg/dL (ref 70–99)
Potassium: 4.2 mEq/L (ref 3.5–5.1)
Sodium: 135 mEq/L (ref 135–145)
Total Bilirubin: 0.5 mg/dL (ref 0.2–1.2)
Total Protein: 7.8 g/dL (ref 6.0–8.3)

## 2020-07-25 LAB — CBC WITH DIFFERENTIAL/PLATELET
Basophils Absolute: 0.1 10*3/uL (ref 0.0–0.1)
Basophils Relative: 1.3 % (ref 0.0–3.0)
Eosinophils Absolute: 0.1 10*3/uL (ref 0.0–0.7)
Eosinophils Relative: 3.1 % (ref 0.0–5.0)
HCT: 38.3 % (ref 36.0–46.0)
Hemoglobin: 13.1 g/dL (ref 12.0–15.0)
Lymphocytes Relative: 23.8 % (ref 12.0–46.0)
Lymphs Abs: 1 10*3/uL (ref 0.7–4.0)
MCHC: 34.2 g/dL (ref 30.0–36.0)
MCV: 92.2 fl (ref 78.0–100.0)
Monocytes Absolute: 0.5 10*3/uL (ref 0.1–1.0)
Monocytes Relative: 12.6 % — ABNORMAL HIGH (ref 3.0–12.0)
Neutro Abs: 2.4 10*3/uL (ref 1.4–7.7)
Neutrophils Relative %: 59.2 % (ref 43.0–77.0)
Platelets: 294 10*3/uL (ref 150.0–400.0)
RBC: 4.16 Mil/uL (ref 3.87–5.11)
RDW: 13.9 % (ref 11.5–15.5)
WBC: 4.1 10*3/uL (ref 4.0–10.5)

## 2020-07-25 LAB — LIPID PANEL
Cholesterol: 157 mg/dL (ref 0–200)
HDL: 60.9 mg/dL (ref >39.00)
LDL Cholesterol: 73 mg/dL (ref 0–99)
NonHDL: 95.73
Total CHOL/HDL Ratio: 3
Triglycerides: 113 mg/dL (ref 0.0–149.0)
VLDL: 22.6 mg/dL (ref 0.0–40.0)

## 2020-07-25 LAB — TSH: TSH: 2.98 u[IU]/mL (ref 0.35–4.50)

## 2020-07-25 NOTE — Progress Notes (Signed)
Patient: [name] MRN: [id number] DOB: [birth year] PCP: Orland Mustard, MD     Subjective: translator present.   Chief Complaint  Patient presents with  . Annual Exam  . Gynecologic Exam  . Depression    HPI: The patient is a 45 y.o. female who presents today for annual exam. She denies any changes to past medical history. There have been no recent hospitalizations. They are not following a well balanced diet and exercise plan. Weight has been fluctuating a bit. Pt is requesting information about the Covid vaccine. She is also here for her pap smear. She is needing a flu shot.   She has no first degree of breast or colon cancer. Her paternal grandmother had breast cancer.   Depression She is currently on cymbalta. She stopped and started to feel worse and when she started to take it again she felt better. She is no longer crying or sad. She thinks it is helping her. We had her on prozac, but then changed her over to cymbalta to possible help with pain. She does think medication is helping.   Gyn exam She was 16 when she started her periods. She originally had abnormal periods, but they are normal now. Periods last 4 days. She does have cramping and heavy periods. She has hx of CIN 2 s/p LEEP about 6 years ago and paps have been normal. Last pap was in 2019 and was wnl. Negative hpv. No hx of other STDs. She has no vaginal complaints. Denies any pain with sex, vaginal discharge. She is sexually active with one person. She is in relationship with other another woman. She had her mammogram in march 2021 and was normal.    Immunization History  Administered Date(s) Administered  . Influenza,inj,Quad PF,6+ Mos 06/24/2018, 04/08/2019, 07/25/2020  . Pneumococcal Polysaccharide-23 03/25/2018  . Tdap 11/23/2014   Colonoscopy: routine screening  Mammogram: utd. Due in march 2022.  Pap smear: today    Review of Systems  Constitutional: Negative for chills, fatigue and fever.   HENT: Negative for dental problem, ear pain, hearing loss and trouble swallowing.   Eyes: Negative for visual disturbance.  Respiratory: Negative for cough, chest tightness and shortness of breath.   Cardiovascular: Negative for chest pain, palpitations and leg swelling.  Gastrointestinal: Negative for abdominal pain, blood in stool, diarrhea and nausea.  Endocrine: Negative for cold intolerance, polydipsia, polyphagia and polyuria.  Genitourinary: Negative for dysuria, frequency, hematuria and pelvic pain.  Musculoskeletal: Negative for arthralgias.  Skin: Negative for rash.  Neurological: Negative for dizziness and headaches.  Psychiatric/Behavioral: Negative for dysphoric mood and sleep disturbance. The patient is not nervous/anxious.     Allergies Patient has No Known Allergies.  Past Medical History Patient  has a past medical history of Abnormal uterine bleeding, History of chicken pox, Urinary tract infection (12/2014), and Vaginal Pap smear, abnormal.  Surgical History Patient  has a past surgical history that includes Cesarean section and LEEP.  Family History Pateint's family history includes Breast cancer in her paternal grandmother; Diabetes in her mother; Healthy in her brother, brother, sister, son, son, and son; High Cholesterol in her mother; Hypertension in her mother.  Social History Patient  reports that she has been smoking cigarettes. She has never used smokeless tobacco. She reports current alcohol use of about 2.0 standard drinks of alcohol per week. She reports that she does not use drugs.    Objective: Vitals:   07/25/20 1054  BP: 128/86  Pulse: 87  Temp: 97.8 F (  36.6 C)  TempSrc: Temporal  SpO2: 92%  Weight: 154 lb 3.2 oz (69.9 kg)  Height: 5\' 1"  (1.549 m)    Body mass index is 29.14 kg/m.  Physical Exam Vitals reviewed. Exam conducted with a chaperone present.  Constitutional:      Appearance: Normal appearance. She is well-developed, normal  weight and well-nourished.  HENT:     Head: Normocephalic and atraumatic.     Right Ear: Tympanic membrane, ear canal and external ear normal.     Left Ear: Tympanic membrane, ear canal and external ear normal.     Nose: Nose normal.     Mouth/Throat:     Mouth: Oropharynx is clear and moist. Mucous membranes are moist.  Eyes:     Extraocular Movements: Extraocular movements intact and EOM normal.     Conjunctiva/sclera: Conjunctivae normal.     Pupils: Pupils are equal, round, and reactive to light.  Neck:     Thyroid: No thyromegaly.     Vascular: No carotid bruit.  Cardiovascular:     Rate and Rhythm: Normal rate and regular rhythm.     Pulses: Normal pulses and intact distal pulses.     Heart sounds: Normal heart sounds. No murmur heard.   Pulmonary:     Effort: Pulmonary effort is normal.     Breath sounds: Normal breath sounds.  Abdominal:     General: Abdomen is flat. Bowel sounds are normal. There is no distension.     Palpations: Abdomen is soft.     Tenderness: There is no abdominal tenderness.  Genitourinary:    General: Normal vulva.     Vagina: Normal. No vaginal discharge.     Cervix: Normal.     Uterus: Normal.      Adnexa: Right adnexa normal and left adnexa normal.     Rectum: Normal.  Musculoskeletal:     Cervical back: Normal range of motion and neck supple.  Lymphadenopathy:     Cervical: No cervical adenopathy.  Skin:    General: Skin is warm and dry.     Capillary Refill: Capillary refill takes less than 2 seconds.     Findings: No rash.  Neurological:     General: No focal deficit present.     Mental Status: She is alert and oriented to person, place, and time.     Cranial Nerves: No cranial nerve deficit.     Coordination: Coordination normal.     Deep Tendon Reflexes: Reflexes normal.  Psychiatric:        Mood and Affect: Mood and affect and mood normal.        Behavior: Behavior normal.    Flowsheet Row Office Visit from 07/25/2020 in  [facility]  PHQ-9 Total Score 3          Assessment/plan: 1. Annual physical exam HM reviewed. covid vaccine discussed and where to get. [facility] number also given. Flu shot today. Routine labs and pap smear today. HM otherwise up to date. Encouraged exercise. F/u in one year.  Patient counseling [x]    Nutrition: Stressed importance of moderation in sodium/caffeine intake, saturated fat and cholesterol, caloric balance, sufficient intake of fresh fruits, vegetables, fiber, calcium, iron, and 1 mg of folate supplement per day (for females capable of pregnancy).  [x]    Stressed the importance of regular exercise.   []    Substance Abuse: Discussed cessation/primary prevention of tobacco, alcohol, or other drug use; driving or other dangerous activities under the influence; availability of  treatment for abuse.   [x]    Injury prevention: Discussed safety belts, safety helmets, smoke detector, smoking near bedding or upholstery.   [x]    Sexuality: Discussed sexually transmitted diseases, partner selection, use of condoms, avoidance of unintended pregnancy  and contraceptive alternatives.  [x]    Dental health: Discussed importance of regular tooth brushing, flossing, and dental visits.  [x]    Health maintenance and immunizations reviewed. Please refer to Health maintenance section.    - CBC with Differential/Platelet - Comprehensive metabolic panel - Lipid panel - TSH  2. Depression, major, single episode, moderate (HCC) phq9 score significantly improved even from when on prozac. Would continue medication. Encouraged exercise as well. No refills needed at this time.   3. Cervical cancer screening Hx of cin2 with leep over 6 years ago. Last few paps have been normal. Pap +HPV today.  - Cytology - PAP( [facility])  4. Encounter for hepatitis C screening test for low risk patient  - Hepatitis C antibody  5. Flu shot today.   This visit occurred during the SARS-CoV-2  public health emergency.  Safety protocols were in place, including screening questions prior to the visit, additional usage of staff PPE, and extensive cleaning of exam room while observing appropriate contact time as indicated for disinfecting solutions.     Return in about 1 year (around 07/25/2021) for annual or as needed .     Orland Mustard, MD [facility]  07/25/2020

## 2020-07-25 NOTE — Patient Instructions (Signed)
Preventive Care 84-43 Years Old, Female Preventive care refers to lifestyle choices and visits with your health care provider that can promote health and wellness. This includes:  A yearly physical exam. This is also called an annual wellness visit.  Regular dental and eye exams.  Immunizations.  Screening for certain conditions.  Healthy lifestyle choices, such as: ? Eating a healthy diet. ? Getting regular exercise. ? Not using drugs or products that contain nicotine and tobacco. ? Limiting alcohol use. What can I expect for my preventive care visit? Physical exam Your health care provider will check your:  Height and weight. These may be used to calculate your BMI (body mass index). BMI is a measurement that tells if you are at a healthy weight.  Heart rate and blood pressure.  Body temperature.  Skin for abnormal spots. Counseling Your health care provider may ask you questions about your:  Past medical problems.  Family's medical history.  Alcohol, tobacco, and drug use.  Emotional well-being.  Home life and relationship well-being.  Sexual activity.  Diet, exercise, and sleep habits.  Work and work Statistician.  Access to firearms.  Method of birth control.  Menstrual cycle.  Pregnancy history. What immunizations do I need? Vaccines are usually given at various ages, according to a schedule. Your health care provider will recommend vaccines for you based on your age, medical history, and lifestyle or other factors, such as travel or where you work.   What tests do I need? Blood tests  Lipid and cholesterol levels. These may be checked every 5 years, or more often if you are over 3 years old.  Hepatitis C test.  Hepatitis B test. Screening  Lung cancer screening. You may have this screening every year starting at age 73 if you have a 30-pack-year history of smoking and currently smoke or have quit within the past 15 years.  Colorectal cancer  screening. ? All adults should have this screening starting at age 52 and continuing until age 17. ? Your health care provider may recommend screening at age 49 if you are at increased risk. ? You will have tests every 1-10 years, depending on your results and the type of screening test.  Diabetes screening. ? This is done by checking your blood sugar (glucose) after you have not eaten for a while (fasting). ? You may have this done every 1-3 years.  Mammogram. ? This may be done every 1-2 years. ? Talk with your health care provider about when you should start having regular mammograms. This may depend on whether you have a family history of breast cancer.  BRCA-related cancer screening. This may be done if you have a family history of breast, ovarian, tubal, or peritoneal cancers.  Pelvic exam and Pap test. ? This may be done every 3 years starting at age 10. ? Starting at age 11, this may be done every 5 years if you have a Pap test in combination with an HPV test. Other tests  STD (sexually transmitted disease) testing, if you are at risk.  Bone density scan. This is done to screen for osteoporosis. You may have this scan if you are at high risk for osteoporosis. Talk with your health care provider about your test results, treatment options, and if necessary, the need for more tests. Follow these instructions at home: Eating and drinking  Eat a diet that includes fresh fruits and vegetables, whole grains, lean protein, and low-fat dairy products.  Take vitamin and mineral supplements  as recommended by your health care provider.  Do not drink alcohol if: ? Your health care provider tells you not to drink. ? You are pregnant, may be pregnant, or are planning to become pregnant.  If you drink alcohol: ? Limit how much you have to 0-1 drink a day. ? Be aware of how much alcohol is in your drink. In the [place] one drink equals one 12 oz bottle of beer (355 mL), one 5 oz glass of  wine (148 mL), or one 1 oz glass of hard liquor (44 mL).   Lifestyle  Take daily care of your teeth and gums. Brush your teeth every morning and night with fluoride toothpaste. Floss one time each day.  Stay active. Exercise for at least 30 minutes 5 or more days each week.  Do not use any products that contain nicotine or tobacco, such as cigarettes, e-cigarettes, and chewing tobacco. If you need help quitting, ask your health care provider.  Do not use drugs.  If you are sexually active, practice safe sex. Use a condom or other form of protection to prevent STIs (sexually transmitted infections).  If you do not wish to become pregnant, use a form of birth control. If you plan to become pregnant, see your health care provider for a prepregnancy visit.  If told by your health care provider, take low-dose aspirin daily starting at age 50.  Find healthy ways to cope with stress, such as: ? Meditation, yoga, or listening to music. ? Journaling. ? Talking to a trusted person. ? Spending time with friends and family. Safety  Always wear your seat belt while driving or riding in a vehicle.  Do not drive: ? If you have been drinking alcohol. Do not ride with someone who has been drinking. ? When you are tired or distracted. ? While texting.  Wear a helmet and other protective equipment during sports activities.  If you have firearms in your house, make sure you follow all gun safety procedures. What's next?  Visit your health care provider once a year for an annual wellness visit.  Ask your health care provider how often you should have your eyes and teeth checked.  Stay up to date on all vaccines. This information is not intended to replace advice given to you by your health care provider. Make sure you discuss any questions you have with your health care provider. Document Revised: 03/27/2020 Document Reviewed: 03/04/2018 Elsevier Patient Education  2021 Elsevier Inc.  

## 2020-07-26 ENCOUNTER — Other Ambulatory Visit: Payer: Self-pay | Admitting: Family Medicine

## 2020-07-26 LAB — CYTOLOGY - PAP
Comment: NEGATIVE
Diagnosis: NEGATIVE
High risk HPV: NEGATIVE

## 2020-07-26 LAB — HEPATITIS C ANTIBODY
Hepatitis C Ab: NONREACTIVE
SIGNAL TO CUT-OFF: 0.01 (ref <1.00)

## 2020-08-09 ENCOUNTER — Emergency Department (HOSPITAL_COMMUNITY): Payer: 59

## 2020-08-09 ENCOUNTER — Emergency Department (HOSPITAL_COMMUNITY)
Admission: EM | Admit: 2020-08-09 | Discharge: 2020-08-10 | Disposition: A | Discharge status: Home / Self Care | Payer: 59 | Attending: Emergency Medicine | Admitting: Emergency Medicine

## 2020-08-09 ENCOUNTER — Encounter (HOSPITAL_COMMUNITY): Payer: Self-pay

## 2020-08-09 ENCOUNTER — Other Ambulatory Visit: Payer: Self-pay

## 2020-08-09 DIAGNOSIS — K2900 Acute gastritis without bleeding: Secondary | ICD-10-CM | POA: Diagnosis not present

## 2020-08-09 DIAGNOSIS — F17210 Nicotine dependence, cigarettes, uncomplicated: Secondary | ICD-10-CM | POA: Insufficient documentation

## 2020-08-09 DIAGNOSIS — R110 Nausea: Secondary | ICD-10-CM

## 2020-08-09 DIAGNOSIS — R1012 Left upper quadrant pain: Secondary | ICD-10-CM

## 2020-08-09 LAB — URINALYSIS, ROUTINE W REFLEX MICROSCOPIC
Bilirubin Urine: NEGATIVE
Glucose, UA: NEGATIVE mg/dL
Hgb urine dipstick: NEGATIVE
Ketones, ur: 5 mg/dL — AB
Leukocytes,Ua: NEGATIVE
Nitrite: NEGATIVE
Protein, ur: NEGATIVE mg/dL
Specific Gravity, Urine: 1.005 (ref 1.005–1.030)
pH: 6 (ref 5.0–8.0)

## 2020-08-09 LAB — CBC
HCT: 36.6 % (ref 36.0–46.0)
Hemoglobin: 12.2 g/dL (ref 12.0–15.0)
MCH: 31.9 pg (ref 26.0–34.0)
MCHC: 33.3 g/dL (ref 30.0–36.0)
MCV: 95.6 fL (ref 80.0–100.0)
Platelets: 333 10*3/uL (ref 150–400)
RBC: 3.83 MIL/uL — ABNORMAL LOW (ref 3.87–5.11)
RDW: 13.9 % (ref 11.5–15.5)
WBC: 3.5 10*3/uL — ABNORMAL LOW (ref 4.0–10.5)
nRBC: 0 % (ref 0.0–0.2)

## 2020-08-09 LAB — HCG, QUANTITATIVE, PREGNANCY: hCG, Beta Chain, Quant, S: 1 m[IU]/mL (ref <5)

## 2020-08-09 LAB — COMPREHENSIVE METABOLIC PANEL
ALT: 19 U/L (ref 0–44)
AST: 23 U/L (ref 15–41)
Albumin: 4.4 g/dL (ref 3.5–5.0)
Alkaline Phosphatase: 62 U/L (ref 38–126)
Anion gap: 12 (ref 5–15)
BUN: 9 mg/dL (ref 6–20)
CO2: 24 mmol/L (ref 22–32)
Calcium: 9.4 mg/dL (ref 8.9–10.3)
Chloride: 101 mmol/L (ref 98–111)
Creatinine, Ser: 0.78 mg/dL (ref 0.44–1.00)
GFR, Estimated: >60 mL/min (ref >60)
Glucose, Bld: 87 mg/dL (ref 70–99)
Potassium: 3.8 mmol/L (ref 3.5–5.1)
Sodium: 137 mmol/L (ref 135–145)
Total Bilirubin: 0.6 mg/dL (ref 0.3–1.2)
Total Protein: 7.9 g/dL (ref 6.5–8.1)

## 2020-08-09 LAB — LIPASE, BLOOD: Lipase: 27 U/L (ref 11–51)

## 2020-08-09 MED ORDER — SUCRALFATE 1 G PO TABS: 1.0000 g | ORAL_TABLET | Freq: Three times a day (TID) | ORAL | 0 refills | Status: DC

## 2020-08-09 MED ORDER — OMEPRAZOLE 20 MG PO CPDR: 20.0000 mg | DELAYED_RELEASE_CAPSULE | Freq: Every day | ORAL | 0 refills | Status: DC

## 2020-08-09 MED ORDER — ALUM & MAG HYDROXIDE-SIMETH 200-200-20 MG/5ML PO SUSP
30.0000 mL | Freq: Once | ORAL | Status: AC
  Administered 2020-08-09: 30 mL via ORAL
  Filled 2020-08-09: qty 30

## 2020-08-09 MED ORDER — PANTOPRAZOLE SODIUM 40 MG PO TBEC
40.0000 mg | DELAYED_RELEASE_TABLET | Freq: Every day | ORAL | Status: DC
  Administered 2020-08-09: 40 mg via ORAL
  Filled 2020-08-09: qty 1

## 2020-08-09 MED ORDER — SUCRALFATE 1 G PO TABS
1.0000 g | ORAL_TABLET | Freq: Once | ORAL | Status: AC
  Administered 2020-08-09: 1 g via ORAL
  Filled 2020-08-09: qty 1

## 2020-08-09 MED ORDER — LIDOCAINE VISCOUS HCL 2 % MT SOLN
15.0000 mL | Freq: Once | OROMUCOSAL | Status: AC
  Administered 2020-08-09: 15 mL via ORAL
  Filled 2020-08-09: qty 15

## 2020-08-09 MED ORDER — IOHEXOL 300 MG/ML  SOLN
100.0000 mL | Freq: Once | INTRAMUSCULAR | Status: AC | PRN
  Administered 2020-08-09: 100 mL via INTRAVENOUS

## 2020-08-09 NOTE — ED Provider Notes (Signed)
[facility]-EMERGENCY DEPT Provider Note   CSN: [id number] Arrival date & time: 08/09/20  1643     History Chief Complaint  Patient presents with  . Abdominal Pain    [name] is a 45 y.o. female.  The history is provided by the patient and medical records. No language interpreter was used.  Abdominal Pain Pain location:  L flank, LUQ and LLQ Pain quality: aching and cramping   Onset quality:  Gradual Duration:  3 days Timing:  Intermittent Progression:  Waxing and waning Chronicity:  New Context: eating   Context: not trauma   Relieved by:  Nothing Worsened by:  Eating and palpation Ineffective treatments:  None tried Associated symptoms: constipation, nausea and vaginal bleeding (on menstrual cycle)   Associated symptoms: no anorexia, no chest pain, no chills, no cough, no diarrhea, no dysuria, no fatigue, no fever, no melena, no shortness of breath and no vomiting   Risk factors: not pregnant        Past Medical History:  Diagnosis Date  . Abnormal uterine bleeding   . History of chicken pox   . Urinary tract infection 12/2014  . Vaginal Pap smear, abnormal     Patient Active Problem List   Diagnosis Date Noted  . Popliteal cyst, left 12/13/2019  . Depression, major, single episode, moderate (HCC) 07/25/2019  . Autoimmune disorder (HCC) 03/25/2018  . Neutropenia (HCC) 01/04/2018  . 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 CESAREAN SECTION    . LEEP       OB History    Gravida  5   Para  3   Term  3   Preterm      AB  2   Living  3     SAB  1   IAB  1   Ectopic      Multiple      Live Births              Family History  Problem Relation Age of Onset  . Diabetes Mother   . Hypertension Mother   . High Cholesterol Mother   . Breast cancer Paternal Grandmother   . Healthy Sister   . Healthy Brother   . Healthy Brother   .  Healthy Son   . Healthy Son   . Healthy Son     Social History   Tobacco Use  . Smoking status: Light Tobacco Smoker    Types: Cigarettes  . Smokeless tobacco: Never Used  . Tobacco comment: occ  Vaping Use  . Vaping Use: Never used  Substance Use Topics  . Alcohol use: Yes    Alcohol/week: 2.0 standard drinks    Types: 2 Cans of beer per week    Comment: occ  . Drug use: No    Home Medications Prior to Admission medications   Medication Sig Start Date End Date Taking? Authorizing Provider  DULoxetine (CYMBALTA) 60 MG capsule TAKE 1 CAPSULE(60 MG) BY MOUTH DAILY 07/26/20   Orland Mustard, MD  folic acid (FOLVITE) 1 MG tablet TAKE 1 TABLET BY MOUTH DAILY Patient not taking: No sig reported 03/31/19   Orland Mustard, MD  gabapentin (NEURONTIN) 300 MG capsule Take one pill am, noon and then 2 pills at night 09/05/19   Orland Mustard, MD  hydroxychloroquine (PLAQUENIL) 200 MG tablet Take 1 tablet BID  Every other day alternating with 1 tablet every other Monday-Friday  only. 07/11/20   Pollyann Savoy, MD  ibuprofen (ADVIL) 400 MG tablet Take 400 mg by mouth every 6 (six) hours as needed. Takes prn Patient not taking: Reported on 07/25/2020    [provider]  meloxicam (MOBIC) 15 MG tablet TAKE 1 TABLET BY MOUTH EVERY DAY Patient taking differently: daily as needed. 06/27/19   Orland Mustard, MD  Multiple Vitamin (MULTIVITAMIN) tablet Take 1 tablet by mouth daily.    [provider]  RESTASIS 0.05 % ophthalmic emulsion Place into both eyes daily.  03/31/18   [provider]  vitamin C (ASCORBIC ACID) 500 MG tablet Take 500 mg by mouth daily.    [provider]    Allergies    Patient has no known allergies.  Review of Systems   Review of Systems  Constitutional: Negative for chills, diaphoresis, fatigue and fever.  HENT: Negative for congestion.   Respiratory: Negative for cough, chest tightness, shortness of breath, wheezing and stridor.    Cardiovascular: Negative for chest pain, palpitations and leg swelling.  Gastrointestinal: Positive for abdominal pain, constipation and nausea. Negative for abdominal distention, anorexia, diarrhea, melena and vomiting.  Genitourinary: Positive for flank pain and vaginal bleeding (on menstrual cycle). Negative for dysuria, frequency, menstrual problem, pelvic pain and urgency.  Musculoskeletal: Positive for back pain. Negative for neck pain and neck stiffness.  Skin: Negative for rash and wound.  Neurological: Negative for weakness, light-headedness and numbness.  Psychiatric/Behavioral: Negative for agitation and confusion.  All other systems reviewed and are negative.   Physical Exam Updated Vital Signs BP (!) 131/92 (BP Location: Left Arm)   Pulse 94   Temp 98.3 F (36.8 C) (Oral)   Resp 18   Ht 5\' 1"  (1.549 m)   Wt 69.4 kg   LMP 07/11/2020 (Approximate)   SpO2 98%   BMI 28.91 kg/m   Physical Exam Vitals and nursing note reviewed.  Constitutional:      General: She is not in acute distress.    Appearance: She is well-developed and well-nourished. She is not ill-appearing, toxic-appearing or diaphoretic.  HENT:     Head: Normocephalic and atraumatic.     Right Ear: External ear normal.     Left Ear: External ear normal.     Nose: Nose normal.     Mouth/Throat:     Mouth: Oropharynx is clear and moist.  Eyes:     Extraocular Movements: EOM normal.     Conjunctiva/sclera: Conjunctivae normal.     Pupils: Pupils are equal, round, and reactive to light.  Cardiovascular:     Rate and Rhythm: Normal rate.     Heart sounds: Normal heart sounds. No murmur heard.   Pulmonary:     Effort: Pulmonary effort is normal. No respiratory distress.     Breath sounds: No stridor. No wheezing, rhonchi or rales.  Chest:     Chest wall: No tenderness.  Abdominal:     General: Abdomen is flat. Bowel sounds are normal. There is no distension.     Tenderness: There is abdominal  tenderness in the left upper quadrant and left lower quadrant. There is no right CVA tenderness, left CVA tenderness or rebound.  Genitourinary:    Adnexa:        Left: No mass.    Musculoskeletal:     Cervical back: Normal range of motion and neck supple.  Skin:    General: Skin is warm.     Capillary Refill: Capillary refill takes less than 2  seconds.     Coloration: Skin is not pale.     Findings: No erythema or rash.  Neurological:     General: No focal deficit present.     Mental Status: She is alert and oriented to person, place, and time.     Motor: No abnormal muscle tone.     Coordination: Coordination normal.     Deep Tendon Reflexes: Reflexes are normal and symmetric.  Psychiatric:        Mood and Affect: Mood normal.     ED Results / Procedures / Treatments   Labs (all labs ordered are listed, but only abnormal results are displayed) Labs Reviewed  CBC - Abnormal; Notable for the following components:      Result Value   WBC 3.5 (*)    RBC 3.83 (*)    All other components within normal limits  URINALYSIS, ROUTINE W REFLEX MICROSCOPIC - Abnormal; Notable for the following components:   Ketones, ur 5 (*)    All other components within normal limits  URINE CULTURE  LIPASE, BLOOD  COMPREHENSIVE METABOLIC PANEL  HCG, QUANTITATIVE, PREGNANCY  I-STAT BETA HCG BLOOD, ED ([facility], [facility], [facility] ONLY)    EKG None  Radiology CT ABDOMEN PELVIS W CONTRAST  Result Date: 08/09/2020 CLINICAL DATA:  45 year old female with abdominal pain. Concern for acute diverticulitis. EXAM: CT ABDOMEN AND PELVIS WITH CONTRAST TECHNIQUE: Multidetector CT imaging of the abdomen and pelvis was performed using the standard protocol following bolus administration of intravenous contrast. CONTRAST:  100mL OMNIPAQUE IOHEXOL 300 MG/ML  SOLN COMPARISON:  None. FINDINGS: Lower chest: The visualized lung bases are clear. No intra-abdominal free air or free fluid. Hepatobiliary: No focal liver abnormality is  seen. No gallstones, gallbladder wall thickening, or biliary dilatation. Pancreas: Unremarkable. No pancreatic ductal dilatation or surrounding inflammatory changes. Spleen: Normal in size without focal abnormality. Adrenals/Urinary Tract: Adrenal glands are unremarkable. Kidneys are normal, without renal calculi, focal lesion, or hydronephrosis. Bladder is unremarkable. Stomach/Bowel: There is moderate stool throughout the colon. There is no bowel obstruction or active inflammation. The appendix is normal. Vascular/Lymphatic: The abdominal aorta and IVC unremarkable. No portal venous gas. There is no adenopathy. Reproductive: The uterus is anteverted. Probable small anterior body fibroid. No adnexal masses. Other: None Musculoskeletal: No acute or significant osseous findings. IMPRESSION: No acute intra-abdominal or pelvic pathology. No bowel obstruction. Normal appendix. Electronically Signed   By: Elgie CollardArash  Radparvar M.D.   On: 08/09/2020 21:40    Procedures Procedures   Medications Ordered in ED Medications  pantoprazole (PROTONIX) EC tablet 40 mg (40 mg Oral Given 08/09/20 2342)  alum & mag hydroxide-simeth (MAALOX/MYLANTA) 200-200-20 MG/5ML suspension 30 mL (30 mLs Oral Given 08/09/20 2100)    And  lidocaine (XYLOCAINE) 2 % viscous mouth solution 15 mL (15 mLs Oral Given 08/09/20 2100)  iohexol (OMNIPAQUE) 300 MG/ML solution 100 mL (100 mLs Intravenous Contrast Given 08/09/20 2127)  sucralfate (CARAFATE) tablet 1 g (1 g Oral Given 08/09/20 2342)    ED Course  I have reviewed the triage vital signs and the nursing notes.  Pertinent labs & imaging results that were available during my care of the patient were reviewed by me and considered in my medical decision making (see chart for details).    MDM Rules/Calculators/A&P                          Molli KnockReyna Molina is a 45 y.o. female with a past medical history significant  for depression, prior UTI, autoimmune disorder, previous cervical  dysplasia status post LEEP who presents with left abdominal pain.  Patient reports that for the last 3 days, she has had severe waxing waning left-sided abdominal pain the left upper quadrant, left lower quadrant, going to her left flank.  She reports she has not had a bowel movement last 2 days which is different for her.  She reports nausea but no vomiting.  She denies any trauma.  She reports eating, and drinking make her left upper quadrant abdominal pain worse.  She denies any history of this.  She denies any falls.  She reports no significant urinary changes and she is on her menstrual cycle but it has been normal.  She denies any vaginal pain or vaginal discharge.  She denies any other complaints and describes the pain wax and wanes but is currently a 7 out of 10 in severity.  On exam, lungs are clear and chest is nontender.  Abdomen is tender in the left hemiabdomen but bowel sounds were appreciated.  No left CVA tenderness appreciated.  No midline back tenderness.  No rashes seen.  Exam otherwise unremarkable.  A Spanish interpreter was used for all conversation.  Patient had some screening lab work done in triage.  Patient is lipase not elevated and CBC and CMP were overall reassuring.  She is not pregnant.  We will get urinalysis however due to the bowel changes, left-sided abdominal pain, we will get a CT scan to look for diverticulitis, obstruction, or kidney stones.  She will be given GI cocktail initially given how it is worsened with eating and drinking to see if this is more gastric in nature.  I suspect gastritis clinically.  Anticipate reassessment discharge after work-up is completed.  11:23 PM Patient's work-up returned showing no evidence of obstruction, diverticulitis, kidney stones, appendicitis, or other abnormality.  She reports her GI cocktail significantly proved her symptoms.  I suspect gastritis is causing her symptoms.  Patient will begin prescription for Prilosec and  Carafate and will follow up with PCP and gastroenterology.  Patient agrees with plan of care and was discharged in good condition with improvement of symptoms.    Final Clinical Impression(s) / ED Diagnoses Final diagnoses:  Left upper quadrant abdominal pain  Acute gastritis, presence of bleeding unspecified, unspecified gastritis type  Nausea    Rx / DC Orders ED Discharge Orders         Ordered    sucralfate (CARAFATE) 1 g tablet  3 times daily with meals & bedtime        08/09/20 2354    omeprazole (PRILOSEC) 20 MG capsule  Daily        08/09/20 2354          Clinical Impression: 1. Left upper quadrant abdominal pain   2. Acute gastritis, presence of bleeding unspecified, unspecified gastritis type   3. Nausea     Disposition: Discharge  Condition: Good  I have discussed the results, Dx and Tx plan with the pt(& family if present). He/she/they expressed understanding and agree(s) with the plan. Discharge instructions discussed at great length. Strict return precautions discussed and pt &/or family have verbalized understanding of the instructions. No further questions at time of discharge.    Discharge Medication List as of 08/09/2020 11:55 PM    START taking these medications   Details  omeprazole (PRILOSEC) 20 MG capsule Take 1 capsule (20 mg total) by mouth daily., Starting Thu 08/09/2020, Print  sucralfate (CARAFATE) 1 g tablet Take 1 tablet (1 g total) by mouth 4 (four) times daily -  with meals and at bedtime., Starting Thu 08/09/2020, Print        Follow Up: Orland Mustard, MD [address] [place] [address] [phone]     Gastroenterology, Deboraha Sprang [address] [place] [place] [address] [phone]     [facility] [address] [id number] mc [address] [place] [address] [phone]       Kiersten Coss, Canary Brim, MD 08/10/20 [phone]

## 2020-08-09 NOTE — Discharge Instructions (Signed)
Your work-up today was overall reassuring and we suspect your symptoms are related to gastritis causing your pain with eating and drinking and otherwise reassuring work-up.  Please take the medication to help with the stomach acid and pain.  Your symptoms improved with a GI cocktail.  Please follow-up with gastroenterology and your PCP.  Please rest and stay hydrated.  If any symptoms change or worsen, please return to the nearest emergency department.

## 2020-08-09 NOTE — ED Notes (Signed)
POC HCG cancelled

## 2020-08-09 NOTE — ED Triage Notes (Addendum)
Patient reports that she began having mid and left abdominal pain with nausea that radiates into her mid back x 2 days. Patient states her abdomina cramps after eating or drinking. Patient states she has been taking antacids with no relief.

## 2020-08-11 LAB — URINE CULTURE: Culture: NO GROWTH

## 2020-08-30 ENCOUNTER — Other Ambulatory Visit: Payer: Self-pay | Admitting: Gastroenterology

## 2020-08-30 DIAGNOSIS — R110 Nausea: Secondary | ICD-10-CM

## 2020-08-30 DIAGNOSIS — R1013 Epigastric pain: Secondary | ICD-10-CM

## 2020-09-04 ENCOUNTER — Ambulatory Visit
Admission: RE | Admit: 2020-09-04 | Discharge: 2020-09-04 | Disposition: A | Discharge status: Home / Self Care | Payer: 59 | Source: Ambulatory Visit | Attending: Gastroenterology | Admitting: Gastroenterology

## 2020-09-04 DIAGNOSIS — R1013 Epigastric pain: Secondary | ICD-10-CM

## 2020-09-04 DIAGNOSIS — R110 Nausea: Secondary | ICD-10-CM

## 2020-09-14 IMAGING — US US BIOPSY LYMPH NODE
1 series · 13 of 16 positions shown · non-contrast
Comparison: none

INDICATION: 42-year-old female with left posterior cervical lymphadenopathy for
several months. The lymph node appears to be enlarging and is
painful. She presents for ultrasound-guided biopsy.

[Series 1: us biopsy lymph node · 13 of 16 slices shown]
[im 1/16]
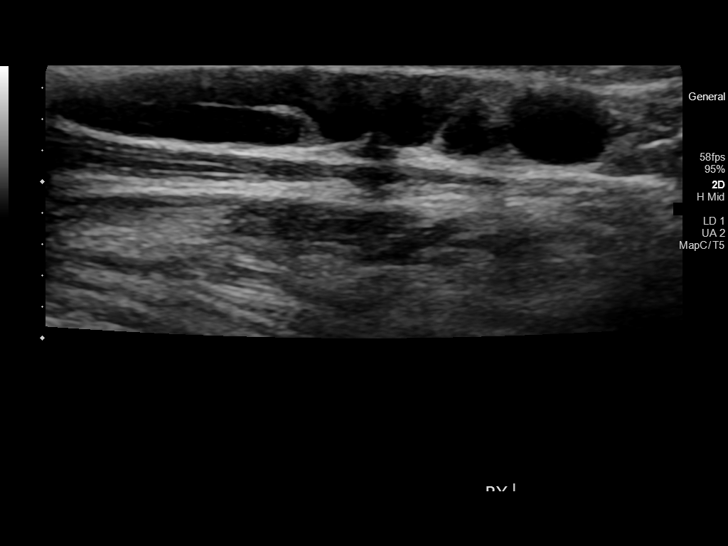
[im 2/16]
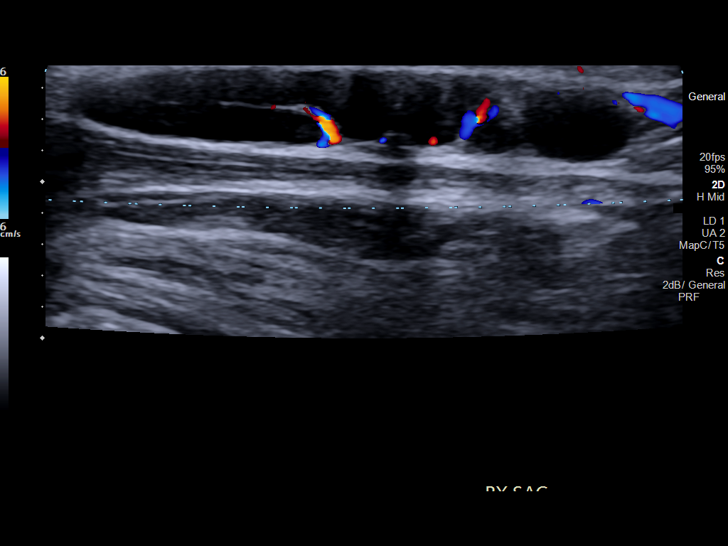
[im 4/16]
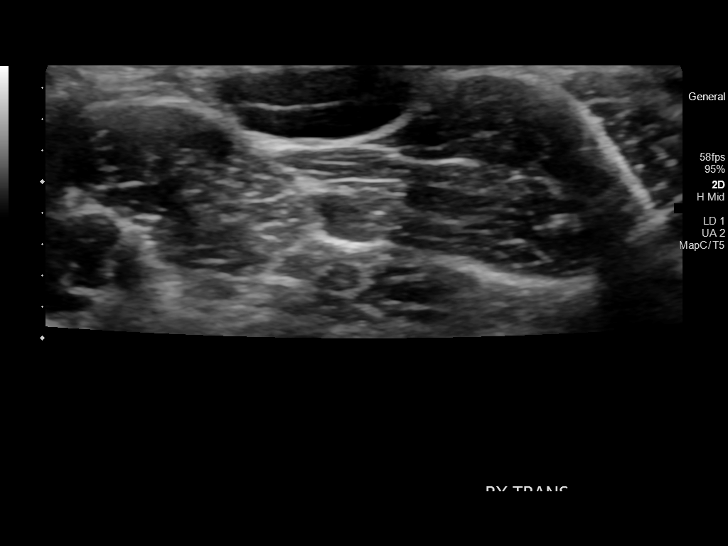
[im 5/16]
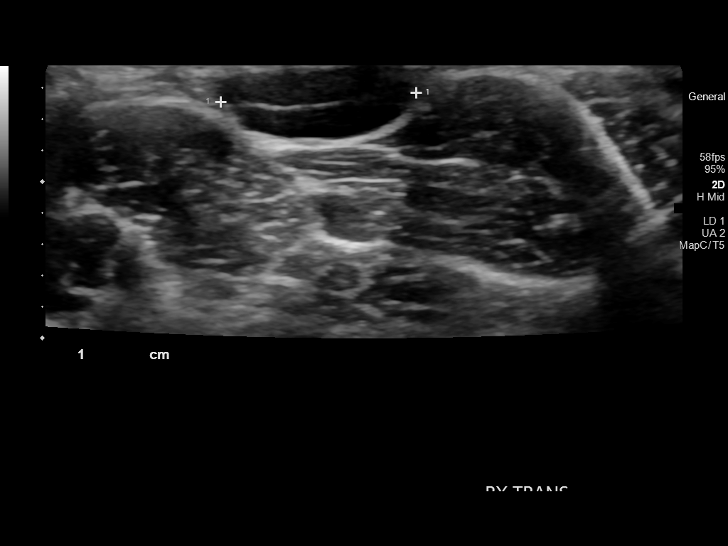
[im 6/16]
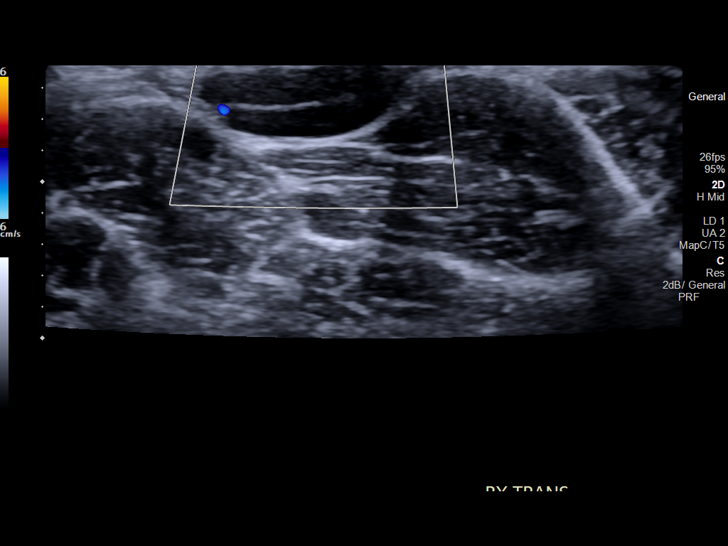
[im 7/16]
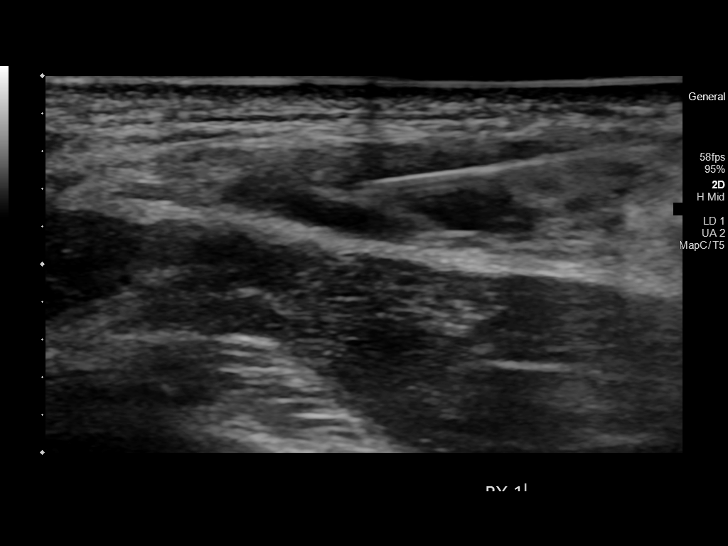
[im 9/16]
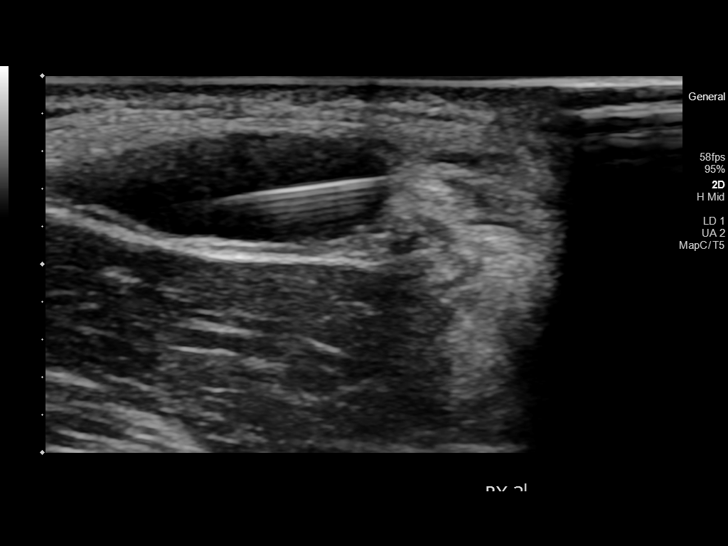
[im 10/16]
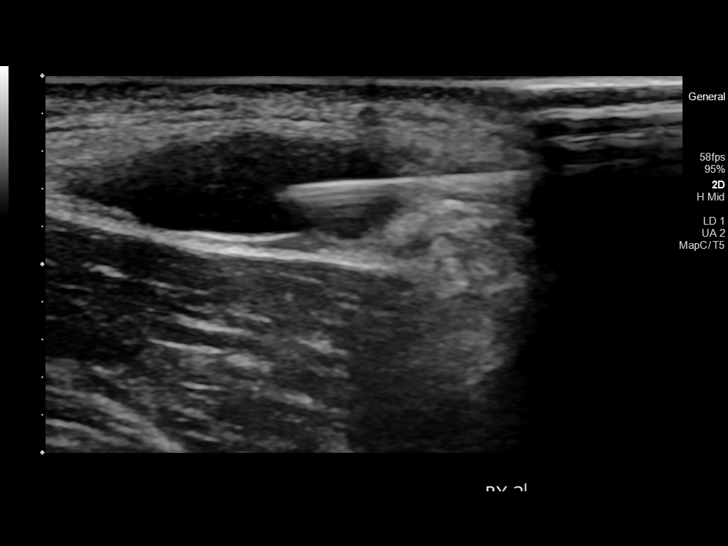
[im 11/16]
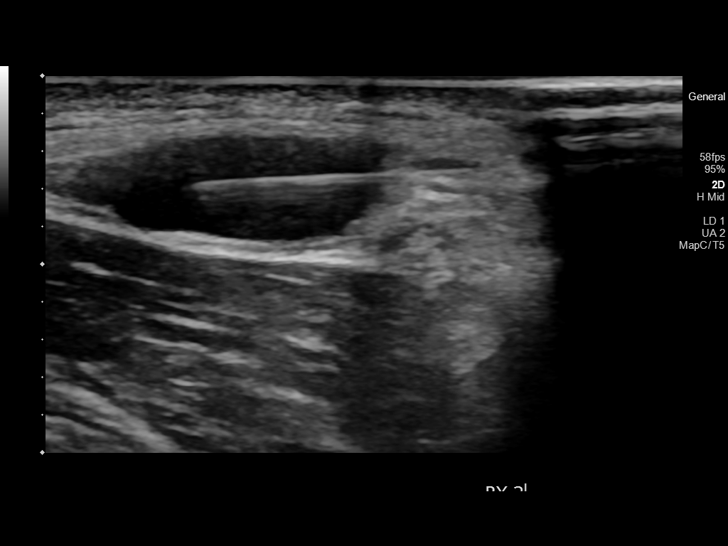
[im 12/16]
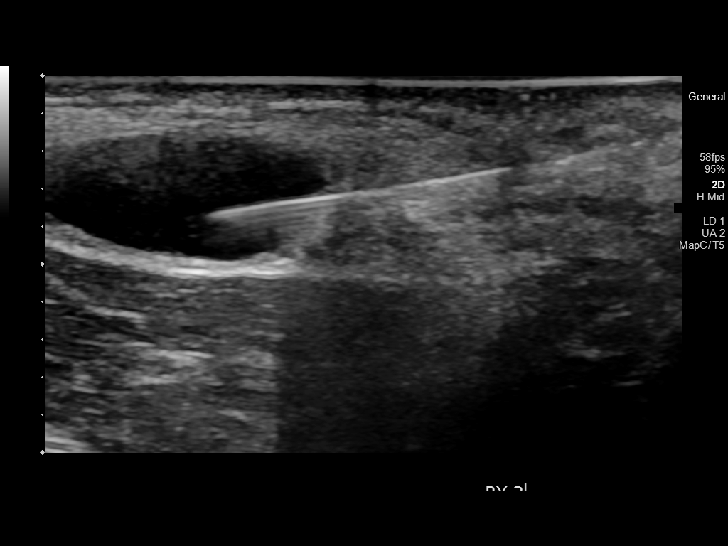
[im 13/16]
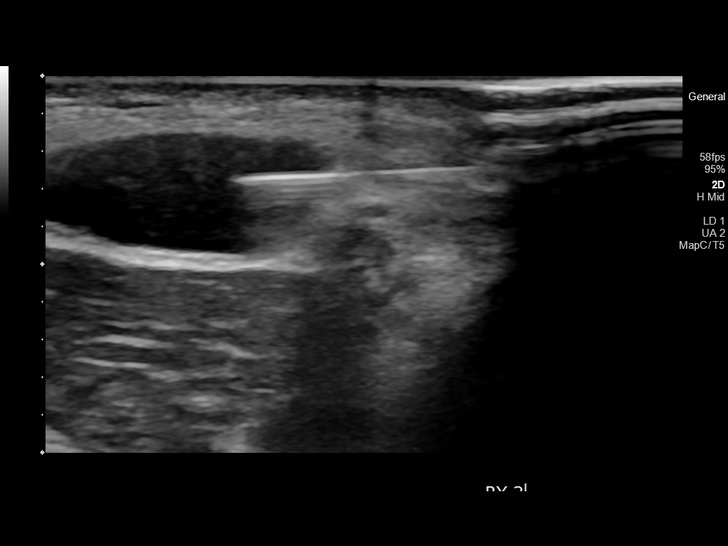
[im 15/16]
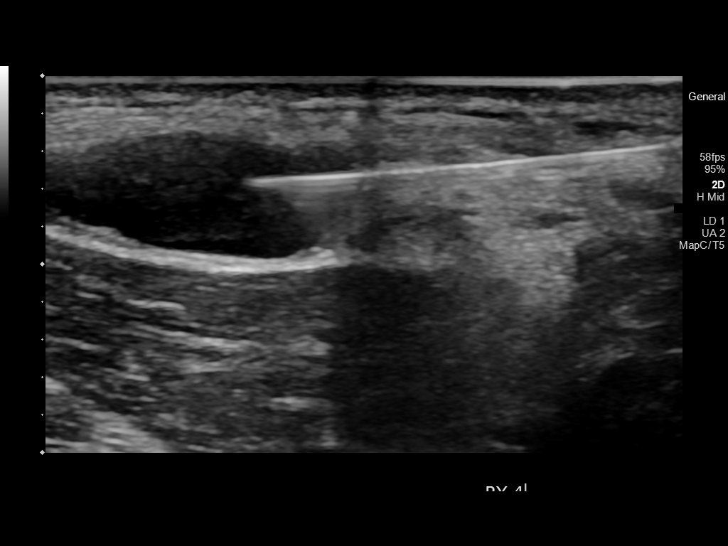
[im 16/16]
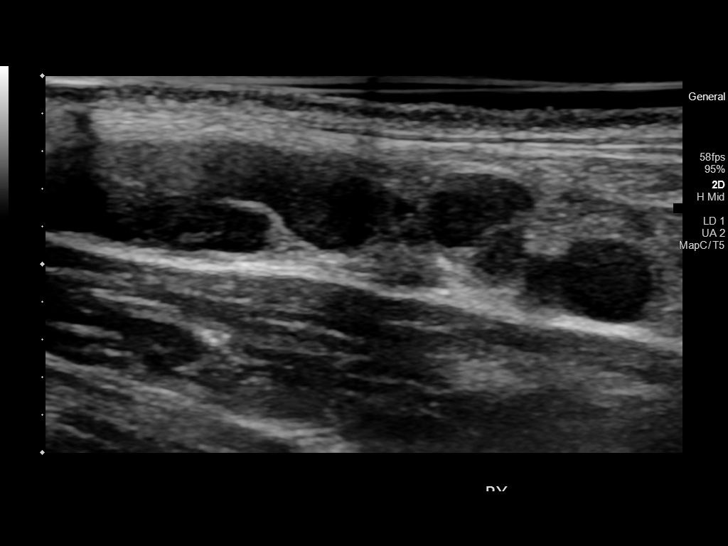

[13 of 16 positions shown; findings below may reference images not displayed]

EXAM:
Ultrasound-guided fine-needle aspiration biopsy of left posterior
cervical lymph node.

MEDICATIONS:
None.

ANESTHESIA/SEDATION:
Moderate (conscious) sedation was employed during this procedure. A
total of Versed 2 mg and Fentanyl 100 mcg was administered
intravenously.

Moderate Sedation Time: 22 minutes. The patient's level of
consciousness and vital signs were monitored continuously by
radiology nursing throughout the procedure under my direct
supervision.

FLUOROSCOPY TIME:  None

COMPLICATIONS:
None immediate.

PROCEDURE:
Informed written consent was obtained from the patient after a
thorough discussion of the procedural risks, benefits and
alternatives. All questions were addressed. Maximal Sterile Barrier
Technique was utilized including caps, mask, sterile gowns, sterile
gloves, sterile drape, hand hygiene and skin antiseptic. A timeout
was performed prior to the initiation of the procedure.

Ultrasound was used to interrogate the left posterior neck. There is
a superficial lymph node measuring 3.7 x 0.6 x 1.3 cm. The node is
only 0.6 cm in short axis. The node is also very superficial just
under the dermis.

A suitable skin entry site was selected and marked. Local anesthesia
was attained by infiltration with 1% lidocaine. A small dermatotomy
was made. Initial attempts were made to direct an 18 gauge core
biopsy device into the lymph node. However, the proximity of the
lymph node to the dermis made passage of the biopsy device
impossible. Therefore, the decision was made to proceed with
fine-needle aspiration biopsy.

Multiple fine-needle aspiration biopsies were then obtained using 25
gauge needles. The biopsy specimens were placed in saline and
delivered to pathology for cell block analysis. The patient
tolerated the procedure well.
IMPRESSION: 1. Unable to obtain core biopsy secondary to the extremely
superficial location of the lymph node.
2. Successful ultrasound-guided fine-needle aspiration biopsies
which were aggregated for cell block.

## 2020-10-10 ENCOUNTER — Other Ambulatory Visit: Payer: Self-pay

## 2020-10-10 ENCOUNTER — Ambulatory Visit (INDEPENDENT_AMBULATORY_CARE_PROVIDER_SITE_OTHER): Payer: 59 | Admitting: Physician Assistant

## 2020-10-10 ENCOUNTER — Encounter: Payer: Self-pay | Admitting: Physician Assistant

## 2020-10-10 VITALS — BP 121/84 | HR 96 | Resp 14 | Ht 61.0 in | Wt 154.4 lb

## 2020-10-10 DIAGNOSIS — R5383 Other fatigue: Secondary | ICD-10-CM

## 2020-10-10 DIAGNOSIS — Z862 Personal history of diseases of the blood and blood-forming organs and certain disorders involving the immune mechanism: Secondary | ICD-10-CM

## 2020-10-10 DIAGNOSIS — M170 Bilateral primary osteoarthritis of knee: Secondary | ICD-10-CM | POA: Diagnosis not present

## 2020-10-10 DIAGNOSIS — M19071 Primary osteoarthritis, right ankle and foot: Secondary | ICD-10-CM

## 2020-10-10 DIAGNOSIS — Z79899 Other long term (current) drug therapy: Secondary | ICD-10-CM

## 2020-10-10 DIAGNOSIS — M3509 Sicca syndrome with other organ involvement: Secondary | ICD-10-CM | POA: Diagnosis not present

## 2020-10-10 DIAGNOSIS — Z789 Other specified health status: Secondary | ICD-10-CM

## 2020-10-10 DIAGNOSIS — M19072 Primary osteoarthritis, left ankle and foot: Secondary | ICD-10-CM

## 2020-10-10 NOTE — Patient Instructions (Signed)
Standing Labs We placed an order today for your standing lab work.   Please have your standing labs drawn in July  If possible, please have your labs drawn 2 weeks prior to your appointment so that the provider can discuss your results at your appointment.  We have open lab daily Monday through Thursday from 1:30-4:30 PM and Friday from 1:30-4:00 PM at the office of Dr. Shaili Deveshwar, [facility].   Please be advised, all patients with office appointments requiring lab work will take precedents over walk-in lab work.  If possible, please come for your lab work on Monday and Friday afternoons, as you may experience shorter wait times. The office is located at [address], [address], [place], [place] [address] No appointment is necessary.   Labs are drawn by [facility]. Please bring your co-pay at the time of your lab draw.  You may receive a bill from [facility] for your lab work.  If you wish to have your labs drawn at another location, please call the office 24 hours in advance to send orders.  If you have any questions regarding directions or hours of operation,  please call [phone].   As a reminder, please drink plenty of water prior to coming for your lab work. Thanks!  

## 2020-10-10 NOTE — Progress Notes (Signed)
Office Visit Note  Patient: [name]             Date of Birth: January 15, 1976           MRN: [id number]             PCP: Orland Mustard, MD Referring: Orland Mustard, MD Visit Date: 10/10/2020 Occupation: @GUAROCC @  Interpreter: Thomasene Mohair  Subjective:  Medication monitoring   History of Present Illness: [name] is a 45 y.o. female with history of sjogren's syndrome.  She is taking Plaquenil 200 mg 1 tablet by mouth twice daily Monday through Friday.  She continues to tolerate Plaquenil without any side effects.  She denies any recent flares.  She states that she has noticed significant clinical improvement on this dose of Plaquenil.  Her energy level has improved significantly and her joint pain has improved.  She experiences intermittent pain and stiffness in her hands but denies any joint swelling at this time.  She had intermittent symptoms of Raynaud's with cooler weather temperatures but her symptoms have become less frequent as the weather is warmed up.  She has ongoing sicca symptoms.  She has been using Biotene mouth rinse on a daily basis and occasionally experiences peeling of the oral mucosa after using the rinse.  She has been using Sensodyne toothpaste on a daily basis as recommended by her dentist.  She has occasional oral ulcerations but denies any nasal ulcerations.  She has not had any recent rashes or increased photosensitivity.  She denies any increased hair loss.  She denies any chest pain, shortness of breath, or palpitations. She denies any infections recently.   Activities of Daily Living:  Patient reports morning stiffness for 0 minutes.   Patient Reports nocturnal pain.  Difficulty dressing/grooming: Denies Difficulty climbing stairs: Denies Difficulty getting out of chair: Denies Difficulty using hands for taps, buttons, cutlery, and/or writing: Reports  Review of Systems  Constitutional: Negative for fatigue.  HENT: Positive  for mouth sores, mouth dryness and nose dryness.   Eyes: Positive for itching and dryness. Negative for pain and visual disturbance.  Respiratory: Negative for cough, hemoptysis, shortness of breath and difficulty breathing.   Cardiovascular: Negative for chest pain, palpitations, hypertension and swelling in legs/feet.  Gastrointestinal: Positive for constipation. Negative for blood in stool and diarrhea.  Endocrine: Negative for increased urination.  Genitourinary: Negative for difficulty urinating and painful urination.  Musculoskeletal: Negative for arthralgias, joint pain, joint swelling, myalgias, muscle weakness, morning stiffness, muscle tenderness and myalgias.  Skin: Positive for color change. Negative for pallor, rash, hair loss, nodules/bumps, redness, skin tightness, ulcers and sensitivity to sunlight.  Allergic/Immunologic: Negative for susceptible to infections.  Neurological: Positive for numbness and headaches. Negative for dizziness, memory loss and weakness.  Hematological: Positive for bruising/bleeding tendency. Negative for swollen glands.  Psychiatric/Behavioral: Negative for depressed mood, confusion and sleep disturbance. The patient is not nervous/anxious.     PMFS History:  Patient Active Problem List   Diagnosis Date Noted  . Popliteal cyst, left 12/13/2019  . Depression, major, single episode, moderate (HCC) 07/25/2019  . Autoimmune disorder (HCC) 03/25/2018  . Neutropenia (HCC) 01/04/2018  . 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 Abnormal uterine bleeding   . History of chicken pox   . Urinary tract infection 12/2014  . Vaginal Pap smear, abnormal     Family History  Problem Relation Age of Onset  . Diabetes Mother   .  Hypertension Mother   . High Cholesterol Mother   . Breast cancer Paternal Grandmother   . Healthy Sister   . Healthy Brother   . Healthy Brother   . Healthy Son   . Healthy Son    . Healthy Son    Past Surgical History:  Procedure Laterality Date  . CESAREAN SECTION    . LEEP     Social History   Social History Narrative  . Not on file   Immunization History  Administered Date(s) Administered  . Influenza,inj,Quad PF,6+ Mos 06/24/2018, 04/08/2019, 07/25/2020  . PFIZER(Purple Top)SARS-COV-2 Vaccination 07/31/2020, 08/21/2020  . Pneumococcal Polysaccharide-23 03/25/2018  . Tdap 11/23/2014     Objective: Vital Signs: BP 121/84 (BP Location: Left Arm, Patient Position: Sitting, Cuff Size: Normal)   Pulse 96   Resp 14   Ht 5\' 1"  (1.549 m)   Wt 154 lb 6.4 oz (70 kg)   BMI 29.17 kg/m    Physical Exam Vitals and nursing note reviewed.  Constitutional:      Appearance: She is well-developed.  HENT:     Head: Normocephalic and atraumatic.  Eyes:     Conjunctiva/sclera: Conjunctivae normal.  Pulmonary:     Effort: Pulmonary effort is normal.  Abdominal:     Palpations: Abdomen is soft.  Musculoskeletal:     Cervical back: Normal range of motion.  Skin:    General: Skin is warm and dry.     Capillary Refill: Capillary refill takes less than 2 seconds.  Neurological:     Mental Status: She is alert and oriented to person, place, and time.  Psychiatric:        Behavior: Behavior normal.      Musculoskeletal Exam: C-spine, thoracic spine, and lumbar spine good ROM.  Shoulder joints, elbow joints, wrist joints, MCPs, PIPs, and DIPs good ROM with no synovitis.  Mild DIP thickening.  Complete fist formation bilaterally.  Hip joints have good range of motion with no discomfort.  Knee joints have good range of motion with no warmth or effusion.  Ankle joints have good range of motion with no tenderness or inflammation.  CDAI Exam: CDAI Score: -- Patient Global: --; Provider Global: -- Swollen: --; Tender: -- Joint Exam 10/10/2020   No joint exam has been documented for this visit   There is currently no information documented on the homunculus. Go  to the Rheumatology activity and complete the homunculus joint exam.  Investigation: No additional findings.  Imaging: No results found.  Recent Labs: Lab Results  Component Value Date   WBC 3.5 (L) 08/09/2020   HGB 12.2 08/09/2020   PLT 333 08/09/2020   NA 137 08/09/2020   K 3.8 08/09/2020   CL 101 08/09/2020   CO2 24 08/09/2020   GLUCOSE 87 08/09/2020   BUN 9 08/09/2020   CREATININE 0.78 08/09/2020   BILITOT 0.6 08/09/2020   ALKPHOS 62 08/09/2020   AST 23 08/09/2020   ALT 19 08/09/2020   PROT 7.9 08/09/2020   ALBUMIN 4.4 08/09/2020   CALCIUM 9.4 08/09/2020   GFRAA 125 07/11/2020    Speciality Comments: PLQ Eye Exam: 06/15/19 @ [facility] Follow up in 1 year  Procedures:  No procedures performed Allergies: Patient has no known allergies.   Assessment / Plan:     Visit Diagnoses:  Sjogren's syndrome with other organ involvement (HCC)-ANA 1:1280, Ro>8, Sicca sx, arthralgias, neutropenia, hypocomplementemia: She was sent for a second opinion at [facility] and had her initial consult on 06/04/20 (  note reviewed in care everywhere).  They agreed with the diagnosis of Sjogren's syndrome and did not feel that her disease was active.  They attributed her fatigue, brain fog, and myalgias to coexisting fibromyalgia.  They recommended continuing on Plaquenil as prescribed.  She has been taking Plaquenil 200 mg 1 tablet by mouth twice daily Monday through Friday and has been tolerating it without any side effects.  She has noticed a significant clinical improvement in her symptoms on this dose of Plaquenil.  Her energy level and arthralgias have improved.  She continues to have chronic sicca symptoms which have been manageable with over-the-counter products.   She has not had any oral or nasal ulcerations recently.  She has not had any recent rashes or photosensitivity.  She has intermittent symptoms of Raynaud's phenomenon but no digital ulcerations or signs of sclerodactyly was noted.   She will continue on Plaquenil as prescribed.  She was advised to notify us when she needs a refill.  She will be due to update lab work in July. Future orders placed today. We will check RF and SPEP on a yearly basis.  She was advised to notify us if she develops any new or worsening symptoms.   Plan: COMPLETE METABOLIC PANEL WITH GFR, CBC with Differential/Platelet, Urinalysis, Routine w reflex microscopic, Anti-DNA antibody, double-stranded, C3 and C4, Sedimentation rate  High risk medication use -Plaquenil 200 mg 1 tablet by mouth twice daily Monday through Friday only. CBC drawn on 08/09/20 and CMP WNL on 08/09/20. Plan: COMPLETE METABOLIC PANEL WITH GFR, CBC with Differential/Platelet She has not had any recent infections.   History of neutropenia: WBC count was 3.5 on 08/09/20.  Absolute neutrophils were WNL on 07/25/20.  We will continue to monitor lab work closely.    Primary osteoarthritis of both knees: She has good range of motion of both knee joints on exam.  No warmth or effusion was noted.  Primary osteoarthritis of both feet: She is not experiencing any discomfort in her feet at this time.   Other fatigue: Improved. Her energy level has improved significantly.   Language barrier: Interpreter: Thomasene Mohair  Orders: Orders Placed This Encounter  Procedures  . COMPLETE METABOLIC PANEL WITH GFR  . CBC with Differential/Platelet  . Urinalysis, Routine w reflex microscopic  . Anti-DNA antibody, double-stranded  . C3 and C4  . Sedimentation rate   No orders of the defined types were placed in this encounter.     Follow-Up Instructions: Return in about 5 months (around 03/12/2021) for Autoimmune Disease.   Gearldine Bienenstock, PA-C  Note - This record has been created using Dragon software.  Chart creation errors have been sought, but may not always  have been located. Such creation errors do not reflect on  the standard of medical care.

## 2020-11-14 ENCOUNTER — Other Ambulatory Visit: Payer: Self-pay | Admitting: Family Medicine

## 2020-11-14 NOTE — Progress Notes (Signed)
PLQ eye exam did not reveal any signs of toxicity on 08/16/20.

## 2021-02-26 ENCOUNTER — Other Ambulatory Visit: Payer: Self-pay | Admitting: Rheumatology

## 2021-02-26 DIAGNOSIS — M3509 Sicca syndrome with other organ involvement: Secondary | ICD-10-CM

## 2021-02-26 DIAGNOSIS — Z79899 Other long term (current) drug therapy: Secondary | ICD-10-CM

## 2021-02-26 NOTE — Telephone Encounter (Signed)
Next Visit: 03/19/2021  Last Visit: 10/10/2020  Labs: 08/09/2020 WBC 3.5, RBC 3.83  Eye exam: 08/16/2020   Current Dose per office note on 10/10/2020: Plaquenil 200 mg 1 tablet by mouth twice daily Monday through Friday only.  WU:[id number] syndrome with other organ involvement  Last Fill: 07/11/2020

## 2021-02-26 NOTE — Telephone Encounter (Signed)
Patient request refill on generic Plaquenil sent to [facility] on [facility].

## 2021-02-27 MED ORDER — HYDROXYCHLOROQUINE SULFATE 200 MG PO TABS: ORAL_TABLET | ORAL | 0 refills | Status: DC

## 2021-02-27 NOTE — Telephone Encounter (Signed)
Patient advised she is due to update labs. Patient states she will come today to update.   Okay to refill PLQ?

## 2021-02-27 NOTE — Telephone Encounter (Signed)
Okay to refill after patient comes in for the lab work.

## 2021-02-28 ENCOUNTER — Other Ambulatory Visit: Payer: Self-pay | Admitting: Physician Assistant

## 2021-02-28 LAB — COMPLETE METABOLIC PANEL WITH GFR
AG Ratio: 1.6 (calc) (ref 1.0–2.5)
ALT: 19 U/L (ref 6–29)
AST: 24 U/L (ref 10–30)
Albumin: 4.7 g/dL (ref 3.6–5.1)
Alkaline phosphatase (APISO): 77 U/L (ref 31–125)
BUN: 13 mg/dL (ref 7–25)
CO2: 29 mmol/L (ref 20–32)
Calcium: 9.4 mg/dL (ref 8.6–10.2)
Chloride: 101 mmol/L (ref 98–110)
Creat: 0.7 mg/dL (ref 0.50–0.99)
Globulin: 2.9 g/dL (calc) (ref 1.9–3.7)
Glucose, Bld: 90 mg/dL (ref 65–99)
Potassium: 4.1 mmol/L (ref 3.5–5.3)
Sodium: 135 mmol/L (ref 135–146)
Total Bilirubin: 0.4 mg/dL (ref 0.2–1.2)
Total Protein: 7.6 g/dL (ref 6.1–8.1)
eGFR: 111 mL/min/{1.73_m2} (ref >=60)

## 2021-02-28 LAB — CBC WITH DIFFERENTIAL/PLATELET
Absolute Monocytes: 475 cells/uL (ref 200–950)
Basophils Absolute: 61 cells/uL (ref 0–200)
Basophils Relative: 1.7 %
Eosinophils Absolute: 90 cells/uL (ref 15–500)
Eosinophils Relative: 2.5 %
HCT: 39.7 % (ref 35.0–45.0)
Hemoglobin: 13 g/dL (ref 11.7–15.5)
Lymphs Abs: 1022 cells/uL (ref 850–3900)
MCH: 31.6 pg (ref 27.0–33.0)
MCHC: 32.7 g/dL (ref 32.0–36.0)
MCV: 96.4 fL (ref 80.0–100.0)
MPV: 9.3 fL (ref 7.5–12.5)
Monocytes Relative: 13.2 %
Neutro Abs: 1951 cells/uL (ref 1500–7800)
Neutrophils Relative %: 54.2 %
Platelets: 303 10*3/uL (ref 140–400)
RBC: 4.12 10*6/uL (ref 3.80–5.10)
RDW: 13.1 % (ref 11.0–15.0)
Total Lymphocyte: 28.4 %
WBC: 3.6 10*3/uL — ABNORMAL LOW (ref 3.8–10.8)

## 2021-02-28 LAB — URINALYSIS, ROUTINE W REFLEX MICROSCOPIC
Bilirubin Urine: NEGATIVE
Glucose, UA: NEGATIVE
Hgb urine dipstick: NEGATIVE
Ketones, ur: NEGATIVE
Leukocytes,Ua: NEGATIVE
Nitrite: NEGATIVE
Protein, ur: NEGATIVE
Specific Gravity, Urine: 1.004 (ref 1.001–1.035)
pH: 6.5 (ref 5.0–8.0)

## 2021-02-28 LAB — C3 AND C4
C3 Complement: 100 mg/dL (ref 83–193)
C4 Complement: 23 mg/dL (ref 15–57)

## 2021-02-28 LAB — ANTI-DNA ANTIBODY, DOUBLE-STRANDED: ds DNA Ab: 1 IU/mL

## 2021-02-28 LAB — SEDIMENTATION RATE: Sed Rate: 6 mm/h (ref 0–20)

## 2021-02-28 NOTE — Progress Notes (Signed)
dsDNA is negative.  Labs are not consistent with a flare.  No change in therapy at this time.

## 2021-02-28 NOTE — Progress Notes (Signed)
WBC count is borderline low but stable-3.6. Rest of CBC WNL.  CMP WNL.  ESR and complements WNL.  UA normal.

## 2021-03-05 NOTE — Progress Notes (Signed)
Office Visit Note  Patient: [name]             Date of Birth: March 21, 1976           MRN: [id number]             PCP: Orland Mustard, MD Referring: Orland Mustard, MD Visit Date: 03/19/2021 Occupation: @GUAROCC @  Interpreter: Early Osmond   Subjective:  Other (Bilateral knee pain )   History of Present Illness: [name] is a 45 y.o. female with a history of Sjogren's and osteoarthritis.  She continues to have some dry mouth and dry eyes symptoms.  She also gives history of Raynaud's phenomenon when she is exposed to colder temperatures.  She continues to have some discomfort in the bilateral knee joints.  No warmth swelling or effusion was noted.  Activities of Daily Living:  Patient reports morning stiffness for less than 1 minute.   Patient Reports nocturnal pain.  Difficulty dressing/grooming: Denies Difficulty climbing stairs: Denies Difficulty getting out of chair: Denies Difficulty using hands for taps, buttons, cutlery, and/or writing: Reports  Review of Systems  Constitutional:  Positive for fatigue.  HENT:  Positive for mouth dryness. Negative for mouth sores and nose dryness.   Eyes:  Positive for pain, itching and dryness.  Respiratory:  Negative for shortness of breath and difficulty breathing.   Cardiovascular:  Negative for chest pain and palpitations.  Gastrointestinal:  Positive for constipation. Negative for blood in stool and diarrhea.  Endocrine: Negative for increased urination.  Genitourinary:  Negative for difficulty urinating.  Musculoskeletal:  Positive for joint pain, joint pain and morning stiffness. Negative for joint swelling, myalgias, muscle tenderness and myalgias.  Skin:  Positive for color change. Negative for rash, redness and sensitivity to sunlight.  Allergic/Immunologic: Negative for susceptible to infections.  Neurological:  Positive for numbness and headaches. Negative for dizziness and memory loss.   Hematological:  Negative for bruising/bleeding tendency and swollen glands.  Psychiatric/Behavioral:  Negative for depressed mood, confusion and sleep disturbance. The patient is not nervous/anxious.    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    Past Medical History:  Diagnosis Date   Abnormal uterine bleeding    History of chicken pox    Urinary tract infection 12/2014   Vaginal Pap smear, abnormal     Family History  Problem Relation Age of Onset   Diabetes Mother    Hypertension Mother    High Cholesterol Mother    Breast cancer Paternal Grandmother    Healthy Sister    Healthy Brother    Healthy Brother    Healthy Son    Healthy Son    Healthy Son    Past Surgical History:  Procedure Laterality Date   CESAREAN SECTION     LEEP     Social History   Social History Narrative   Not on file   Immunization History  Administered Date(s) Administered   Influenza,inj,Quad PF,6+ Mos 06/24/2018, 04/08/2019, 07/25/2020   PFIZER(Purple Top)SARS-COV-2 Vaccination 07/31/2020, 08/21/2020   Pneumococcal Polysaccharide-23 03/25/2018   Tdap 11/23/2014     Objective: Vital Signs: BP 125/85 (BP Location: Left Arm, Patient Position: Sitting, Cuff Size: Normal)   Pulse (!) 106   Ht 5\' 1"  (1.549 m)  Wt 159 lb (72.1 kg)   BMI 30.04 kg/m    Physical Exam Vitals and nursing note reviewed.  Constitutional:      Appearance: She is well-developed.  HENT:     Head: Normocephalic and atraumatic.  Eyes:     Conjunctiva/sclera: Conjunctivae normal.  Cardiovascular:     Rate and Rhythm: Normal rate and regular rhythm.     Heart sounds: Normal heart sounds.  Pulmonary:     Effort: Pulmonary effort is normal.     Breath sounds: Normal  breath sounds.  Abdominal:     General: Bowel sounds are normal.     Palpations: Abdomen is soft.  Musculoskeletal:     Cervical back: Normal range of motion.  Lymphadenopathy:     Cervical: No cervical adenopathy.  Skin:    General: Skin is warm and dry.     Capillary Refill: Capillary refill takes less than 2 seconds.  Neurological:     Mental Status: She is alert and oriented to person, place, and time.  Psychiatric:        Behavior: Behavior normal.     Musculoskeletal Exam: Cervical spine was in good range of motion.  Shoulder joints, elbow joints, wrist joints, MCPs PIPs with good range of motion.  She has some DIP thickening consistent with osteoarthritis.  Hip joints and knee joints with good range of motion.  No warmth swelling or effusion was noted.  There was no tenderness over ankles or MTPs.  CDAI Exam: CDAI Score: -- Patient Global: --; Provider Global: -- Swollen: --; Tender: -- Joint Exam 03/19/2021   No joint exam has been documented for this visit   There is currently no information documented on the homunculus. Go to the Rheumatology activity and complete the homunculus joint exam.  Investigation: No additional findings.  Imaging: No results found.  Recent Labs: Lab Results  Component Value Date   WBC 3.6 (L) 02/27/2021   HGB 13.0 02/27/2021   PLT 303 02/27/2021   NA 135 02/27/2021   K 4.1 02/27/2021   CL 101 02/27/2021   CO2 29 02/27/2021   GLUCOSE 90 02/27/2021   BUN 13 02/27/2021   CREATININE 0.70 02/27/2021   BILITOT 0.4 02/27/2021   ALKPHOS 62 08/09/2020   AST 24 02/27/2021   ALT 19 02/27/2021   PROT 7.6 02/27/2021   ALBUMIN 4.4 08/09/2020   CALCIUM 9.4 02/27/2021   GFRAA 125 07/11/2020    Speciality Comments: PLQ Eye Exam: 08/16/2020 WNL @ [facility] Follow up in 1 year  Procedures:  No procedures performed Allergies: Patient has no known allergies.   Assessment / Plan:     Visit Diagnoses: Sjogren's syndrome with  other organ involvement (HCC) - ANA 1:1280, Ro>8, Sicca sx, arthralgias, neutropenia, hypocomplementemia: She gives history of dry mouth and dry eyes.  She has been taking hydroxychloroquine on a regular basis.  Over-the-counter products were discussed.  High risk medication use - Plaquenil 200 mg 1 tablet by mouth twice daily Monday through Friday only. PLQ Eye Exam: 08/16/2020.  Labs are due in January.  Her labs from August 24 were reviewed which showed all neutropenia which has been stable.  All autoimmune labs were negative in August.  We will repeat labs in January.  History of neutropenia-stable.  Primary osteoarthritis of both knees-she complains of pain and discomfort in her knee joints.  No warmth swelling or effusion was noted.  A handout on lower extremity muscle strengthening exercises was given.  Primary osteoarthritis  of both feet-proper fitting shoes were discussed.  Other fatigue-she complains of ongoing fatigue.  Need for regular exercise was emphasized.  She does not exercise on a regular basis.  Increased risk of heart disease with autoimmune disease was also discussed.  A handout on exercise and dietary modifications was given.  Language barrier-an interpreter was present during the conversation.  Orders: Orders Placed This Encounter  Procedures   CBC with Differential/Platelet   COMPLETE METABOLIC PANEL WITH GFR   Urinalysis, Routine w reflex microscopic   Anti-DNA antibody, double-stranded   C3 and C4   Sedimentation rate      Follow-Up Instructions: Return in about 5 months (around 08/19/2021) for Sjogren's, Osteoarthritis.   Pollyann Savoy, MD  Note - This record has been created using Animal nutritionist.  Chart creation errors have been sought, but may not always  have been located. Such creation errors do not reflect on  the standard of medical care.

## 2021-03-06 IMAGING — MG DIGITAL SCREENING BILAT W/ TOMO W/ CAD
8 series · 9 of 24 positions shown · non-contrast
Comparison: None.

CLINICAL DATA: Screening.

EXAM:
DIGITAL SCREENING BILATERAL MAMMOGRAM WITH TOMO AND CAD

[L CC synth-2D]
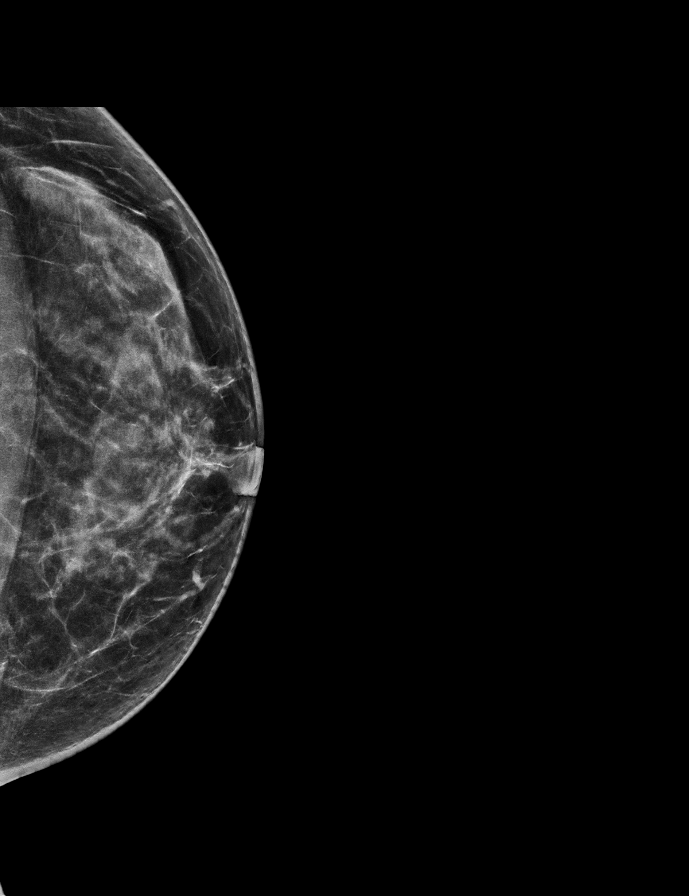

[L MLO synth-2D]
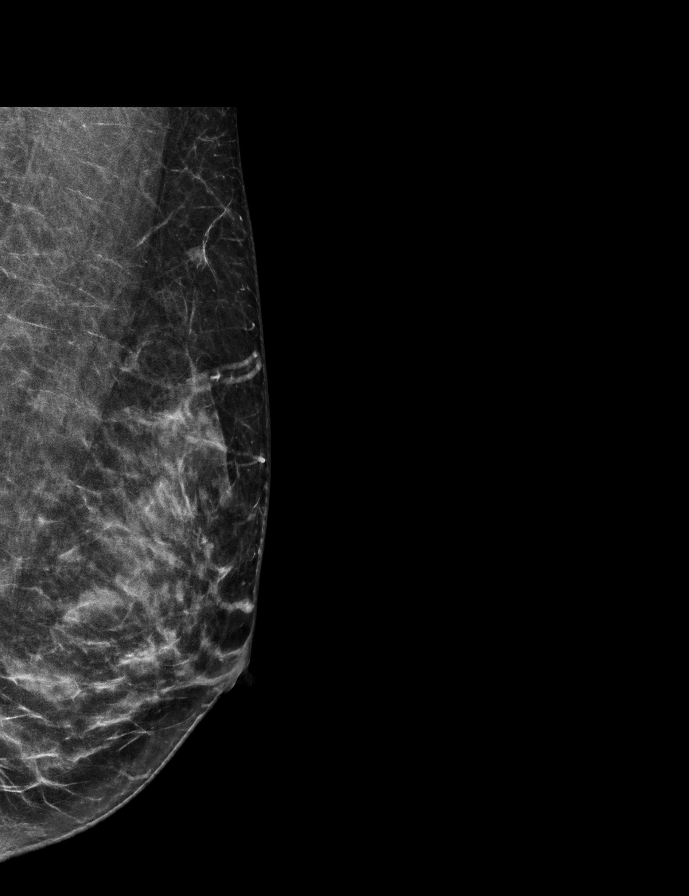

[R CC synth-2D]
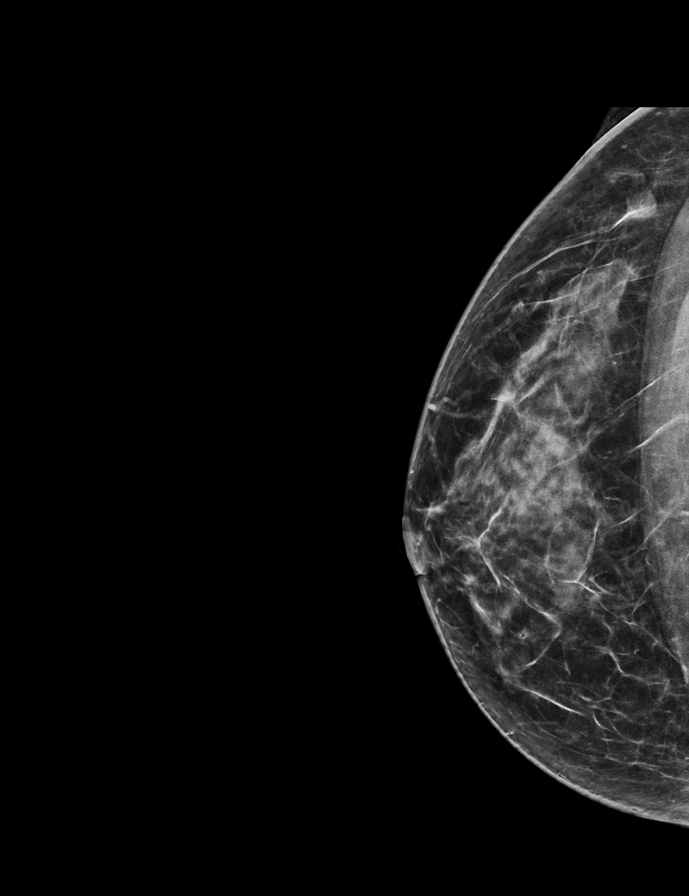

[R MLO synth-2D]
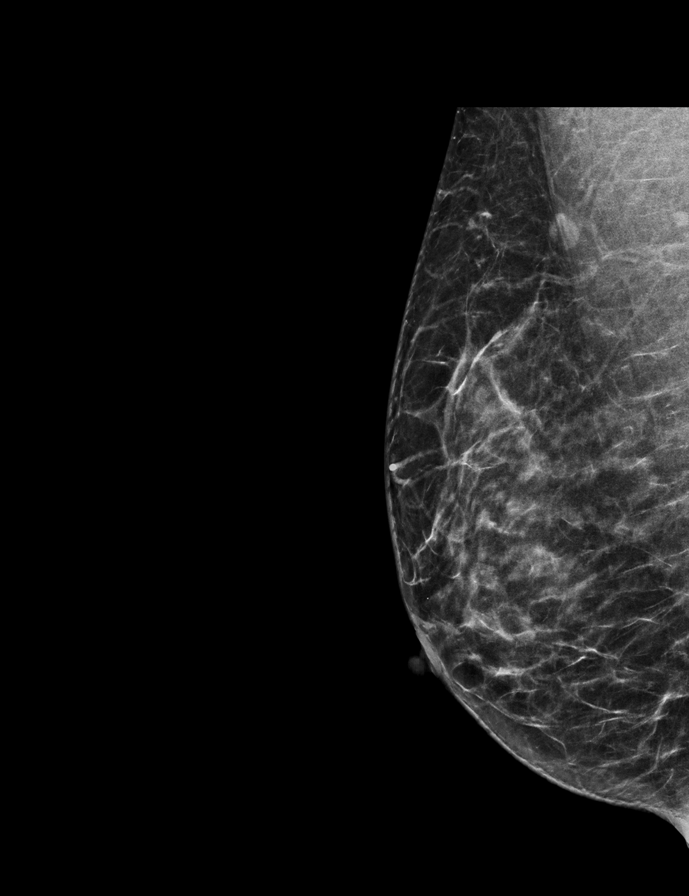

[L MLO tomo · 2 of 57 frames shown]
[frame 19/57]
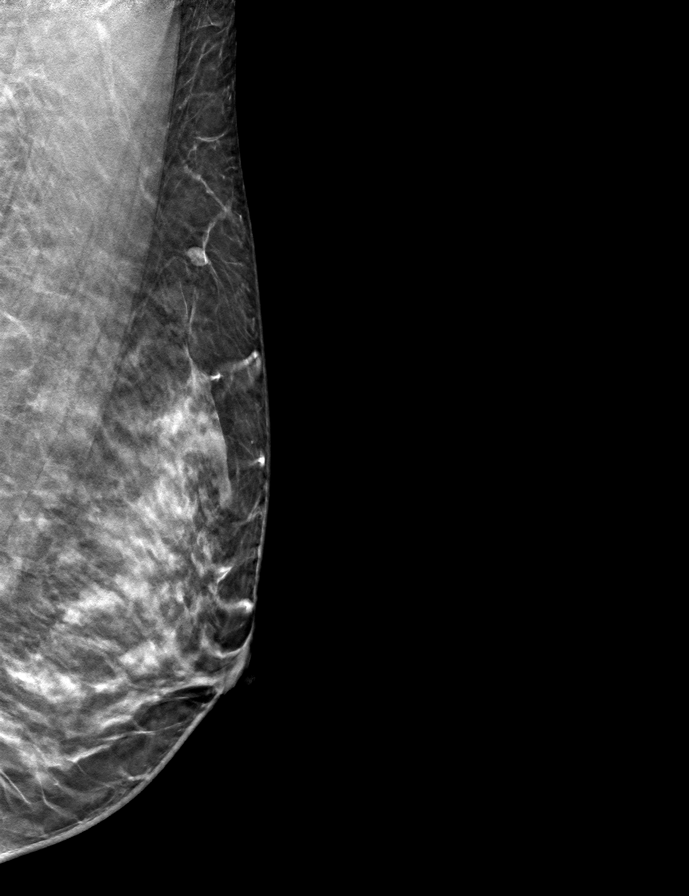
[frame 29/57]
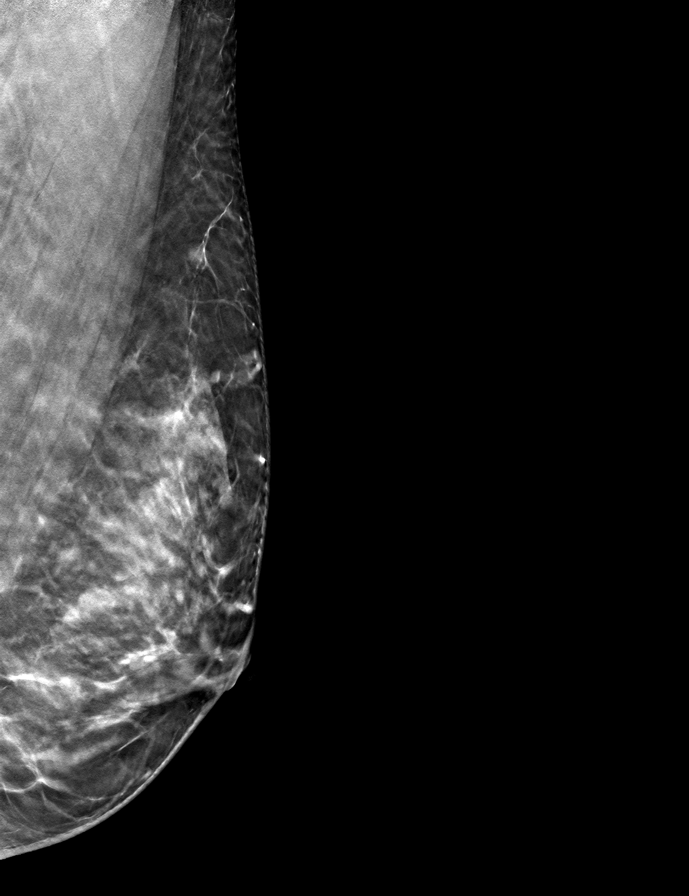

[R CC tomo · tomo slice 31/61.0]
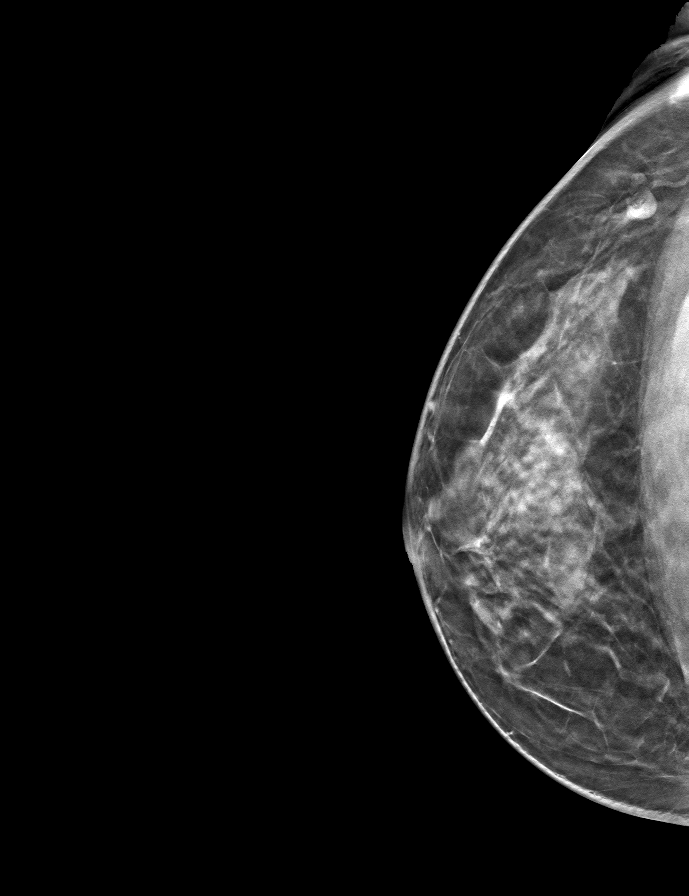

[L CC tomo · tomo slice 31/62.0]
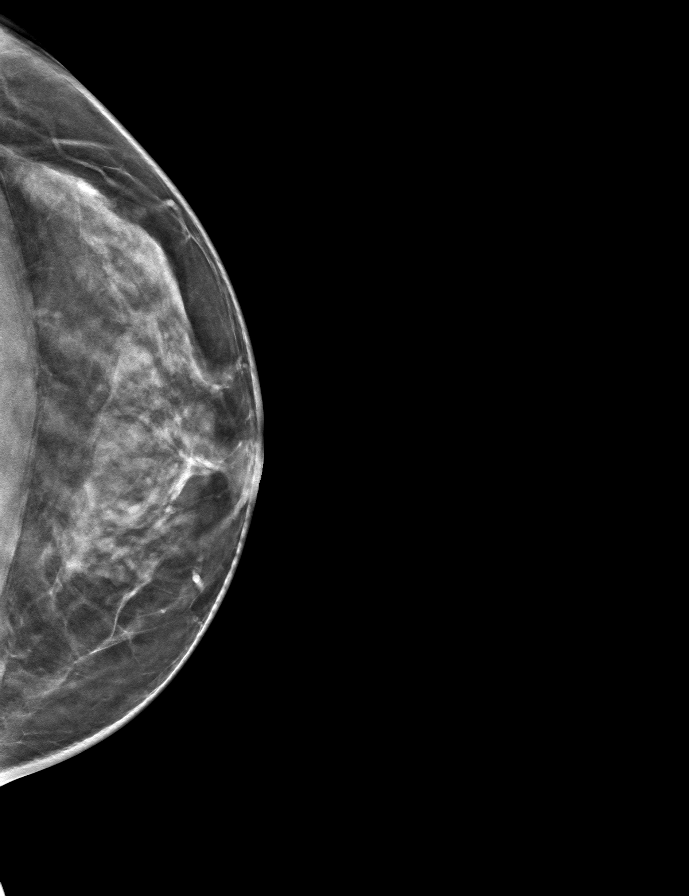

[R MLO tomo · tomo slice 28/55.0]
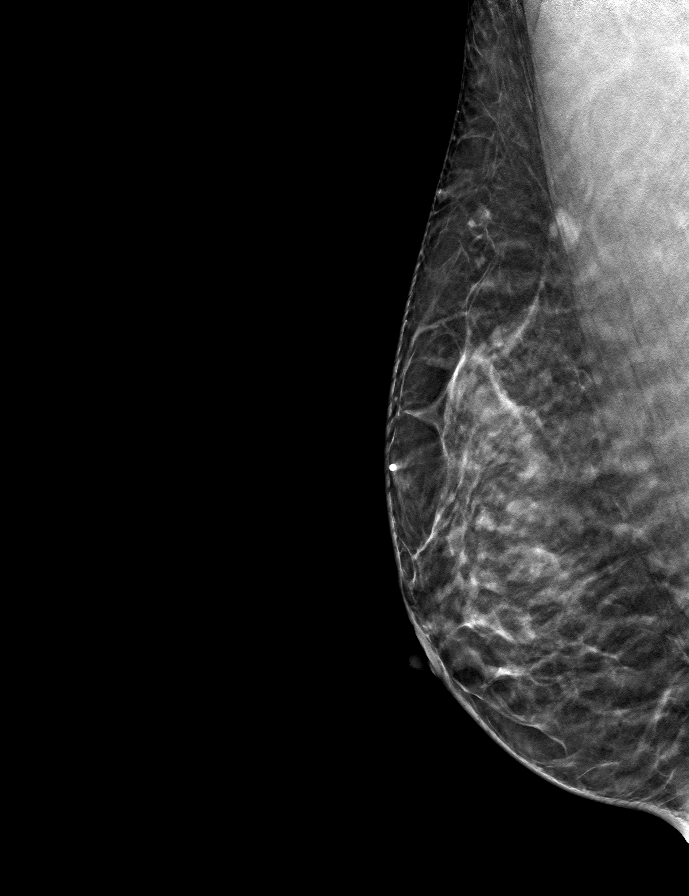

[9 of 24 positions shown; findings below may reference images not displayed]

ACR Breast Density Category c: The breast tissue is heterogeneously
dense, which may obscure small masses
FINDINGS: There are no findings suspicious for malignancy. Images were
processed with CAD.
IMPRESSION: No mammographic evidence of malignancy. A result letter of this
screening mammogram will be mailed directly to the patient.

RECOMMENDATION:
Screening mammogram in one year. (Code:EM-2-IHY)

BI-RADS CATEGORY  1: Negative.

## 2021-03-19 ENCOUNTER — Other Ambulatory Visit: Payer: Self-pay

## 2021-03-19 ENCOUNTER — Ambulatory Visit (INDEPENDENT_AMBULATORY_CARE_PROVIDER_SITE_OTHER): Payer: 59 | Admitting: Rheumatology

## 2021-03-19 ENCOUNTER — Encounter: Payer: Self-pay | Admitting: Rheumatology

## 2021-03-19 VITALS — BP 125/85 | HR 106 | Ht 61.0 in | Wt 159.0 lb

## 2021-03-19 DIAGNOSIS — R5383 Other fatigue: Secondary | ICD-10-CM

## 2021-03-19 DIAGNOSIS — M19071 Primary osteoarthritis, right ankle and foot: Secondary | ICD-10-CM

## 2021-03-19 DIAGNOSIS — Z79899 Other long term (current) drug therapy: Secondary | ICD-10-CM | POA: Diagnosis not present

## 2021-03-19 DIAGNOSIS — Z789 Other specified health status: Secondary | ICD-10-CM

## 2021-03-19 DIAGNOSIS — M19072 Primary osteoarthritis, left ankle and foot: Secondary | ICD-10-CM

## 2021-03-19 DIAGNOSIS — Z862 Personal history of diseases of the blood and blood-forming organs and certain disorders involving the immune mechanism: Secondary | ICD-10-CM

## 2021-03-19 DIAGNOSIS — M3509 Sicca syndrome with other organ involvement: Secondary | ICD-10-CM

## 2021-03-19 DIAGNOSIS — M170 Bilateral primary osteoarthritis of knee: Secondary | ICD-10-CM

## 2021-03-19 NOTE — Patient Instructions (Addendum)
Knee Exercises Ask your health care provider which exercises are safe for you. Do exercises exactly as told by your health care provider and adjust them as directed. It is normal to feel mild stretching, pulling, tightness, or discomfort as you do these exercises. Stop right away if you feel sudden pain or your pain gets worse. Do not begin these exercises until told by your health care provider. Stretching and range-of-motion exercises These exercises warm up your muscles and joints and improve the movement and flexibility of your knee. These exercises also help to relieve pain and swelling. Knee extension, prone Lie on your abdomen (prone position) on a bed. Place your left / right knee just beyond the edge of the surface so your knee is not on the bed. You can put a towel under your left / right thigh just above your kneecap for comfort. Relax your leg muscles and allow gravity to straighten your knee (extension). You should feel a stretch behind your left / right knee. Hold this position for __________ seconds. Scoot up so your knee is supported between repetitions. Repeat __________ times. Complete this exercise __________ times a day. Knee flexion, active  Lie on your back with both legs straight. If this causes back discomfort, bend your left / right knee so your foot is flat on the floor. Slowly slide your left / right heel back toward your buttocks. Stop when you feel a gentle stretch in the front of your knee or thigh (flexion). Hold this position for __________ seconds. Slowly slide your left / right heel back to the starting position. Repeat __________ times. Complete this exercise __________ times a day. Quadriceps stretch, prone  Lie on your abdomen on a firm surface, such as a bed or padded floor. Bend your left / right knee and hold your ankle. If you cannot reach your ankle or pant leg, loop a belt around your foot and grab the belt instead. Gently pull your heel toward your  buttocks. Your knee should not slide out to the side. You should feel a stretch in the front of your thigh and knee (quadriceps). Hold this position for __________ seconds. Repeat __________ times. Complete this exercise __________ times a day. Hamstring, supine Lie on your back (supine position). Loop a belt or towel over the ball of your left / right foot. The ball of your foot is on the walking surface, right under your toes. Straighten your left / right knee and slowly pull on the belt to raise your leg until you feel a gentle stretch behind your knee (hamstring). Do not let your knee bend while you do this. Keep your other leg flat on the floor. Hold this position for __________ seconds. Repeat __________ times. Complete this exercise __________ times a day. Strengthening exercises These exercises build strength and endurance in your knee. Endurance is the ability to use your muscles for a long time, even after they get tired. Quadriceps, isometric This exercise stretches the muscles in front of your thigh (quadriceps) without moving your knee joint (isometric). Lie on your back with your left / right leg extended and your other knee bent. Put a rolled towel or small pillow under your knee if told by your health care provider. Slowly tense the muscles in the front of your left / right thigh. You should see your kneecap slide up toward your hip or see increased dimpling just above the knee. This motion will push the back of the knee toward the floor. For __________   seconds, hold the muscle as tight as you can without increasing your pain. Relax the muscles slowly and completely. Repeat __________ times. Complete this exercise __________ times a day. Straight leg raises This exercise stretches the muscles in front of your thigh (quadriceps) and the muscles that move your hips (hip flexors). Lie on your back with your left / right leg extended and your other knee bent. Tense the muscles in  the front of your left / right thigh. You should see your kneecap slide up or see increased dimpling just above the knee. Your thigh may even shake a bit. Keep these muscles tight as you raise your leg 4-6 inches (10-15 cm) off the floor. Do not let your knee bend. Hold this position for __________ seconds. Keep these muscles tense as you lower your leg. Relax your muscles slowly and completely after each repetition. Repeat __________ times. Complete this exercise __________ times a day. Hamstring, isometric Lie on your back on a firm surface. Bend your left / right knee about __________ degrees. Dig your left / right heel into the surface as if you are trying to pull it toward your buttocks. Tighten the muscles in the back of your thighs (hamstring) to "dig" as hard as you can without increasing any pain. Hold this position for __________ seconds. Release the tension gradually and allow your muscles to relax completely for __________ seconds after each repetition. Repeat __________ times. Complete this exercise __________ times a day. Hamstring curls If told by your health care provider, do this exercise while wearing ankle weights. Begin with __________ lb weights. Then increase the weight by 1 lb (0.5 kg) increments. Do not wear ankle weights that are more than __________ lb. Lie on your abdomen with your legs straight. Bend your left / right knee as far as you can without feeling pain. Keep your hips flat against the floor. Hold this position for __________ seconds. Slowly lower your leg to the starting position. Repeat __________ times. Complete this exercise __________ times a day. Squats This exercise strengthens the muscles in front of your thigh and knee (quadriceps). Stand in front of a table, with your feet and knees pointing straight ahead. You may rest your hands on the table for balance but not for support. Slowly bend your knees and lower your hips like you are going to sit in a  chair. Keep your weight over your heels, not over your toes. Keep your lower legs upright so they are parallel with the table legs. Do not let your hips go lower than your knees. Do not bend lower than told by your health care provider. If your knee pain increases, do not bend as low. Hold the squat position for __________ seconds. Slowly push with your legs to return to standing. Do not use your hands to pull yourself to standing. Repeat __________ times. Complete this exercise __________ times a day. Wall slides This exercise strengthens the muscles in front of your thigh and knee (quadriceps). Lean your back against a smooth wall or door, and walk your feet out 18-24 inches (46-61 cm) from it. Place your feet hip-width apart. Slowly slide down the wall or door until your knees bend __________ degrees. Keep your knees over your heels, not over your toes. Keep your knees in line with your hips. Hold this position for __________ seconds. Repeat __________ times. Complete this exercise __________ times a day. Straight leg raises This exercise strengthens the muscles that rotate the leg at the hip and   move it away from your body (hip abductors). Lie on your side with your left / right leg in the top position. Lie so your head, shoulder, knee, and hip line up. You may bend your bottom knee to help you keep your balance. Roll your hips slightly forward so your hips are stacked directly over each other and your left / right knee is facing forward. Leading with your heel, lift your top leg 4-6 inches (10-15 cm). You should feel the muscles in your outer hip lifting. Do not let your foot drift forward. Do not let your knee roll toward the ceiling. Hold this position for __________ seconds. Slowly return your leg to the starting position. Let your muscles relax completely after each repetition. Repeat __________ times. Complete this exercise __________ times a day. Straight leg raises This  exercise stretches the muscles that move your hips away from the front of the pelvis (hip extensors). Lie on your abdomen on a firm surface. You can put a pillow under your hips if that is more comfortable. Tense the muscles in your buttocks and lift your left / right leg about 4-6 inches (10-15 cm). Keep your knee straight as you lift your leg. Hold this position for __________ seconds. Slowly lower your leg to the starting position. Let your leg relax completely after each repetition. Repeat __________ times. Complete this exercise __________ times a day. This information is not intended to replace advice given to you by your health care provider. Make sure you discuss any questions you have with your health care provider. Document Revised: 04/13/2018 Document Reviewed: 04/13/2018 Elsevier Patient Education  2022 [employer] Standing Labs We placed an order today for your standing lab work.   Please have your standing labs drawn in January  If possible, please have your labs drawn 2 weeks prior to your appointment so that the provider can discuss your results at your appointment.  Please note that you may see your imaging and lab results in MyChart before we have reviewed them. We may be awaiting multiple results to interpret others before contacting you. Please allow our office up to 72 hours to thoroughly review all of the results before contacting the office for clarification of your results.  We have open lab daily: Monday through Thursday from 1:30-4:30 PM and Friday from 1:30-4:00 PM at the office of Dr. Pollyann Savoy, [facility].   Please be advised, all patients with office appointments requiring lab work will take precedent over walk-in lab work.  If possible, please come for your lab work on Monday and Friday afternoons, as you may experience shorter wait times. The office is located at [address], [place], [place] [address] No appointment is  necessary.   Labs are drawn by [facility]. Please bring your co-pay at the time of your lab draw.  You may receive a bill from [facility] for your lab work.  If you wish to have your labs drawn at another location, please call the office 24 hours in advance to send orders.  If you have any questions regarding directions or hours of operation,  please call [phone].   As a reminder, please drink plenty of water prior to coming for your lab work. Thanks!  Vaccines You are taking a medication(s) that can suppress your immune system.  The following immunizations are recommended: Flu annually Covid-19  Td/Tdap (tetanus, diphtheria, pertussis) every 10 years Pneumonia (Prevnar 15 then Pneumovax 23 at least 1 year apart.  Alternatively, can  take Prevnar 20 without needing additional dose) Shingrix: 2 doses from 4 weeks to 6 months apart  Please check with your PCP to make sure you are up to date.   Heart Disease Prevention   Your inflammatory disease increases your risk of heart disease which includes heart attack, stroke, atrial fibrillation (irregular heartbeats), high blood pressure, heart failure and atherosclerosis (plaque in the arteries).  It is important to reduce your risk by:   Keep blood pressure, cholesterol, and blood sugar at healthy levels   Smoking Cessation   Maintain a healthy weight  BMI 20-25   Eat a healthy diet  Plenty of fresh fruit, vegetables, and whole grains  Limit saturated fats, foods high in sodium, and added sugars  DASH and Mediterranean diet   Increase physical activity  Recommend moderate physically activity for 150 minutes per week/ 30 minutes a day for five days a week These can be broken up into three separate ten-minute sessions during the day.   Reduce Stress  Meditation, slow breathing exercises, yoga, coloring books  Dental visits twice a year

## 2021-05-17 ENCOUNTER — Other Ambulatory Visit: Payer: Self-pay

## 2021-05-17 ENCOUNTER — Encounter: Payer: Self-pay | Admitting: Family

## 2021-05-17 ENCOUNTER — Ambulatory Visit (INDEPENDENT_AMBULATORY_CARE_PROVIDER_SITE_OTHER): Payer: 59 | Admitting: Family

## 2021-05-17 VITALS — BP 119/80 | HR 97 | Temp 98.3°F | Ht 61.0 in | Wt 155.8 lb

## 2021-05-17 DIAGNOSIS — N951 Menopausal and female climacteric states: Secondary | ICD-10-CM | POA: Diagnosis not present

## 2021-05-17 MED ORDER — NORETHINDRONE ACET-ETHINYL EST 1-20 MG-MCG PO TABS: 1.0000 | ORAL_TABLET | Freq: Every day | ORAL | 11 refills | Status: DC

## 2021-05-17 NOTE — Assessment & Plan Note (Addendum)
Pt c/o hot flashes with night sweats, frequent headaches, mood swings, difficulty sleeping and irregular cycles. Discussed medication options including herbal remedies, pt wants to start low dose OCP. Mammogram UTD. Reports breast cancer in maternal aunt, smokes 1 day/week (advised on complete smoking cessation).

## 2021-05-17 NOTE — Patient Instructions (Signed)
It was very nice to see you today!  Your prescription has been sent to your pharmacy. Please schedule a 2 month follow up visit.  PLEASE NOTE:  If you had any lab tests please let us know if you have not heard back within a few days. You may see your results on mychart before we have a chance to review them but we will give you a call once they are reviewed by [place]. If we ordered any referrals today, please let us know if you have not heard from their office within the next week.   Please try these tips to maintain a healthy lifestyle:  Eat most of your calories during the day when you are active. Eliminate processed foods including packaged sweets (pies, cakes, cookies), reduce intake of potatoes, white bread, white pasta, and white rice. Look for whole grain options, oat flour or almond flour.  Each meal should contain half fruits/vegetables, one quarter protein, and one quarter carbs (no bigger than a computer mouse).  Cut down on sweet beverages. This includes juice, soda, and sweet tea. Also watch fruit intake, though this is a healthier sweet option, it still contains natural sugar! Limit to 3 servings daily.  Drink at least 1 glass of water with each meal and aim for at least 8 glasses per day  Exercise at least 150 minutes every week.

## 2021-05-17 NOTE — Progress Notes (Signed)
Subjective:     Patient ID: [name], female    DOB: 12-03-1975, 45 y.o.   MRN: [id number]  Chief Complaint  Patient presents with   Headache    Starting about a month ago. Having them everyday. Has been losing sleep.    Night Sweats   Mood swings   HPI: Menopausal SX:   The patient is not taking hormone replacement therapy.   last pap: was normal and last mammogram: was normal. Patient is hormone deficient due to perimenopausal, which occurred at age 89.  The patient currently has symptoms of decreased libido, hot flashes, insomnia, moodiness, no energy, and headaches.   Health Maintenance Due  Topic Date Due   Pneumococcal Vaccine 63-38 Years old (2 - PCV) 03/26/2019   MAMMOGRAM  10/04/2020   INFLUENZA VACCINE  02/04/2021    Past Medical History:  Diagnosis Date   Abnormal uterine bleeding    History of chicken pox    Urinary tract infection 12/2014   Vaginal Pap smear, abnormal     Past Surgical History:  Procedure Laterality Date   CESAREAN SECTION     LEEP      Outpatient Medications Prior to Visit  Medication Sig Dispense Refill   DULoxetine (CYMBALTA) 60 MG capsule TAKE ONE CAPSULE BY MOUTH EVERY DAY 90 capsule 1   GABAPENTIN PO Take by mouth at bedtime.     hydroxychloroquine (PLAQUENIL) 200 MG tablet Take 1 tablet po BID Monday-Friday only. 120 tablet 0   Multiple Vitamin (MULTIVITAMIN) tablet Take 1 tablet by mouth daily.     sucralfate (CARAFATE) 1 g tablet 1 tablet on an empty stomach     vitamin C (ASCORBIC ACID) 500 MG tablet Take 500 mg by mouth daily.     meloxicam (MOBIC) 15 MG tablet TAKE 1 TABLET BY MOUTH EVERY DAY (Patient not taking: No sig reported) 90 tablet 1   omeprazole (PRILOSEC) 20 MG capsule Take 1 capsule (20 mg total) by mouth daily. (Patient not taking: No sig reported) 30 capsule 0   No facility-administered medications prior to visit.    No Known Allergies      Objective:    Physical Exam Vitals and nursing  note reviewed.  Constitutional:      Appearance: Normal appearance.  Cardiovascular:     Rate and Rhythm: Normal rate and regular rhythm.  Pulmonary:     Effort: Pulmonary effort is normal.     Breath sounds: Normal breath sounds.  Musculoskeletal:        General: Normal range of motion.  Skin:    General: Skin is warm and dry.  Neurological:     Mental Status: She is alert.  Psychiatric:        Mood and Affect: Mood normal.        Behavior: Behavior normal.    BP 119/80   Pulse 97   Temp 98.3 F (36.8 C) (Temporal)   Ht 5\' 1"  (1.549 m)   Wt 155 lb 12.8 oz (70.7 kg)   LMP 05/14/2021   SpO2 100%   BMI 29.44 kg/m  Wt Readings from Last 3 Encounters:  05/17/21 155 lb 12.8 oz (70.7 kg)  03/19/21 159 lb (72.1 kg)  10/10/20 154 lb 6.4 oz (70 kg)       Assessment & Plan:   Problem List Items Addressed This Visit       Other   Perimenopausal symptoms - Primary    Pt c/o hot flashes with night sweats,  frequent headaches, mood swings, difficulty sleeping and irregular cycles. Discussed medication options including herbal remedies, pt wants to start low dose OCP. Mammogram UTD. Reports breast cancer in maternal aunt, smokes 1 day/week (advised on complete smoking cessation).      Relevant Medications   norethindrone-ethinyl estradiol (LOESTRIN 1/20, 21,) 1-20 MG-MCG tablet    Meds ordered this encounter  Medications   norethindrone-ethinyl estradiol (LOESTRIN 1/20, 21,) 1-20 MG-MCG tablet    Sig: Take 1 tablet by mouth daily.    Dispense:  28 tablet    Refill:  11    Order Specific Question:   Supervising Provider    Answer:   ANDY, CAMILLE L [[id number]]

## 2021-05-26 ENCOUNTER — Other Ambulatory Visit: Payer: Self-pay | Admitting: Physician Assistant

## 2021-05-27 ENCOUNTER — Telehealth: Payer: Self-pay

## 2021-05-27 NOTE — Telephone Encounter (Signed)
Next Visit: 08/20/2021  Last Visit: 03/19/2021  Labs: 02/27/2021 count is borderline low but stable-3.6. Rest of CBC WNL.  CMP WNL.  ESR and complements WNL.  UA normal.    Eye exam: 08/16/2020 WNL   Current Dose per office note 03/19/2021: Plaquenil 200 mg 1 tablet by mouth twice daily Monday through Friday only  RZ:NBVAPOL'I syndrome with other organ involvement   Last Fill: 02/27/2021  Okay to refill Plaquenil?

## 2021-05-27 NOTE — Telephone Encounter (Signed)
Left message to advise patient her prescription has been sent to the pharmacy.

## 2021-05-27 NOTE — Telephone Encounter (Signed)
Patient called requesting prescription refill of Hydroxychloroquine to be sent to [facility] at [address] 

## 2021-06-04 ENCOUNTER — Telehealth: Payer: Self-pay

## 2021-06-04 NOTE — Telephone Encounter (Signed)
..   Encourage patient to contact the pharmacy for refills or they can request refills through [facility]  LAST APPOINTMENT DATE:  05/17/21  NEXT APPOINTMENT DATE:  07/17/21  MEDICATION:  Gabapentin PO 300mg  - 2am/ 1pm  Is the patient out of medication?   PHARMACY: [facility] at [facility]  Let patient know to contact pharmacy at the end of the day to make sure medication is ready.  Please notify patient to allow 48-72 hours to process  CLINICAL FILLS OUT ALL BELOW:   LAST REFILL:  QTY:  REFILL DATE:    OTHER COMMENTS:    Okay for refill?  Please advise

## 2021-06-04 NOTE — Telephone Encounter (Signed)
Medication was d/c'd on 10/10/2020 okay to refill?

## 2021-06-05 NOTE — Telephone Encounter (Signed)
Spoke with patient and she scheduled a visit on 06/07/2021 discuss refill of gabapentin.  Nothing further needed at this time.

## 2021-06-05 NOTE — Telephone Encounter (Signed)
Left message for patient to return call to office concerning refill request.

## 2021-06-07 ENCOUNTER — Encounter: Payer: Self-pay | Admitting: Family

## 2021-06-07 ENCOUNTER — Other Ambulatory Visit: Payer: Self-pay

## 2021-06-07 ENCOUNTER — Ambulatory Visit (INDEPENDENT_AMBULATORY_CARE_PROVIDER_SITE_OTHER): Payer: 59 | Admitting: Family

## 2021-06-07 VITALS — BP 136/88 | HR 100 | Temp 98.8°F | Ht 61.0 in | Wt 157.4 lb

## 2021-06-07 DIAGNOSIS — G619 Inflammatory polyneuropathy, unspecified: Secondary | ICD-10-CM

## 2021-06-07 MED ORDER — GABAPENTIN 300 MG PO CAPS: 300.0000 mg | ORAL_CAPSULE | Freq: Two times a day (BID) | ORAL | 0 refills | Status: DC

## 2021-06-07 NOTE — Assessment & Plan Note (Signed)
Pt also has Sjogren's and Osteoarthritis of multiple joints. Pt reports she stopped Mobic, worried about side effects and stopped Gabapentin earlier this year as she was feeling better and didn't think she needed any longer. Now having returning sx, she also reports her RA provider reduced her Methotrexate dose, advised pt to discuss this new pain with RA, will go ahead and refill Gabapentin for now.

## 2021-06-07 NOTE — Patient Instructions (Signed)
It was very nice to see you today!  I have sent the Gabapentin refill to your pharmacy. As discussed, please get in touch with your Rheumatologist and discuss your increase in pain, Methotrexate dose, ask about restarting Meloxicam, and let them know I have restarted the Gabapentin.   PLEASE NOTE:  If you had any lab tests please let us know if you have not heard back within a few days. You may see your results on MyChart before we have a chance to review them but we will give you a call once they are reviewed by [place]. If we ordered any referrals today, please let us know if you have not heard from their office within the next week.   Please try these tips to maintain a healthy lifestyle:  Eat most of your calories during the day when you are active. Eliminate processed foods including packaged sweets (pies, cakes, cookies), reduce intake of potatoes, white bread, white pasta, and white rice. Look for whole grain options, oat flour or almond flour.  Each meal should contain half fruits/vegetables, one quarter protein, and one quarter carbs (no bigger than a computer mouse).  Cut down on sweet beverages. This includes juice, soda, and sweet tea. Also watch fruit intake, though this is a healthier sweet option, it still contains natural sugar! Limit to 3 servings daily.  Drink at least 1 glass of water with each meal and aim for at least 8 glasses per day  Exercise at least 150 minutes every week.

## 2021-06-07 NOTE — Progress Notes (Signed)
Subjective:     Patient ID: [name], female    DOB: Jan 23, 1976, 45 y.o.   MRN: [id number]  Chief Complaint  Patient presents with   Medication Refill    Gabapentin   Leg Pain    Starting 3 days ago. She has taken Motrin, but symptoms have not subsided.   Arm Pain   Back Pain   HPI: Neuropathy: She describes symptoms of burning and tingling. Onset of symptoms was  years ago . Symptoms are currently of moderate severity. Symptoms occur intermittently and last hours. The patient denies numbness and lancinating pain. Symptoms are symmetric. She also describes autonomic symptoms of  dry eyes and dry mouth. She denies  nausea, diarrhea, constipation, and blurred vision. Previous treatment has included Gabapentin, which has improved symptoms.   Health Maintenance Due  Topic Date Due   Pneumococcal Vaccine 15-9 Years old (2 - PCV) 03/26/2019   COVID-19 Vaccine (3 - Pfizer risk series) 09/18/2020   MAMMOGRAM  10/04/2020    Past Medical History:  Diagnosis Date   Abnormal uterine bleeding    History of chicken pox    Urinary tract infection 12/2014   Vaginal Pap smear, abnormal     Past Surgical History:  Procedure Laterality Date   CESAREAN SECTION     LEEP      Outpatient Medications Prior to Visit  Medication Sig Dispense Refill   DULoxetine (CYMBALTA) 60 MG capsule TAKE ONE CAPSULE BY MOUTH EVERY DAY 90 capsule 1   hydroxychloroquine (PLAQUENIL) 200 MG tablet TAKE 1 TABLET BY MOUTH TWICE DAILY MONDAY THROUGH FRIDAY ONLY 120 tablet 0   Multiple Vitamin (MULTIVITAMIN) tablet Take 1 tablet by mouth daily.     norethindrone-ethinyl estradiol (LOESTRIN 1/20, 21,) 1-20 MG-MCG tablet Take 1 tablet by mouth daily. 28 tablet 11   sucralfate (CARAFATE) 1 g tablet 1 tablet on an empty stomach     vitamin C (ASCORBIC ACID) 500 MG tablet Take 500 mg by mouth daily.     GABAPENTIN PO Take by mouth at bedtime.     meloxicam (MOBIC) 15 MG tablet TAKE 1 TABLET BY MOUTH  EVERY DAY (Patient not taking: Reported on 03/19/2021) 90 tablet 1   omeprazole (PRILOSEC) 20 MG capsule Take 1 capsule (20 mg total) by mouth daily. (Patient not taking: Reported on 03/19/2021) 30 capsule 0   No facility-administered medications prior to visit.    No Known Allergies      Objective:    Physical Exam Vitals and nursing note reviewed.  Constitutional:      Appearance: Normal appearance.  Cardiovascular:     Rate and Rhythm: Normal rate and regular rhythm.  Pulmonary:     Effort: Pulmonary effort is normal.     Breath sounds: Normal breath sounds.  Musculoskeletal:        General: Normal range of motion.  Skin:    General: Skin is warm and dry.  Neurological:     Mental Status: She is alert.  Psychiatric:        Mood and Affect: Mood normal.        Behavior: Behavior normal.    BP 136/88   Pulse 100   Temp 98.8 F (37.1 C) (Temporal)   Ht 5\' 1"  (1.549 m)   Wt 157 lb 6.4 oz (71.4 kg)   LMP 05/14/2021   SpO2 99%   BMI 29.74 kg/m  Wt Readings from Last 3 Encounters:  06/07/21 157 lb 6.4 oz (71.4 kg)  05/17/21  155 lb 12.8 oz (70.7 kg)  03/19/21 159 lb (72.1 kg)       Assessment & Plan:   Problem List Items Addressed This Visit       Nervous and Auditory   Inflammatory neuropathy (HCC) - Primary    Pt also has Sjogren's and Osteoarthritis of multiple joints. Pt reports she stopped Mobic, worried about side effects and stopped Gabapentin earlier this year as she was feeling better and didn't think she needed any longer. Now having returning sx, she also reports her RA provider reduced her Methotrexate dose, advised pt to discuss this new pain with RA, will go ahead and refill Gabapentin for now.      Relevant Medications   gabapentin (NEURONTIN) 300 MG capsule    Meds ordered this encounter  Medications   gabapentin (NEURONTIN) 300 MG capsule    Sig: Take 1 capsule (300 mg total) by mouth 2 (two) times daily. 1 capsule in morning, 2 capsules in  the evening.    Dispense:  90 capsule    Refill:  0    Order Specific Question:   Supervising Provider    Answer:   ANDY, CAMILLE L [[id number]]

## 2021-06-24 ENCOUNTER — Other Ambulatory Visit: Payer: Self-pay | Admitting: Physician Assistant

## 2021-07-17 ENCOUNTER — Other Ambulatory Visit: Payer: Self-pay

## 2021-07-17 ENCOUNTER — Ambulatory Visit: Payer: 59 | Admitting: Family

## 2021-08-06 NOTE — Progress Notes (Signed)
Office Visit Note  Patient: [name]             Date of Birth: Nov 13, 1975           MRN: [id number]             PCP: Dulce Sellar, NP Referring: Orland Mustard, MD Visit Date: 08/20/2021 Occupation: @GUAROCC @  Subjective:  Sicca symptoms   History of Present Illness: [name] is a 46 y.o. female with history of Sjogren's syndrome and osteoarthritis.  She is taking Plaquenil 200 mg 1 tablet by mouth twice daily Monday through Friday.  She is tolerating Plaquenil without any side effects.  She continues to have chronic sicca symptoms which have been unchanged.  She is using mouthwash for symptomatic relief as well as Systane eyedrops.  She has occasional oral ulcerations but no nasal ulcerations.  She has not had any shortness of breath, pleuritic chest pain or palpitations.  She has not had any recent rashes or hair loss.   She presents today with increased left-sided lower back pain.  Her discomfort started 3 days ago.  No recent injury or fall.  She denies any change in activity.  She has occasional discomfort in her lower back which typically does not respond to Tylenol or over-the-counter products.  She states the pain has been radiating down her left leg.  She denies any lower extremity muscle weakness.   Activities of Daily Living:  Patient reports morning stiffness for 1 minute.   Patient Reports nocturnal pain.  Difficulty dressing/grooming: Denies Difficulty climbing stairs: Denies Difficulty getting out of chair: Denies Difficulty using hands for taps, buttons, cutlery, and/or writing: Denies  Review of Systems  Constitutional:  Negative for fatigue.  HENT:  Positive for mouth dryness.   Eyes:  Positive for dryness.  Respiratory:  Negative for shortness of breath.   Cardiovascular:  Negative for swelling in legs/feet.  Gastrointestinal:  Positive for constipation.  Endocrine: Positive for cold intolerance, heat intolerance and increased  urination.  Genitourinary:  Negative for difficulty urinating.  Musculoskeletal:  Positive for joint pain, joint pain, joint swelling, morning stiffness and muscle tenderness.  Skin:  Negative for rash.  Allergic/Immunologic: Negative for susceptible to infections.  Neurological:  Positive for numbness and weakness.  Hematological:  Positive for bruising/bleeding tendency.  Psychiatric/Behavioral:  Positive for sleep disturbance.    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    Past Medical History:  Diagnosis Date   Abnormal uterine bleeding    History of chicken pox    Sjogren's disease (HCC)    Urinary tract infection 12/2014   Vaginal Pap smear, abnormal     Family History  Problem Relation Age of Onset   Diabetes Mother    Hypertension Mother    High Cholesterol Mother    Healthy Sister    Healthy Brother    Healthy Brother    Breast cancer Paternal Grandmother    Healthy Son    Healthy Son    Healthy Son    Past Surgical History:  Procedure Laterality Date   CESAREAN SECTION     LEEP     Social History   Social History Narrative  Not on file   Immunization History  Administered Date(s) Administered   Influenza,inj,Quad PF,6+ Mos 06/24/2018, 04/08/2019, 07/25/2020, 05/17/2021   PFIZER(Purple Top)SARS-COV-2 Vaccination 07/31/2020, 08/21/2020   Pneumococcal Polysaccharide-23 03/25/2018   Tdap 11/23/2014     Objective: Vital Signs: BP 123/81 (BP Location: Left Arm, Patient Position: Sitting, Cuff Size: Normal)    Pulse (!) 105    Resp 15    Ht 5\' 1"  (1.549 m)    Wt 158 lb (71.7 kg)    BMI 29.85 kg/m    Physical Exam Vitals and nursing  note reviewed.  Constitutional:      Appearance: She is well-developed.  HENT:     Head: Normocephalic and atraumatic.  Eyes:     Conjunctiva/sclera: Conjunctivae normal.  Pulmonary:     Effort: Pulmonary effort is normal.  Abdominal:     Palpations: Abdomen is soft.  Musculoskeletal:     Cervical back: Normal range of motion.  Skin:    General: Skin is warm and dry.     Capillary Refill: Capillary refill takes less than 2 seconds.  Neurological:     Mental Status: She is alert and oriented to person, place, and time.  Psychiatric:        Behavior: Behavior normal.     Musculoskeletal Exam: C-spine, thoracic spine, and lumbar spine have good ROM.  No midline spinal tenderness.  Tenderness over both SI joints, left greater than right. Shoulder joints, elbow joints, wrist joints, MCPs, PIPs, and DIPs good ROM with no synovitis.  Complete fist formation bilaterally.  Hip joints have good ROM with no groin pain.  Knee joints have good ROM with no warmth or effusion.  Ankle joints have good ROM with no tenderness or joint swelling.   CDAI Exam: CDAI Score: -- Patient Global: --; Provider Global: -- Swollen: --; Tender: -- Joint Exam 08/20/2021   No joint exam has been documented for this visit   There is currently no information documented on the homunculus. Go to the Rheumatology activity and complete the homunculus joint exam.  Investigation: No additional findings.  Imaging: No results found.  Recent Labs: Lab Results  Component Value Date   WBC 3.6 (L) 02/27/2021   HGB 13.0 02/27/2021   PLT 303 02/27/2021   NA 135 02/27/2021   K 4.1 02/27/2021   CL 101 02/27/2021   CO2 29 02/27/2021   GLUCOSE 90 02/27/2021   BUN 13 02/27/2021   CREATININE 0.70 02/27/2021   BILITOT 0.4 02/27/2021   ALKPHOS 62 08/09/2020   AST 24 02/27/2021   ALT 19 02/27/2021   PROT 7.6 02/27/2021   ALBUMIN 4.4 08/09/2020   CALCIUM 9.4 02/27/2021   GFRAA 125 07/11/2020    Speciality  Comments: PLQ Eye Exam: 08/16/2020 WNL @ [facility] Follow up in 1 year  Procedures:  No procedures performed Allergies: Patient has no known allergies.   Assessment / Plan:     Visit Diagnoses: Sjogren's syndrome with other organ involvement (HCC) - ANA 1:1280, Ro>8, Sicca sx, arthralgias, neutropenia, hypocomplementemia: She has not had any signs or symptoms of a flare since her last office visit.  She has clinically been doing well taking Plaquenil 200 mg 1 tablet by mouth twice daily Monday through Friday.  She is tolerating Plaquenil without any side effects.  Her sicca symptoms have been stable.  She has been using mouth rinse as well as Systane for symptomatic relief.  No cervical lymphadenopathy or increased fatigue recently.  No parotid swelling or  tenderness.  She has occasional oral ulcers but no nasal ulcerations.  She has not had any recent rashes, photosensitivity, or hair loss. Lab work from 02/27/2021 was reviewed today in the office: ESR WNL,, complements within normal limits, double-stranded DNA negative, and no proteinuria.  The following lab work will be updated today.  She will remain on Plaquenil as prescribed.  She was advised to notify us if she develops any new or worsening symptoms.  She will follow-up in the office in 5 months. - Plan: CBC with Differential/Platelet, COMPLETE METABOLIC PANEL WITH GFR, Urinalysis, Routine w reflex microscopic, ANA, C3 and C4, Sedimentation rate, Serum protein electrophoresis with reflex, Rheumatoid factor, Sjogrens syndrome-A extractable nuclear antibody, Sjogrens syndrome-B extractable nuclear antibody  High risk medication use - Plaquenil 200 mg 1 tablet by mouth twice daily Monday through Friday only. PLQ Eye Exam: 08/16/2020.  She was advised to call to schedule an updated Plaquenil eye examination.  CBC and CMP were updated on 02/27/2021.  CBC and CMP will be drawn today to monitor for drug toxicity.- Plan: CBC with  Differential/Platelet, COMPLETE METABOLIC PANEL WITH GFR  History of neutropenia - Stable. CBC with diff ordered today.  - Plan: CBC with Differential/Platelet  Primary osteoarthritis of both knees: She has good range of motion of both knee joints on examination today.  No warmth or effusion was noted.  Primary osteoarthritis of both feet: She is not experiencing any discomfort in her feet at this time.  Chronic SI joint pain - She presents today with increased discomfort in both SI joints especially the left SI.  She has been experiencing symptoms consistent with left-sided sciatica.  Her symptoms started 3 days ago with no injury or change in activity prior to the onset of symptoms.  She experiences intermittent symptoms of sciatica.  Her pain does not respond to over-the-counter products.  X-rays of the lumbar spine and pelvis were obtained today.  A prednisone taper starting at 20 mg tapering by 5 mg every 2 days was sent to the pharmacy.  She was advised to notify us if her symptoms persist or worsen.  Plan: XR Lumbar Spine 2-3 Views, XR Pelvis 1-2 Views  Acute left-sided low back pain with left-sided sciatica - She presents today with increased discomfort in her lower back with left-sided sciatica.  Her symptoms started 3 days ago with no change in activity, fall, or injury.  X-rays of the lumbar spine and pelvis were obtained today.  A prednisone taper was sent to the pharmacy.  Instructions were provided.  She was encouraged to take prednisone first thing in the morning with food and to avoid the use of NSAIDs concurrently.  She was advised to notify us if her symptoms persist or worsen.  We will call with updated x-ray results once Dr. Corliss Skains has interpreted these results.  plan: XR Lumbar Spine 2-3 Views  Other fatigue: Stable.   Language barrier: Interpreter was present throughout the entire exam.   Orders: Orders Placed This Encounter  Procedures   XR Lumbar Spine 2-3 Views   XR  Pelvis 1-2 Views   CBC with Differential/Platelet   COMPLETE METABOLIC PANEL WITH GFR   Urinalysis, Routine w reflex microscopic   ANA   C3 and C4   Sedimentation rate   Serum protein electrophoresis with reflex   Rheumatoid factor   Sjogrens syndrome-A extractable nuclear antibody   Sjogrens syndrome-B extractable nuclear antibody   Meds ordered this encounter  Medications   hydroxychloroquine (PLAQUENIL) 200  MG tablet    Sig: TAKE 1 TABLET BY MOUTH TWICE DAILY MONDAY THROUGH FRIDAY ONLY    Dispense:  120 tablet    Refill:  0   predniSONE (DELTASONE) 5 MG tablet    Sig: Take 4 tablets by mouth daily x 2 days, 3 tablets daily x2 days, 2 tablets daily x2 days, 1 tablet daily x2 days.    Dispense:  20 tablet    Refill:  0     Follow-Up Instructions: Return in about 5 months (around 01/17/2022) for Sjogren's syndrome.   Gearldine Bienenstock, PA-C  Note - This record has been created using Dragon software.  Chart creation errors have been sought, but may not always  have been located. Such creation errors do not reflect on  the standard of medical care.

## 2021-08-20 ENCOUNTER — Ambulatory Visit: Payer: Self-pay

## 2021-08-20 ENCOUNTER — Encounter: Payer: Self-pay | Admitting: Physician Assistant

## 2021-08-20 ENCOUNTER — Other Ambulatory Visit: Payer: Self-pay

## 2021-08-20 ENCOUNTER — Ambulatory Visit (INDEPENDENT_AMBULATORY_CARE_PROVIDER_SITE_OTHER): Payer: 59 | Admitting: Physician Assistant

## 2021-08-20 VITALS — BP 123/81 | HR 105 | Resp 15 | Ht 61.0 in | Wt 158.0 lb

## 2021-08-20 DIAGNOSIS — Z79899 Other long term (current) drug therapy: Secondary | ICD-10-CM | POA: Diagnosis not present

## 2021-08-20 DIAGNOSIS — Z789 Other specified health status: Secondary | ICD-10-CM

## 2021-08-20 DIAGNOSIS — M5442 Lumbago with sciatica, left side: Secondary | ICD-10-CM | POA: Diagnosis not present

## 2021-08-20 DIAGNOSIS — M19071 Primary osteoarthritis, right ankle and foot: Secondary | ICD-10-CM

## 2021-08-20 DIAGNOSIS — Z862 Personal history of diseases of the blood and blood-forming organs and certain disorders involving the immune mechanism: Secondary | ICD-10-CM | POA: Diagnosis not present

## 2021-08-20 DIAGNOSIS — M3509 Sicca syndrome with other organ involvement: Secondary | ICD-10-CM

## 2021-08-20 DIAGNOSIS — M170 Bilateral primary osteoarthritis of knee: Secondary | ICD-10-CM | POA: Diagnosis not present

## 2021-08-20 DIAGNOSIS — M533 Sacrococcygeal disorders, not elsewhere classified: Secondary | ICD-10-CM | POA: Diagnosis not present

## 2021-08-20 DIAGNOSIS — G8929 Other chronic pain: Secondary | ICD-10-CM | POA: Diagnosis not present

## 2021-08-20 DIAGNOSIS — R5383 Other fatigue: Secondary | ICD-10-CM

## 2021-08-20 DIAGNOSIS — M19072 Primary osteoarthritis, left ankle and foot: Secondary | ICD-10-CM

## 2021-08-20 MED ORDER — PREDNISONE 5 MG PO TABS: ORAL_TABLET | ORAL | 0 refills | Status: DC

## 2021-08-20 MED ORDER — HYDROXYCHLOROQUINE SULFATE 200 MG PO TABS: ORAL_TABLET | ORAL | 0 refills | Status: DC

## 2021-08-20 NOTE — Patient Instructions (Signed)
Back Exercises °These exercises help to make your trunk and back strong. They also help to keep the lower back flexible. Doing these exercises can help to prevent or lessen pain in your lower back. °If you have back pain, try to do these exercises 2-3 times each day or as told by your doctor. °As you get better, do the exercises once each day. Repeat the exercises more often as told by your doctor. °To stop back pain from coming back, do the exercises once each day, or as told by your doctor. °Do exercises exactly as told by your doctor. Stop right away if you feel sudden pain or your pain gets worse. °Exercises °Single knee to chest °Do these steps 3-5 times in a row for each leg: °Lie on your back on a firm bed or the floor with your legs stretched out. °Bring one knee to your chest. °Grab your knee or thigh with both hands and hold it in place. °Pull on your knee until you feel a gentle stretch in your lower back or butt. °Keep doing the stretch for 10-30 seconds. °Slowly let go of your leg and straighten it. °Pelvic tilt °Do these steps 5-10 times in a row: °Lie on your back on a firm bed or the floor with your legs stretched out. °Bend your knees so they point up to the ceiling. Your feet should be flat on the floor. °Tighten your lower belly (abdomen) muscles to press your lower back against the floor. This will make your tailbone point up to the ceiling instead of pointing down to your feet or the floor. °Stay in this position for 5-10 seconds while you gently tighten your muscles and breathe evenly. °Cat-cow °Do these steps until your lower back bends more easily: °Get on your hands and knees on a firm bed or the floor. Keep your hands under your shoulders, and keep your knees under your hips. You may put padding under your knees. °Let your head hang down toward your chest. Tighten (contract) the muscles in your belly. Point your tailbone toward the floor so your lower back becomes rounded like the back of a  cat. °Stay in this position for 5 seconds. °Slowly lift your head. Let the muscles of your belly relax. Point your tailbone up toward the ceiling so your back forms a sagging arch like the back of a cow. °Stay in this position for 5 seconds. ° °Press-ups °Do these steps 5-10 times in a row: °Lie on your belly (face-down) on a firm bed or the floor. °Place your hands near your head, about shoulder-width apart. °While you keep your back relaxed and keep your hips on the floor, slowly straighten your arms to raise the top half of your body and lift your shoulders. Do not use your back muscles. You may change where you place your hands to make yourself more comfortable. °Stay in this position for 5 seconds. Keep your back relaxed. °Slowly return to lying flat on the floor. ° °Bridges °Do these steps 10 times in a row: °Lie on your back on a firm bed or the floor. °Bend your knees so they point up to the ceiling. Your feet should be flat on the floor. Your arms should be flat at your sides, next to your body. °Tighten your butt muscles and lift your butt off the floor until your waist is almost as high as your knees. If you do not feel the muscles working in your butt and the back of   your thighs, slide your feet 1-2 inches (2.5-5 cm) farther away from your butt. °Stay in this position for 3-5 seconds. °Slowly lower your butt to the floor, and let your butt muscles relax. °If this exercise is too easy, try doing it with your arms crossed over your chest. °Belly crunches °Do these steps 5-10 times in a row: °Lie on your back on a firm bed or the floor with your legs stretched out. °Bend your knees so they point up to the ceiling. Your feet should be flat on the floor. °Cross your arms over your chest. °Tip your chin a little bit toward your chest, but do not bend your neck. °Tighten your belly muscles and slowly raise your chest just enough to lift your shoulder blades a tiny bit off the floor. Avoid raising your body  higher than that because it can put too much stress on your lower back. °Slowly lower your chest and your head to the floor. °Back lifts °Do these steps 5-10 times in a row: °Lie on your belly (face-down) with your arms at your sides, and rest your forehead on the floor. °Tighten the muscles in your legs and your butt. °Slowly lift your chest off the floor while you keep your hips on the floor. Keep the back of your head in line with the curve in your back. Look at the floor while you do this. °Stay in this position for 3-5 seconds. °Slowly lower your chest and your face to the floor. °Contact a doctor if: °Your back pain gets a lot worse when you do an exercise. °Your back pain does not get better within 2 hours after you exercise. °If you have any of these problems, stop doing the exercises. Do not do them again unless your doctor says it is okay. °Get help right away if: °You have sudden, very bad back pain. If this happens, stop doing the exercises. Do not do them again unless your doctor says it is okay. °This information is not intended to replace advice given to you by your health care provider. Make sure you discuss any questions you have with your health care provider. °Document Revised: 09/05/2020 Document Reviewed: 09/05/2020 °Elsevier Patient Education © 2022 [employer] ° °

## 2021-08-21 ENCOUNTER — Telehealth: Payer: Self-pay | Admitting: *Deleted

## 2021-08-21 DIAGNOSIS — Z79899 Other long term (current) drug therapy: Secondary | ICD-10-CM

## 2021-08-21 NOTE — Progress Notes (Signed)
X-rays of the lumbar spine were consistent with facet joint arthropathy.  Please notify the patient.

## 2021-08-21 NOTE — Progress Notes (Signed)
Ro antibody remains positive-stable. La antibody is negative.

## 2021-08-21 NOTE — Progress Notes (Signed)
X-rays of the pelvis revealed findings consistent with mild degenerative changes in the SI joints.

## 2021-08-21 NOTE — Progress Notes (Signed)
CMP WNL.  UA normal.  Complements WNL.  ESR WNL.  RF negative.  WBC count is low.  Absolute neutrophils are low.  Please advise the patient to recheck CBC with diff in 1 month.

## 2021-08-21 NOTE — Telephone Encounter (Signed)
-----  Message from Ofilia Neas, PA-C sent at 08/21/2021  8:01 AM EST ----- CMP WNL.  UA normal.  Complements WNL.  ESR WNL.  RF negative.  WBC count is low.  Absolute neutrophils are low.  Please advise the patient to recheck CBC with diff in 1 month.

## 2021-08-22 LAB — ANTI-NUCLEAR AB-TITER (ANA TITER)
ANA TITER: 1:1280 {titer} — ABNORMAL HIGH
ANA Titer 1: 1:1280 {titer} — ABNORMAL HIGH

## 2021-08-22 LAB — COMPLETE METABOLIC PANEL WITH GFR
AG Ratio: 1.4 (calc) (ref 1.0–2.5)
ALT: 20 U/L (ref 6–29)
AST: 24 U/L (ref 10–30)
Albumin: 4.3 g/dL (ref 3.6–5.1)
Alkaline phosphatase (APISO): 63 U/L (ref 31–125)
BUN: 9 mg/dL (ref 7–25)
CO2: 28 mmol/L (ref 20–32)
Calcium: 9.6 mg/dL (ref 8.6–10.2)
Chloride: 101 mmol/L (ref 98–110)
Creat: 0.76 mg/dL (ref 0.50–0.99)
Globulin: 3.1 g/dL (calc) (ref 1.9–3.7)
Glucose, Bld: 92 mg/dL (ref 65–99)
Potassium: 4.2 mmol/L (ref 3.5–5.3)
Sodium: 137 mmol/L (ref 135–146)
Total Bilirubin: 0.5 mg/dL (ref 0.2–1.2)
Total Protein: 7.4 g/dL (ref 6.1–8.1)
eGFR: 100 mL/min/{1.73_m2} (ref >=60)

## 2021-08-22 LAB — C3 AND C4
C3 Complement: 93 mg/dL (ref 83–193)
C4 Complement: 23 mg/dL (ref 15–57)

## 2021-08-22 LAB — CBC WITH DIFFERENTIAL/PLATELET
Absolute Monocytes: 459 cells/uL (ref 200–950)
Basophils Absolute: 50 cells/uL (ref 0–200)
Basophils Relative: 1.8 %
Eosinophils Absolute: 59 cells/uL (ref 15–500)
Eosinophils Relative: 2.1 %
HCT: 36.9 % (ref 35.0–45.0)
Hemoglobin: 12.4 g/dL (ref 11.7–15.5)
Lymphs Abs: 1196 cells/uL (ref 850–3900)
MCH: 32 pg (ref 27.0–33.0)
MCHC: 33.6 g/dL (ref 32.0–36.0)
MCV: 95.3 fL (ref 80.0–100.0)
MPV: 9.4 fL (ref 7.5–12.5)
Monocytes Relative: 16.4 %
Neutro Abs: 1036 cells/uL — ABNORMAL LOW (ref 1500–7800)
Neutrophils Relative %: 37 %
Platelets: 310 10*3/uL (ref 140–400)
RBC: 3.87 10*6/uL (ref 3.80–5.10)
RDW: 12.6 % (ref 11.0–15.0)
Total Lymphocyte: 42.7 %
WBC: 2.8 10*3/uL — ABNORMAL LOW (ref 3.8–10.8)

## 2021-08-22 LAB — URINALYSIS, ROUTINE W REFLEX MICROSCOPIC
Bilirubin Urine: NEGATIVE
Glucose, UA: NEGATIVE
Hgb urine dipstick: NEGATIVE
Ketones, ur: NEGATIVE
Leukocytes,Ua: NEGATIVE
Nitrite: NEGATIVE
Protein, ur: NEGATIVE
Specific Gravity, Urine: 1.004 (ref 1.001–1.035)
pH: 8 (ref 5.0–8.0)

## 2021-08-22 LAB — PROTEIN ELECTROPHORESIS, SERUM, WITH REFLEX
Albumin ELP: 4.5 g/dL (ref 3.8–4.8)
Alpha 1: 0.3 g/dL (ref 0.2–0.3)
Alpha 2: 0.6 g/dL (ref 0.5–0.9)
Beta 2: 0.5 g/dL (ref 0.2–0.5)
Beta Globulin: 0.5 g/dL (ref 0.4–0.6)
Gamma Globulin: 1.3 g/dL (ref 0.8–1.7)
Total Protein: 7.6 g/dL (ref 6.1–8.1)

## 2021-08-22 LAB — SJOGRENS SYNDROME-A EXTRACTABLE NUCLEAR ANTIBODY: SSA (Ro) (ENA) Antibody, IgG: >8 AI — AB

## 2021-08-22 LAB — SEDIMENTATION RATE: Sed Rate: 6 mm/h (ref 0–20)

## 2021-08-22 LAB — RHEUMATOID FACTOR: Rheumatoid fact SerPl-aCnc: <14 IU/mL (ref <14)

## 2021-08-22 LAB — SJOGRENS SYNDROME-B EXTRACTABLE NUCLEAR ANTIBODY: SSB (La) (ENA) Antibody, IgG: <1 AI

## 2021-08-22 LAB — ANA: Anti Nuclear Antibody (ANA): POSITIVE — AB

## 2021-08-22 NOTE — Progress Notes (Signed)
SPEP did not reveal any monoclonal proteins.   ANA is pending.

## 2021-08-23 NOTE — Progress Notes (Signed)
ANA remains positive but titer is unchanged.

## 2021-09-24 ENCOUNTER — Other Ambulatory Visit: Payer: Self-pay

## 2021-09-24 DIAGNOSIS — Z79899 Other long term (current) drug therapy: Secondary | ICD-10-CM

## 2021-09-24 LAB — CBC WITH DIFFERENTIAL/PLATELET
Absolute Monocytes: 385 cells/uL (ref 200–950)
Basophils Absolute: 81 cells/uL (ref 0–200)
Basophils Relative: 2.3 %
Eosinophils Absolute: 39 cells/uL (ref 15–500)
Eosinophils Relative: 1.1 %
HCT: 38.9 % (ref 35.0–45.0)
Hemoglobin: 13.1 g/dL (ref 11.7–15.5)
Lymphs Abs: 1260 cells/uL (ref 850–3900)
MCH: 32.2 pg (ref 27.0–33.0)
MCHC: 33.7 g/dL (ref 32.0–36.0)
MCV: 95.6 fL (ref 80.0–100.0)
MPV: 8.9 fL (ref 7.5–12.5)
Monocytes Relative: 11 %
Neutro Abs: 1736 cells/uL (ref 1500–7800)
Neutrophils Relative %: 49.6 %
Platelets: 287 10*3/uL (ref 140–400)
RBC: 4.07 10*6/uL (ref 3.80–5.10)
RDW: 12.9 % (ref 11.0–15.0)
Total Lymphocyte: 36 %
WBC: 3.5 10*3/uL — ABNORMAL LOW (ref 3.8–10.8)

## 2021-09-25 NOTE — Progress Notes (Signed)
White cell count is still low but improved at baseline.

## 2021-10-13 ENCOUNTER — Ambulatory Visit (HOSPITAL_COMMUNITY)
Admission: EM | Admit: 2021-10-13 | Discharge: 2021-10-13 | Disposition: A | Discharge status: Home / Self Care | Payer: 59 | Attending: Physician Assistant | Admitting: Physician Assistant

## 2021-10-13 ENCOUNTER — Ambulatory Visit (INDEPENDENT_AMBULATORY_CARE_PROVIDER_SITE_OTHER): Payer: 59

## 2021-10-13 ENCOUNTER — Other Ambulatory Visit: Payer: Self-pay

## 2021-10-13 ENCOUNTER — Encounter (HOSPITAL_COMMUNITY): Payer: Self-pay | Admitting: *Deleted

## 2021-10-13 DIAGNOSIS — J209 Acute bronchitis, unspecified: Secondary | ICD-10-CM

## 2021-10-13 DIAGNOSIS — R059 Cough, unspecified: Secondary | ICD-10-CM

## 2021-10-13 DIAGNOSIS — R051 Acute cough: Secondary | ICD-10-CM

## 2021-10-13 DIAGNOSIS — R0789 Other chest pain: Secondary | ICD-10-CM

## 2021-10-13 DIAGNOSIS — R062 Wheezing: Secondary | ICD-10-CM

## 2021-10-13 MED ORDER — ALBUTEROL SULFATE HFA 108 (90 BASE) MCG/ACT IN AERS
2.0000 | INHALATION_SPRAY | Freq: Once | RESPIRATORY_TRACT | Status: AC
  Administered 2021-10-13: 2 via RESPIRATORY_TRACT

## 2021-10-13 MED ORDER — ALBUTEROL SULFATE HFA 108 (90 BASE) MCG/ACT IN AERS
INHALATION_SPRAY | RESPIRATORY_TRACT | Status: AC
  Filled 2021-10-13: qty 6.7

## 2021-10-13 MED ORDER — PREDNISONE 20 MG PO TABS: 40.0000 mg | ORAL_TABLET | Freq: Every day | ORAL | 0 refills | Status: AC

## 2021-10-13 MED ORDER — PROMETHAZINE-DM 6.25-15 MG/5ML PO SYRP: 5.0000 mL | ORAL_SOLUTION | Freq: Three times a day (TID) | ORAL | 0 refills | Status: DC | PRN

## 2021-10-13 MED ORDER — METHYLPREDNISOLONE SODIUM SUCC 125 MG IJ SOLR
INTRAMUSCULAR | Status: AC
  Filled 2021-10-13: qty 2

## 2021-10-13 MED ORDER — METHYLPREDNISOLONE SODIUM SUCC 125 MG IJ SOLR
80.0000 mg | Freq: Once | INTRAMUSCULAR | Status: AC
  Administered 2021-10-13: 80 mg via INTRAMUSCULAR

## 2021-10-13 NOTE — Discharge Instructions (Signed)
Your x-ray was normal which is great news.  I think you have the beginning of a bronchitis.  This is typically caused by a virus and does not require antibiotics but if symptoms are not improving within a week please return for reevaluation.  We gave you an injection of steroids today.  Please start prednisone burst (40 mg for 4 days) tomorrow.  Do not take NSAIDs with this medication.  You can use Tylenol, Mucinex, Flonase for symptom relief.  Use Promethazine DM up to 3 times a day for cough.  This will make you sleepy so do not drive or drink alcohol with taking it.  Continue albuterol inhaler every 4-6 hours as needed.  Make sure to rest and drink plenty of fluid.  If symptoms or not improving please return for reevaluation.  If anything worsens and you have chest pain, shortness of breath, increased use of albuterol, nausea, vomiting, weakness you need to be seen immediately. ?

## 2021-10-13 NOTE — ED Provider Notes (Signed)
?[facility] ? ? ? ?CSN: [id number] ?Arrival date & time: 10/13/21  1035 ? ? ?  ? ?History   ?Chief Complaint ?Chief Complaint  ?Patient presents with  ? Cough  ? Headache  ? Generalized Body Aches  ? ? ?HPI ?[name] is a 46 y.o. female.  ? ?Patient presents today with a 5-day history of URI symptoms.  Reports severe cough, chest tightness, shortness of breath, headache.  She denies any nausea, vomiting, diarrhea, fever.  Denies any known sick contacts.  She has not had COVID but is fully vaccinated.  Denies any history of asthma, allergies, COPD.  She does not smoke.  She does have a history of autoimmune conditions and is on immune modulating medication including Plaquenil.  She does not use prednisone on a regular basis.  Denies any recent prednisone or antibiotic use.  She has tried multiple over-the-counter medications without improvement of symptoms.  She is having difficulty with daily activities as result of symptoms. ? ? ?Past Medical History:  ?Diagnosis Date  ? Abnormal uterine bleeding   ? History of chicken pox   ? Sjogren's disease ([place])   ? Urinary tract infection 12/2014  ? Vaginal Pap smear, abnormal   ? ? ?Patient Active Problem List  ? Diagnosis Date Noted  ? 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 Perimenopausal symptoms 05/17/2021  ? Popliteal cyst, left 12/13/2019  ? Depression, major, single episode, moderate ([place]) 07/25/2019  ? Autoimmune disorder ([place]) 03/25/2018  ? Neutropenia ([place]) 01/04/2018  ? 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 ? ?Past Surgical History:  ?Procedure Laterality Date  ? CESAREAN SECTION    ? LEEP    ? ? ?OB History   ? ? Gravida  ?5  ? Para  ?3  ? Term  ?3  ? Preterm  ?   ? AB  ?2  ? Living  ?3  ?  ? ? SAB  ?1  ? IAB  ?1  ? Ectopic  ?   ? Multiple  ?   ? Live Births  ?   ?   ?  ?  ? ? ? ?Home Medications   ? ?Prior to Admission medications   ?Medication Sig Start Date End Date Taking? Authorizing Provider  ?predniSONE (DELTASONE)  20 MG tablet Take 2 tablets (40 mg total) by mouth daily for 4 days. 10/13/21 10/17/21 Yes Aqib Lough K, PA-C  ?promethazine-dextromethorphan (PROMETHAZINE-DM) 6.25-15 MG/5ML syrup Take 5 mLs by mouth 3 (three) times daily as needed for cough. 10/13/21  Yes Adelise Buswell, Junie Panning K, PA-C  ?DULoxetine (CYMBALTA) 60 MG capsule TAKE ONE CAPSULE BY MOUTH EVERY DAY 11/14/20   Orma Flaming, MD  ?gabapentin (NEURONTIN) 300 MG capsule Take 1 capsule (300 mg total) by mouth 2 (two) times daily. 1 capsule in morning, 2 capsules in the evening. 06/07/21   Jeanie Sewer, NP  ?hydroxychloroquine (PLAQUENIL) 200 MG tablet TAKE 1 TABLET BY MOUTH TWICE DAILY MONDAY THROUGH FRIDAY ONLY 08/20/21   Ofilia Neas, PA-C  ?Multiple Vitamin (MULTIVITAMIN) tablet Take 1 tablet by mouth daily.    [provider]  ?norethindrone-ethinyl estradiol (LOESTRIN 1/20, 21,) 1-20 MG-MCG tablet Take 1 tablet by mouth daily. ?Patient not taking: Reported on 08/20/2021 05/17/21   Jeanie Sewer, NP  ?sucralfate (CARAFATE) 1 g tablet 1 tablet on an empty stomach ?Patient not taking: Reported on 08/20/2021    [provider]  ?vitamin C (ASCORBIC ACID) 500 MG tablet Take 500 mg by mouth  daily.    [provider]  ? ? ?Family History ?Family History  ?Problem Relation Age of Onset  ? Diabetes Mother   ? Hypertension Mother   ? High Cholesterol Mother   ? Healthy Sister   ? Healthy Brother   ? Healthy Brother   ? Breast cancer Paternal Grandmother   ? Healthy Son   ? Healthy Son   ? Healthy Son   ? ? ?Social History ?Social History  ? ?Tobacco Use  ? Smoking status: Some Days  ?  Types: Cigarettes  ? Smokeless tobacco: Never  ?Vaping Use  ? Vaping Use: Never used  ?Substance Use Topics  ? Alcohol use: Yes  ?  Alcohol/week: 2.0 standard drinks  ?  Types: 2 Cans of beer per week  ?  Comment: occ  ? Drug use: No  ? ? ? ?Allergies   ?Patient has no known allergies. ? ? ?Review of Systems ?Review of Systems  ?Constitutional:  Positive for  activity change and fatigue. Negative for appetite change and fever.  ?HENT:  Positive for congestion. Negative for sneezing and sore throat.   ?Respiratory:  Positive for cough, chest tightness and shortness of breath.   ?Cardiovascular:  Negative for chest pain.  ?Gastrointestinal:  Negative for abdominal pain, diarrhea, nausea and vomiting.  ?Musculoskeletal:  Negative for arthralgias and myalgias.  ?Neurological:  Positive for headaches. Negative for dizziness and light-headedness.  ? ? ?Physical Exam ?Triage Vital Signs ?ED Triage Vitals  ?Enc Vitals Group  ?   BP 10/13/21 1057 117/83  ?   Pulse Rate 10/13/21 1057 88  ?   Resp 10/13/21 1057 18  ?   Temp 10/13/21 1057 99.1 ?F (37.3 ?C)  ?   Temp src --   ?   SpO2 10/13/21 1057 99 %  ?   Weight --   ?   Height --   ?   Head Circumference --   ?   Peak Flow --   ?   Pain Score 10/13/21 1054 7  ?   Pain Loc --   ?   Pain Edu? --   ?   Excl. in [place]? --   ? ?No data found. ? ?Updated Vital Signs ?BP 117/83   Pulse 88   Temp 99.1 ?F (37.3 ?C)   Resp 18   LMP 10/06/2021   SpO2 99%  ? ?Visual Acuity ?Right Eye Distance:   ?Left Eye Distance:   ?Bilateral Distance:   ? ?Right Eye Near:   ?Left Eye Near:    ?Bilateral Near:    ? ?Physical Exam ?Vitals reviewed.  ?Constitutional:   ?   General: She is awake. She is not in acute distress. ?   Appearance: Normal appearance. She is well-developed. She is not ill-appearing.  ?   Comments: Very pleasant female appears stated age in no acute distress sitting comfortably on exam room table  ?HENT:  ?   Head: Normocephalic and atraumatic.  ?   Right Ear: Tympanic membrane, ear canal and external ear normal. Tympanic membrane is not erythematous or bulging.  ?   Left Ear: Tympanic membrane, ear canal and external ear normal. Tympanic membrane is not erythematous or bulging.  ?   Nose:  ?   Right Sinus: No maxillary sinus tenderness or frontal sinus tenderness.  ?   Left Sinus: No maxillary sinus tenderness or frontal sinus  tenderness.  ?   Mouth/Throat:  ?   Pharynx: Uvula midline. No  oropharyngeal exudate or posterior oropharyngeal erythema.  ?Cardiovascular:  ?   Rate and Rhythm: Normal rate and regular rhythm.  ?   Heart sounds: Normal heart sounds, S1 normal and S2 normal. No murmur heard. ?Pulmonary:  ?   Effort: Pulmonary effort is normal.  ?   Breath sounds: Wheezing present. No rhonchi or rales.  ?   Comments: Scattered wheezing with reactive cough ?Psychiatric:     ?   Behavior: Behavior is cooperative.  ? ? ? ?UC Treatments / Results  ?Labs ?(all labs ordered are listed, but only abnormal results are displayed) ?Labs Reviewed - No data to display ? ?EKG ? ? ?Radiology ?DG Chest 2 View ? ?Result Date: 10/13/2021 ?CLINICAL DATA:  46 year old female with history of worsening cough and generalized body aches. EXAM: CHEST - 2 VIEW COMPARISON:  Chest x-ray 02/17/2018. FINDINGS: Lung volumes are normal. No consolidative airspace disease. No pleural effusions. No pneumothorax. No pulmonary nodule or mass noted. Pulmonary vasculature and the cardiomediastinal silhouette are within normal limits. IMPRESSION: No radiographic evidence of acute cardiopulmonary disease. Electronically Signed   By: Vinnie Langton M.D.   On: 10/13/2021 12:08   ? ?Procedures ?Procedures (including critical care time) ? ?Medications Ordered in UC ?Medications  ?albuterol (VENTOLIN HFA) 108 (90 Base) MCG/ACT inhaler 2 puff (2 puffs Inhalation Given 10/13/21 1224)  ?methylPREDNISolone sodium succinate (SOLU-MEDROL) 125 mg/2 mL injection 80 mg (80 mg Intramuscular Given 10/13/21 1224)  ? ? ?Initial Impression / Assessment and Plan / UC Course  ?I have reviewed the triage vital signs and the nursing notes. ? ?Pertinent labs & imaging results that were available during my care of the patient were reviewed by me and considered in my medical decision making (see chart for details). ? ?  ? ?Patient is well-appearing, afebrile, nontoxic, nontachycardic.  No indication for  viral testing as patient has been symptomatic for 5 days and this would not change management.  Chest x-ray was obtained which showed no acute cardiopulmonary disease.  She was given Solu-Medrol and albuterol

## 2021-10-13 NOTE — ED Triage Notes (Signed)
Pt 's Sx's started on Tuesday. PT has cough and general body aches. ?

## 2021-10-18 ENCOUNTER — Encounter: Payer: Self-pay | Admitting: Family

## 2021-10-18 ENCOUNTER — Ambulatory Visit (INDEPENDENT_AMBULATORY_CARE_PROVIDER_SITE_OTHER): Payer: Commercial Managed Care - PPO | Admitting: Family

## 2021-10-18 VITALS — BP 123/86 | HR 90 | Temp 98.5°F | Ht 61.0 in | Wt 158.5 lb

## 2021-10-18 DIAGNOSIS — R1111 Vomiting without nausea: Secondary | ICD-10-CM | POA: Insufficient documentation

## 2021-10-18 DIAGNOSIS — R1013 Epigastric pain: Secondary | ICD-10-CM

## 2021-10-18 DIAGNOSIS — R519 Headache, unspecified: Secondary | ICD-10-CM | POA: Diagnosis not present

## 2021-10-18 DIAGNOSIS — R42 Dizziness and giddiness: Secondary | ICD-10-CM

## 2021-10-18 HISTORY — DX: Epigastric pain: R10.13

## 2021-10-18 HISTORY — DX: Dizziness and giddiness: R42

## 2021-10-18 MED ORDER — KETOROLAC TROMETHAMINE 60 MG/2ML IM SOLN
60.0000 mg | Freq: Once | INTRAMUSCULAR | Status: AC
  Administered 2021-10-18: 60 mg via INTRAMUSCULAR

## 2021-10-18 MED ORDER — MECLIZINE HCL 12.5 MG PO TABS: 12.5000 mg | ORAL_TABLET | Freq: Three times a day (TID) | ORAL | 0 refills | Status: DC | PRN

## 2021-10-18 NOTE — Progress Notes (Signed)
? ?Subjective:  ? ? ? Patient ID: [name], female    DOB: [birth year], 46 y.o.   MRN: [id number] ? ?Chief Complaint  ?Patient presents with  ? Migraine  ?  Pt c/o a head ache everyday and feels a lot of pressure behind eyes. Light sensitivity. Has tried excedrine migraine, OTC, which does help sometimes.   ? ?HPI: ?Headache: Patient complains of headache. She does have a headache at this time. Location of pain: retro-orbital Radiation of pain?:bilateral. Character of pain:pulsating, sharp, stabbing, and throbbing. Accompanying symptoms: nausea, dizziness. Prodromal sx?: none. Rapidity of onset: gradual. Typical duration of individual headache:  days. Are most headaches similar in presentation? yes Typical precipitants: coughing. Started having HAs 2 weeks ago. Worst time of day: hurts all day. History of head/neck trauma? no. Use of meds that might worsen HAs? no. Exposure to carbon monoxide? no. Caffeine use: mild. Reports having bronchitis 2 weeks ago and though the HA was from so much coughing, which made it worse. Also occurs while driving. ? ?Health Maintenance Due  ?Topic Date Due  ? COVID-19 Vaccine (3 - Pfizer risk series) 09/18/2020  ? MAMMOGRAM  10/04/2020  ? ? ?Past Medical History:  ?Diagnosis Date  ? Abnormal uterine bleeding   ? History of chicken pox   ? Sjogren's disease ([facility])   ? Urinary tract infection 12/2014  ? Vaginal Pap smear, abnormal   ? ? ?Past Surgical History:  ?Procedure Laterality Date  ? CESAREAN SECTION    ? LEEP    ? ? ?Outpatient Medications Prior to Visit  ?Medication Sig Dispense Refill  ? aspirin-acetaminophen-caffeine (EXCEDRIN MIGRAINE) 250-250-65 MG tablet Take 2 tablets by mouth every 6 (six) hours as needed for headache.    ? DULoxetine (CYMBALTA) 60 MG capsule TAKE ONE CAPSULE BY MOUTH EVERY DAY 90 capsule 1  ? hydroxychloroquine (PLAQUENIL) 200 MG tablet TAKE 1 TABLET BY MOUTH TWICE DAILY MONDAY THROUGH FRIDAY ONLY 120 tablet 0  ? Multiple Vitamin  (MULTIVITAMIN) tablet Take 1 tablet by mouth daily.    ? promethazine-dextromethorphan (PROMETHAZINE-DM) 6.25-15 MG/5ML syrup Take 5 mLs by mouth 3 (three) times daily as needed for cough. 118 mL 0  ? vitamin C (ASCORBIC ACID) 500 MG tablet Take 500 mg by mouth daily.    ? norethindrone-ethinyl estradiol (LOESTRIN 1/20, 21,) 1-20 MG-MCG tablet Take 1 tablet by mouth daily. (Patient not taking: Reported on 10/18/2021) 28 tablet 11  ? sucralfate (CARAFATE) 1 g tablet 1 tablet on an empty stomach (Patient not taking: Reported on 10/18/2021)    ? gabapentin (NEURONTIN) 300 MG capsule Take 1 capsule (300 mg total) by mouth 2 (two) times daily. 1 capsule in morning, 2 capsules in the evening. (Patient not taking: Reported on 10/18/2021) 90 capsule 0  ? ?No facility-administered medications prior to visit.  ? ? ?No Known Allergies ? ?   ?Objective:  ?  ?Physical Exam ?Vitals and nursing note reviewed.  ?Constitutional:   ?   Appearance: Normal appearance.  ?HENT:  ?   Right Ear: Tympanic membrane and ear canal normal.  ?   Left Ear: Tympanic membrane and ear canal normal.  ?Eyes:  ?   General:     ?   Right eye: No discharge.     ?   Left eye: No discharge.  ?   Extraocular Movements:  ?   Right eye: Abnormal extraocular motion (dizziness w/exam) present. No nystagmus.  ?   Left eye: Abnormal extraocular motion (dizziness w/exam) present. No  nystagmus.  ?   Conjunctiva/sclera: Conjunctivae normal.  ?Cardiovascular:  ?   Rate and Rhythm: Normal rate and regular rhythm.  ?Pulmonary:  ?   Effort: Pulmonary effort is normal.  ?   Breath sounds: Normal breath sounds.  ?Musculoskeletal:     ?   General: Normal range of motion.  ?Skin: ?   General: Skin is warm and dry.  ?Neurological:  ?   Mental Status: She is alert.  ?Psychiatric:     ?   Mood and Affect: Mood normal.     ?   Behavior: Behavior normal.  ? ? ?BP 123/86 (BP Location: Left Arm, Patient Position: Sitting, Cuff Size: Large)   Pulse 90   Temp 98.5 ?F (36.9 ?C)  (Temporal)   Ht 5\' 1"  (1.549 m)   Wt 158 lb 8 oz (71.9 kg)   LMP 10/06/2021   SpO2 100%   BMI 29.95 kg/m?  ?Wt Readings from Last 3 Encounters:  ?10/18/21 158 lb 8 oz (71.9 kg)  ?08/20/21 158 lb (71.7 kg)  ?06/07/21 157 lb 6.4 oz (71.4 kg)  ? ?   ?Assessment & Plan:  ? ?Problem List Items Addressed This Visit   ? ?  ? Other  ? Vertigo  ?  new - believe brought on by recent viral illness, also having HA & nausea, sending Meclizine, advised on use & SE, hydration up to 2L/d of water. ? ?  ?  ? Relevant Medications  ? meclizine (ANTIVERT) 12.5 MG tablet  ? ?Other Visit Diagnoses   ? ? New onset headache    -  Primary  ? most likely d/t vertigo, recent bronchitis. Giving Toradol today, ok to continue Excedrin graine OTC prn, but advised to let me know if still taking qd. ? ?Relevant Medications  ? aspirin-acetaminophen-caffeine (EXCEDRIN MIGRAINE) 250-250-65 MG tablet  ? ketorolac (TORADOL) injection 60 mg (Completed)  ? ?  ? ?Meds ordered this encounter  ?Medications  ? meclizine (ANTIVERT) 12.5 MG tablet  ?  Sig: Take 1 tablet (12.5 mg total) by mouth 3 (three) times daily as needed for dizziness.  ?  Dispense:  30 tablet  ?  Refill:  0  ?  Order Specific Question:   Supervising Provider  ?  Answer:   ANDY, CAMILLE L [[id number]]  ? ketorolac (TORADOL) injection 60 mg  ? ? ?Dulce Sellar, NP ? ?

## 2021-10-18 NOTE — Patient Instructions (Signed)
It was very nice to see you today! ? ?You received a Toradol injection today for your headache.   ?I have sent Meclizine to your pharmacy to help your dizziness and nausea. ?OK to keep taking the Excedrin migraine medication, but if your headache persists, let me know. ?Be sure you are drinking at least 2 liters of water daily, don't go long periods without eating, sleep at least 7 hours per night and limit your stress to prevent future headaches. ? ? ? ?PLEASE NOTE: ? ?If you had any lab tests please let us know if you have not heard back within a few days. You may see your results on MyChart before we have a chance to review them but we will give you a call once they are reviewed by [place]. If we ordered any referrals today, please let us know if you have not heard from their office within the next week.  ? ?Please try these tips to maintain a healthy lifestyle: ? ?Eat most of your calories during the day when you are active. Eliminate processed foods including packaged sweets (pies, cakes, cookies), reduce intake of potatoes, white bread, white pasta, and white rice. Look for whole grain options, oat flour or almond flour. ? ?Each meal should contain half fruits/vegetables, one quarter protein, and one quarter carbs (no bigger than a computer mouse). ? ?Cut down on sweet beverages. This includes juice, soda, and sweet tea. Also watch fruit intake, though this is a healthier sweet option, it still contains natural sugar! Limit to 3 servings daily. ? ?Drink at least 1 glass of water with each meal and aim for at least 8 glasses per day ? ?Exercise at least 150 minutes every week.  ? ?

## 2021-10-20 NOTE — Assessment & Plan Note (Signed)
new - believe brought on by recent viral illness, also having HA & nausea, sending Meclizine, advised on use & SE, hydration up to 2L/d of water. ?

## 2021-10-31 ENCOUNTER — Other Ambulatory Visit: Payer: Self-pay | Admitting: Physician Assistant

## 2021-10-31 NOTE — Telephone Encounter (Signed)
Next Visit: 01/21/2022 ? ?Last Visit: 08/20/2021 ? ?Labs: 2/14/2023CMP WNL.  UA normal.  Complements WNL.  ESR WNL.  RF negative.  WBC count is low.  Absolute neutrophils are low.    09/24/2021 White cell count is still low but improved at baseline. ? ?Eye exam:  08/16/2020 WNL  ? ?Current Dose per office note 08/20/2021: Plaquenil 200 mg 1 tablet by mouth twice daily Monday through Friday only ? ?NP:[id number]'B syndrome with other organ involvement  ? ?Last Fill: 08/20/2021 ? ?Patient advised she is due to update her PLQ eye exam. Patient states she updated it 2 months ago and will call the eye doctor to have them send the report.  ? ?Okay to refill Plaquenil?  ?

## 2021-12-04 ENCOUNTER — Other Ambulatory Visit: Payer: Self-pay | Admitting: *Deleted

## 2021-12-04 MED ORDER — HYDROXYCHLOROQUINE SULFATE 200 MG PO TABS: ORAL_TABLET | ORAL | 0 refills | Status: DC

## 2021-12-04 NOTE — Telephone Encounter (Signed)
Next Visit: 01/21/2022  Last Visit: 08/20/2021  Labs: 2/14/2023CMP WNL.  UA normal.  Complements WNL.  ESR WNL.  RF negative.  WBC count is low.  Absolute neutrophils are low.    09/24/2021 White cell count is still low but improved at baseline.  Eye exam: 08/22/2021 WNL   Current Dose per office note 08/20/2021:  Plaquenil 200 mg 1 tablet by mouth twice daily Monday through Friday only  JQ:[id number]'T syndrome with other organ involvement   Okay to refill Plaquenil?

## 2021-12-06 ENCOUNTER — Encounter: Payer: Self-pay | Admitting: Rheumatology

## 2021-12-06 ENCOUNTER — Other Ambulatory Visit: Payer: Self-pay | Admitting: Rheumatology

## 2022-01-08 NOTE — Progress Notes (Signed)
Office Visit Note  Patient: [name]             Date of Birth: 23-Dec-1975           MRN: [id number]             PCP: Dulce Sellar, NP Referring: Dulce Sellar, NP Visit Date: 01/21/2022 Occupation: @GUAROCC @  Interpreter: Fabian November  Subjective:  Medication management  History of Present Illness: [name] is a 46 y.o. female with history of Sjogren's and osteoarthritis.  She states she continues to have dry mouth and dry eyes.  She also complains of fatigue, and photosensitivity.  She denies any history of joint swelling.  She has discomfort in her left knee joint and some popping sensation.  She has been taking hydroxychloroquine 200 mg 1 tablet p.o. twice daily Monday to Friday without any side effects.  Activities of Daily Living:  Patient reports morning stiffness for less than 1 minute.   Patient Reports nocturnal pain.  Difficulty dressing/grooming: Denies Difficulty climbing stairs: Denies Difficulty getting out of chair: Denies Difficulty using hands for taps, buttons, cutlery, and/or writing: Reports  Review of Systems  Constitutional:  Positive for fatigue.  HENT:  Positive for mouth dryness.   Eyes:  Positive for dryness.  Respiratory:  Negative for shortness of breath.   Cardiovascular:  Negative for chest pain and palpitations.  Gastrointestinal:  Positive for constipation. Negative for blood in stool and diarrhea.  Endocrine: Negative for increased urination.  Genitourinary:  Negative for involuntary urination.  Musculoskeletal:  Positive for joint pain, joint pain, myalgias, morning stiffness, muscle tenderness and myalgias. Negative for joint swelling and muscle weakness.  Skin:  Positive for sensitivity to sunlight. Negative for color change, rash and hair loss.  Allergic/Immunologic: Negative for susceptible to infections.  Neurological:  Positive for headaches. Negative for dizziness.  Hematological:  Negative  for swollen glands.  Psychiatric/Behavioral:  Positive for sleep disturbance. Negative for depressed mood. The patient is nervous/anxious.     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    Past Medical History:  Diagnosis Date   Abnormal uterine bleeding    History of chicken pox    Sjogren's disease (HCC)    Urinary tract infection 12/2014   Vaginal Pap smear, abnormal     Family History  Problem Relation Age of Onset   Diabetes Mother    Hypertension Mother    High Cholesterol Mother    Healthy Sister    Healthy Brother    Healthy Brother    Breast cancer Paternal Grandmother    Healthy Son    Healthy Son    Healthy Son    Past Surgical History:  Procedure Laterality Date   CESAREAN SECTION     LEEP     Social History   Social History Narrative   Not on file   Immunization History  Administered Date(s) Administered   Influenza,inj,Quad PF,6+ Mos 06/24/2018, 04/08/2019, 07/25/2020, 05/17/2021   PFIZER(Purple Top)SARS-COV-2 Vaccination 07/31/2020, 08/21/2020   Pneumococcal Polysaccharide-23 03/25/2018   Tdap 11/23/2014     Objective: Vital Signs: BP 121/81 (BP Location: Left Arm, Patient Position: Sitting,  Cuff Size: Normal)   Pulse 92   Ht 5\' 1"  (1.549 m)   Wt 161 lb 12.8 oz (73.4 kg)   BMI 30.57 kg/m    Physical Exam Vitals and nursing note reviewed.  Constitutional:      Appearance: She is well-developed.  HENT:     Head: Normocephalic and atraumatic.  Eyes:     Conjunctiva/sclera: Conjunctivae normal.  Cardiovascular:     Rate and Rhythm: Normal rate and regular rhythm.      Heart sounds: Normal heart sounds.  Pulmonary:     Effort: Pulmonary effort is normal.     Breath sounds: Normal breath sounds.  Abdominal:     General: Bowel sounds are normal.     Palpations: Abdomen is soft.  Musculoskeletal:     Cervical back: Normal range of motion.  Lymphadenopathy:     Cervical: No cervical adenopathy.  Skin:    General: Skin is warm and dry.     Capillary Refill: Capillary refill takes less than 2 seconds.  Neurological:     Mental Status: She is alert and oriented to person, place, and time.  Psychiatric:        Behavior: Behavior normal.      Musculoskeletal Exam: C-spine was in good range of motion.  There was no tenderness over thoracic or lumbar spine.  Shoulder joints, elbow joints, wrist joints, MCPs PIPs and DIPs with good range of motion with no synovitis.  Hip joints, knee joints, ankles, MTPs and PIPs with good range of motion with no synovitis.  CDAI Exam: CDAI Score: -- Patient Global: --; Provider Global: -- Swollen: --; Tender: -- Joint Exam 01/21/2022   No joint exam has been documented for this visit   There is currently no information documented on the homunculus. Go to the Rheumatology activity and complete the homunculus joint exam.  Investigation: No additional findings.  Imaging: No results found.  Recent Labs: Lab Results  Component Value Date   WBC 3.5 (L) 09/24/2021   HGB 13.1 09/24/2021   PLT 287 09/24/2021   NA 137 08/20/2021   K 4.2 08/20/2021   CL 101 08/20/2021   CO2 28 08/20/2021   GLUCOSE 92 08/20/2021   BUN 9 08/20/2021   CREATININE 0.76 08/20/2021   BILITOT 0.5 08/20/2021   ALKPHOS 62 08/09/2020   AST 24 08/20/2021   ALT 20 08/20/2021   PROT 7.4 08/20/2021   PROT 7.6 08/20/2021   ALBUMIN 4.4 08/09/2020   CALCIUM 9.6 08/20/2021   GFRAA 125 07/11/2020    Speciality Comments: PLQ Eye Exam:08/22/2021 WNL @ [facility] Follow up in 1 year  Procedures:  No procedures performed Allergies:  Patient has no known allergies.   Assessment / Plan:     Visit Diagnoses: Sjogren's syndrome with other organ involvement (HCC) - ANA 1:1280, Ro>8, Sicca sx, arthralgias, neutropenia, hypocomplementemia:  -She complains of dry mouth and dry eyes.  She denies any history of oral ulcers, nasal ulcers, malar rash, photosensitivity or Raynaud's phenomenon.  Over-the-counter products were discussed at length.  Plan: Urinalysis, Routine w reflex microscopic  High risk medication use - Plaquenil 200 mg 1 tablet by mouth twice daily Monday through Friday only. PLQ Eye Exam:08/22/2021 - Plan: CBC with Differential/Platelet, COMPLETE METABOLIC PANEL WITH GFR today and every 5 months.  History of neutropenia-her white cell count continues to be low but is stable.  Primary osteoarthritis of both knees-she complains of a stiffness in her left knee joint and popping sensation.  No warmth swelling or effusion was noted.  Lower extremity muscle strengthening exercises were demonstrated.  Primary osteoarthritis of both feet-proper fitting shoes were discussed.  Chronic SI joint pain-she had no SI joint tenderness although she complains of lower back pain.  Chronic left-sided low back pain with left-sided sciatica-she states that her lower back pain is exacerbated by increased activity.  She is working on a light duty schedule currently.  Other fatigue-she continues to have fatigue.  Language barrier-interpreter was present during the visit.  Orders: Orders Placed This Encounter  Procedures   CBC with Differential/Platelet   COMPLETE METABOLIC PANEL WITH GFR   Urinalysis, Routine w reflex microscopic   No orders of the defined types were placed in this encounter.   Follow-Up Instructions: Return in about 5 months (around 06/23/2022) for Sjogren's, Osteoarthritis.   Pollyann Savoy, MD  Note - This record has been created using Animal nutritionist.  Chart creation errors have been sought, but may not  always  have been located. Such creation errors do not reflect on  the standard of medical care.

## 2022-01-21 ENCOUNTER — Ambulatory Visit (INDEPENDENT_AMBULATORY_CARE_PROVIDER_SITE_OTHER): Payer: Commercial Managed Care - PPO | Admitting: Rheumatology

## 2022-01-21 ENCOUNTER — Encounter: Payer: Self-pay | Admitting: Rheumatology

## 2022-01-21 VITALS — BP 121/81 | HR 92 | Ht 61.0 in | Wt 161.8 lb

## 2022-01-21 DIAGNOSIS — G8929 Other chronic pain: Secondary | ICD-10-CM

## 2022-01-21 DIAGNOSIS — M5442 Lumbago with sciatica, left side: Secondary | ICD-10-CM

## 2022-01-21 DIAGNOSIS — M3509 Sicca syndrome with other organ involvement: Secondary | ICD-10-CM | POA: Diagnosis not present

## 2022-01-21 DIAGNOSIS — M533 Sacrococcygeal disorders, not elsewhere classified: Secondary | ICD-10-CM

## 2022-01-21 DIAGNOSIS — Z862 Personal history of diseases of the blood and blood-forming organs and certain disorders involving the immune mechanism: Secondary | ICD-10-CM

## 2022-01-21 DIAGNOSIS — Z79899 Other long term (current) drug therapy: Secondary | ICD-10-CM

## 2022-01-21 DIAGNOSIS — M170 Bilateral primary osteoarthritis of knee: Secondary | ICD-10-CM | POA: Diagnosis not present

## 2022-01-21 DIAGNOSIS — R5383 Other fatigue: Secondary | ICD-10-CM

## 2022-01-21 DIAGNOSIS — M19072 Primary osteoarthritis, left ankle and foot: Secondary | ICD-10-CM

## 2022-01-21 DIAGNOSIS — Z789 Other specified health status: Secondary | ICD-10-CM

## 2022-01-21 DIAGNOSIS — M19071 Primary osteoarthritis, right ankle and foot: Secondary | ICD-10-CM

## 2022-01-21 NOTE — Patient Instructions (Addendum)
Vaccines You are taking a medication(s) that can suppress your immune system.  The following immunizations are recommended: Flu annually Covid-19  Td/Tdap (tetanus, diphtheria, pertussis) every 10 years Pneumonia (Prevnar 15 then Pneumovax 23 at least 1 year apart.  Alternatively, can take Prevnar 20 without needing additional dose) Shingrix: 2 doses from 4 weeks to 6 months apart  Please check with your PCP to make sure you are up to date.   Ejercicios para la espalda Back Exercises Los siguientes ejercicios fortalecen los msculos que dan soporte al tronco (torso) y a Scientific laboratory technician. Adems, ayudan a mantener la flexibilidad de la zona lumbar. Hacer estos ejercicios puede ser de ayuda para evitar o Advertising copywriter. Si tiene dolor o [employer] espalda, intente hacer estos ejercicios 2 o 3 veces por da, o como se lo haya indicado el mdico. A medida que el dolor desaparece, hgalos una vez por da, pero aumente la cantidad de veces que repite los pasos para cada ejercicio (haga ms repeticiones). Para prevenir la recurrencia del dolor de espalda, contine haciendo estos ejercicios una vez al da o como se lo haya indicado el mdico. Haga los ejercicios exactamente como se lo haya indicado el mdico y gradelos como se lo hayan indicado. Es normal sentir un leve estiramiento, tirn, rigidez o molestia cuando haga estos ejercicios, pero debe detenerse de inmediato si siente un dolor repentino o si el dolor empeora. Ejercicios Rodilla al pecho Repita estos pasos 3 a 5 veces con cada pierna: Acustese boca arriba sobre una cama dura o sobre el suelo con las piernas extendidas. Lleve una rodilla al pecho. La otra pierna debe quedar extendida y en contacto con el suelo. Mantenga la rodilla contra el pecho al tomarse la rodilla o el muslo con ambas manos y Consulting civil engineer. Tire de la rodilla hasta sentir una elongacin suave en la parte baja de la espalda o las nalgas. Mantenga la elongacin  durante 10 a 30 segundos. Suelte y extienda la pierna lentamente.  Inclinacin de la pelvis Repita estos pasos 5 a 10 veces: Acustese boca arriba sobre una cama dura o sobre el suelo con las piernas extendidas. Flexione las rodillas de modo que apunten al techo y los pies queden apoyados en el suelo. Contraiga los msculos de la parte baja del abdomen para empujar la zona lumbar contra el suelo. Con este movimiento se inclinar la pelvis de modo que el coxis apunte hacia el techo, en lugar de apuntar a los pies o al suelo. Contraiga suavemente y respire con normalidad mientras mantiene esta posicin durante 5 a 10 segundos.  El perro y el gato Repita estos pasos hasta que la zona lumbar se vuelva ms flexible: Apoye las palmas de las manos y las rodillas sobre una cama firme o el suelo. Las manos deben estar alineadas con los hombros y las rodillas con las caderas. Puede colocarse almohadillas debajo de las rodillas para estar cmodo. Deje que la cabeza cuelgue hacia el pecho. Contraiga los msculos abdominales y baje el coxis en direccin al suelo de modo que la zona lumbar se arquee como el lomo de un [place]. Mantenga esta posicin durante 5 segundos. Lentamente, levante la cabeza, relaje los msculos abdominales y eleve el coxis de modo que apunte en direccin al techo para que la espalda forme un arco hundido como el lomo de un perro contento. Mantenga esta posicin durante 5 segundos.  Flexiones de Public house manager pasos 5 a 10 veces: Acustese [employer]  abdomen (boca abajo) en una cama firme o en el suelo. Coloque las palmas de las manos cerca de la cabeza, separadas aproximadamente al ancho de los hombros. Con la espalda lo ms relajada posible y las caderas apoyadas en el suelo, extienda lentamente los brazos para levantar la mitad superior del cuerpo y Optometristelevar los hombros. No use los msculos de la espalda para elevar la parte superior del torso. Puede cambiar las manos de  lugar para estar ms cmodo. Mantenga esta posicin durante 5 segundos mientras mantiene la espalda relajada. Lentamente vuelva a la posicin horizontal.  Puentes Repita estos pasos 10 veces: Acustese boca arriba sobre una cama firme o sobre el suelo. Flexione las rodillas de modo que apunten al techo y los pies queden apoyados en el suelo. Los brazos deben estar paralelos a los costados del cuerpo, junto al cuerpo. Contraiga los glteos y despegue las nalgas del suelo hasta que la cintura est casi a la misma altura que las rodillas. Debe sentir el trabajo muscular en las nalgas y la parte de atrs de los muslos. Si no siente el esfuerzo de BorgWarnerestos msculos, aleje los pies 1 a 2 pulgadas (2.5 a 5 cm) de las nalgas. Mantenga esta posicin durante 3 a 5 segundos. Baje lentamente las caderas a la posicin inicial y relaje las nalgas por completo. Si este ejercicio le resulta muy fcil, intente realizarlo con los brazos cruzados [place]bre el pecho. Abdominales Repita estos pasos 5 a 10 veces: Acustese boca arriba sobre una cama dura o sobre el suelo con las piernas extendidas. Flexione las rodillas de modo que apunten al techo y los pies queden apoyados en el suelo. Cruce los World Fuel Services Corporationbrazos sobre el pecho. Baje levemente el mentn en direccin al pecho sin doblar el cuello. Contraiga los msculos abdominales y con lentitud eleve el torso lo suficiente como para Artistdespegar levemente los omplatos del suelo. No eleve el torso ms que eso, porque esto puede sobreexigir a la zona lumbar y no ayuda a Physiological scientistfortalecer los msculos abdominales. Regrese lentamente a la posicin inicial.  Elevaciones de espalda Repita estos pasos 5 a 10 veces: Acustese sobre el abdomen (boca abajo) con los brazos a los costados del cuerpo y apoye la frente en el suelo. Contraiga los msculos de las piernas y las nalgas. Lentamente despegue el pecho del suelo mientras mantiene las caderas bien apoyadas en el suelo. Mantenga la nuca  alineada con la curvatura de la espalda. Los ojos deben mirar al suelo. Mantenga esta posicin durante 3 a 5 segundos. Regrese lentamente a la posicin inicial.  Comunquese con un mdico si: El dolor o las molestias en la espalda se vuelven mucho ms intensos cuando hace un ejercicio. El dolor o las molestias en la espalda que [place]oran, no se Copyalivian en el trmino de las 2 horas posteriores a Copyhacer los ejercicios. Si tiene alguno de Limited Brandsestos problemas, deje de ARAMARK Corporationhacer los ejercicios de inmediato. No vuelva a hacer los ejercicios a menos que el mdico lo autorice. Solicite ayuda de inmediato si: Siente un dolor sbito e intenso en la espalda. Si esto ocurre, deje de ARAMARK Corporationhacer los ejercicios de inmediato. No vuelva a hacer los ejercicios a menos que el mdico lo autorice. Esta informacin no tiene Theme park managercomo fin reemplazar el consejo del mdico. Asegrese de hacerle al mdico cualquier pregunta que tenga. Document Revised: 01/11/2021 Document Reviewed: 01/11/2021 Elsevier Patient Education  2023 Elsevier Inc.  Ejercicios para las rodillas Knee Exercises Pregunte al mdico qu ejercicios son seguros para usted. Maricela CuretHaga  los ejercicios exactamente como se lo haya indicado el mdico y gradelos como se lo hayan indicado. Es normal sentir un leve estiramiento, tironeo, opresin o Dentist al [employer] ejercicios. Detngase de inmediato si siente un dolor repentino o Community education officer. No comience a hacer estos ejercicios hasta que se lo indique el mdico. Ejercicios de elongacin y amplitud de movimiento Estos ejercicios calientan los msculos y las articulaciones, y mejoran la movilidad y la flexibilidad de la rodilla. Adems, ayudan a Engineer, materials y la hinchazn. Extensin de la rodilla, en decbito prono  Recustese boca abajo (posicin prona) sobre una cama. Coloque la rodilla izquierda/derecha fuera de dicha superficie para no apoyarla. Puede colocar una toalla debajo del muslo izquierdo/derecho, justo  sobre la [place], para mayor comodidad. Relaje los msculos de la pierna, dejando que la ley de gravedad extienda la rodilla (extensin). Debe sentir un estiramiento detrs de la rodilla izquierda/derecha. Mantenga esta posicin durante __________ segundos. Muvase un poco hacia adelante para apoyar la rodilla entre repeticiones. Repita __________ veces. Realice este ejercicio __________ veces al da. Flexin de la rodilla, activo  Acustese boca arriba con las piernas extendidas. Si esto le ocasiona molestias en la espalda, flexione la rodilla izquierda/derecha y coloque el pie sobre el suelo. Lentamente, acerque el taln izquierdo/derecho [employer]. Detngase cuando sienta un leve estiramiento en la parte de adelante de la rodilla o del muslo (flexin). Mantenga esta posicin durante __________ segundos. Deslice lentamente el taln izquierdo/derecho hasta la posicin inicial. Repita __________ veces. Realice este ejercicio __________ veces al da. Estiramiento de cudriceps, decbito prono  Recustese boca abajo en una superficie firme, como una cama o un suelo acolchonado. Flexione la rodilla izquierda/derecha y tmese del tobillo. Si no puede llegar al Laretta [place] o al pantaln, tese un cinturn alrededor del pie y agrrelo en Nature conservation officer. Acerque lentamente el taln a las nalgas. La rodilla no deber [employer]. Debe sentir un estiramiento en la parte delantera del muslo y la rodilla (cudriceps). Mantenga esta posicin durante __________ segundos. Repita __________ veces. Realice este ejercicio __________ veces al da. Isquiotibiales, en decbito supino  Acustese boca arriba (posicin supina). Ate un cinturn o una toalla en la regin metatarsiana del pie izquierdo/derecho. La regin metatarsiana del pie es la superficie sobre la que caminamos, justo debajo de los dedos. Estire la rodilla izquierda/derecha y, lentamente, tire del cinturn para elevar la pierna, hasta que  sienta un leve estiramiento detrs de la rodilla (isquiotibiales). No flexione la rodilla mientras realiza este ejercicio. Mantenga la otra pierna estirada contra el suelo. Mantenga esta posicin durante __________ segundos. Repita __________ veces. Realice este ejercicio __________ veces al da. Ejercicios de fortalecimiento Estos ejercicios fortalecen la rodilla y Horticulturist, commercial resistencia. La resistencia es la capacidad de usar los msculos durante un tiempo prolongado, incluso despus de que se cansen. Cudriceps, ejercicio isomtrico En este ejercicio, se estiran los msculos de la parte delantera del muslo (cudriceps) sin mover la articulacin de la rodilla (isomtrico). Recustese boca arriba con la pierna izquierda/derecha extendida y la rodilla opuesta flexionada. Coloque una toalla enrollada o un almohadn pequeo debajo de la rodilla, segn las indicaciones del mdico. Tensione lentamente los msculos de la parte de adelante del muslo izquierdo/derecho. Deber ver la rtula que se desliza para arriba hacia la cadera o que se profundizan los hoyuelos justo por encima de la rodilla. Este [employer] la parte posterior de rodilla hacia el suelo. Mantenga el msculo tan apretado como pueda sin aumentar  el dolor durante __________ segundos. Relaje los msculos lentamente y por completo. Repita __________ veces. Realice este ejercicio __________ veces al da. Elevaciones con la pierna extendida Este ejercicio fortalece los msculos de la parte de adelante del muslo (cudriceps) y los msculos que permiten mover las caderas (flexores de cadera). Recustese boca arriba con la pierna izquierda/derecha extendida y la rodilla opuesta flexionada. Tensione los msculos de la parte de adelante del muslo izquierdo/derecho. Deber ver la rtula de la rodilla que se desliza hacia arriba o que se profundizan los hoyuelos justo por encima de la rodilla. El muslo podr sacudirse un poco. Mantenga  estos msculos contrados mientras levanta la pierna a una altura de 4 a 6 pulgadas (10 a 15 cm) del suelo. No deje que la rodilla se flexione. Mantenga esta posicin durante __________ segundos. Mantenga estos msculos contrados mientras baja la pierna. Deje que sus msculos se relajen completamente despus de cada repeticin. Repita __________ veces. Realice este ejercicio __________ veces al da. Isquiotibiales, isomtrico  Acustese boca arriba sobre una superficie firme. Flexione la rodilla izquierda/derecha aproximadamente __________ Danelle Berry. Clave el taln izquierdo/derecho en la superficie, como si estuviera tratando de tirar de l [employer]. Tense los msculos de la parte posterior de los muslos (isquiotibiales) para presionar tanto como pueda, sin Tax inspector. Mantenga esta posicin durante __________ segundos. Libere la tensin poco a poco y deje que sus msculos se relajen completamente durante __________ [employer] cada repeticin. Repita __________ veces. Realice este ejercicio __________ veces al da. Isquiotibiales con flexin Realice este ejercicio con pesas en los tobillos si se lo ha indicado el mdico. Comience con pesas de __________ libras/kilogramos. Luego aumente el peso en incrementos de 1 libra (0.5 kg). No use pesas en los tobillos que pesen ms de __________ libras/kilogramos. Recustese boca abajo con las piernas extendidas. Flexione la rodilla izquierda/derecha lo ms que pueda sin Financial risk analyst. Mantenga la cadera apoyada contra el suelo. Mantenga esta posicin durante __________ segundos. Baje lentamente la pierna hasta la posicin inicial. Repita __________ veces. Realice este ejercicio __________ veces al da. Cuclillas Este ejercicio fortalece los msculos de la parte de adelante del muslo y la rodilla (cudriceps). Prese enfrente de [employer], con los pies y las rodillas apuntando hacia adelante. Puede colocar las [employer] la mesa para  mantener el equilibrio, pero no para apoyarse. Flexione lentamente las rodillas y baje la cadera, como si fuera a sentarse en una silla. Mantenga el [employer] talones, no [employer] dedos. Mantenga la parte inferior de las piernas rectas, de modo que queden paralelas a las patas de la mesa. No deje que la cadera quede ms abajo que las rodillas. No flexione las rodillas ms de lo indicado por el mdico. Si el dolor en las rodillas [place], no las flexione Scissors. Mantenga la posicin de cuclillas durante __________ segundos. Haga fuerza con las piernas para pararse de [place]. No use las manos para pararse. Repita __________ veces. Realice este ejercicio __________ veces al da. Deslizamientos sobre la pared [employer] ejercicio fortalece los msculos de la parte de adelante del muslo y la rodilla (cudriceps). Apoye la espalda contra una pared o una puerta, pero mantenga los pies a una distancia de entre 18 y 24 pulgadas (46 y 61 cm) de la pared o puerta. Separe los pies al ancho de caderas. Deslcese lentamente hacia abajo, contra la pared o la puerta, de modo que sus rodillas queden flexionadas a __________ Danelle Berry. Mantenga las [employer], no  sobre la punta de los pies. Mantenga las rodillas alineadas con las caderas. Mantenga esta posicin durante __________ segundos. Repita __________ veces. Realice este ejercicio __________ veces al da. Elevaciones con pierna extendida, recostado de lado Este ejercicio fortalece los msculos que rotan la pierna a la altura de la cadera y la alejan del cuerpo (abductores de la cadera). Recustese de costado con la pierna izquierda/derecha arriba. Recustese de [employer]	[place] cabeza, los hombros, la rodilla y la cadera estn alineados. Puede flexionar la rodilla de abajo para mantener el equilibrio. Mueva ligeramente las caderas [address], de modo que queden alineadas y la rodilla izquierda/derecha quede hacia adelante. Con el taln como gua,  levante la pierna de 4 a 6 pulgadas (10 a 15 cm). Debe sentir que se elevan los msculos de la parte externa de la cadera. No lleve el pie hacia adelante. No gire la rodilla hacia el techo. Mantenga esta posicin durante __________ segundos. Regrese lentamente la pierna a la posicin inicial. Deje que los msculos se relajen completamente despus de cada repeticin. Repita __________ veces. Realice este ejercicio __________ veces al da. Elevaciones con la pierna extendida, en decbito prono Este ejercicio [employer] que extienden las caderas desde la parte anterior de la pelvis (extensores de cadera). Acustese boca abajo sobre una superficie firme. Puede colocar un almohadn debajo de la cadera si le resulta ms cmodo. Tense los msculos de las nalgas para levantar la pierna izquierda/derecha unas 4 a 6 pulgadas (10 a 15 cm). Mantenga la rodilla estirada mientras eleva la pierna. Mantenga esta posicin durante __________ segundos. Baje lentamente la pierna hasta la posicin inicial. Deje que la pierna se relaje completamente despus de cada repeticin. Repita __________ veces. Realice este ejercicio __________ veces al da. Esta informacin no tiene Theme park manager el consejo del mdico. Asegrese de hacerle al mdico cualquier pregunta que tenga. Document Revised: 03/21/2021 Document Reviewed: 03/21/2021 Elsevier Patient Education  2023 [employer].

## 2022-01-22 LAB — CBC WITH DIFFERENTIAL/PLATELET
Absolute Monocytes: 521 cells/uL (ref 200–950)
Basophils Absolute: 40 cells/uL (ref 0–200)
Basophils Relative: 1.3 %
Eosinophils Absolute: 59 cells/uL (ref 15–500)
Eosinophils Relative: 1.9 %
HCT: 37.5 % (ref 35.0–45.0)
Hemoglobin: 12.8 g/dL (ref 11.7–15.5)
Lymphs Abs: 1234 cells/uL (ref 850–3900)
MCH: 32.7 pg (ref 27.0–33.0)
MCHC: 34.1 g/dL (ref 32.0–36.0)
MCV: 95.7 fL (ref 80.0–100.0)
MPV: 9.4 fL (ref 7.5–12.5)
Monocytes Relative: 16.8 %
Neutro Abs: 1246 cells/uL — ABNORMAL LOW (ref 1500–7800)
Neutrophils Relative %: 40.2 %
Platelets: 282 10*3/uL (ref 140–400)
RBC: 3.92 10*6/uL (ref 3.80–5.10)
RDW: 12.3 % (ref 11.0–15.0)
Total Lymphocyte: 39.8 %
WBC: 3.1 10*3/uL — ABNORMAL LOW (ref 3.8–10.8)

## 2022-01-22 LAB — COMPLETE METABOLIC PANEL WITH GFR
AG Ratio: 1.7 (calc) (ref 1.0–2.5)
ALT: 17 U/L (ref 6–29)
AST: 22 U/L (ref 10–30)
Albumin: 4.5 g/dL (ref 3.6–5.1)
Alkaline phosphatase (APISO): 56 U/L (ref 31–125)
BUN: 13 mg/dL (ref 7–25)
CO2: 26 mmol/L (ref 20–32)
Calcium: 9.5 mg/dL (ref 8.6–10.2)
Chloride: 104 mmol/L (ref 98–110)
Creat: 0.79 mg/dL (ref 0.50–0.99)
Globulin: 2.7 g/dL (calc) (ref 1.9–3.7)
Glucose, Bld: 93 mg/dL (ref 65–99)
Potassium: 4.1 mmol/L (ref 3.5–5.3)
Sodium: 138 mmol/L (ref 135–146)
Total Bilirubin: 0.6 mg/dL (ref 0.2–1.2)
Total Protein: 7.2 g/dL (ref 6.1–8.1)
eGFR: 95 mL/min/{1.73_m2} (ref >=60)

## 2022-01-22 LAB — URINALYSIS, ROUTINE W REFLEX MICROSCOPIC
Bilirubin Urine: NEGATIVE
Glucose, UA: NEGATIVE
Hgb urine dipstick: NEGATIVE
Ketones, ur: NEGATIVE
Leukocytes,Ua: NEGATIVE
Nitrite: NEGATIVE
Protein, ur: NEGATIVE
Specific Gravity, Urine: 1.008 (ref 1.001–1.035)
pH: 5.5 (ref 5.0–8.0)

## 2022-01-22 NOTE — Progress Notes (Signed)
White cell count is low and stable.  CMP is normal and UA is negative.

## 2022-03-31 ENCOUNTER — Encounter: Payer: Self-pay | Admitting: *Deleted

## 2022-06-11 ENCOUNTER — Other Ambulatory Visit: Payer: Self-pay | Admitting: Family

## 2022-06-11 DIAGNOSIS — N951 Menopausal and female climacteric states: Secondary | ICD-10-CM

## 2022-06-12 NOTE — Progress Notes (Signed)
Office Visit Note  Patient: [name]             Date of Birth: 06-04-1976           MRN: [id number]             PCP: Dulce Sellar, NP Referring: Dulce Sellar, NP Visit Date: 06/24/2022 Occupation: @GUAROCC @  Interpreter: Bobbe Medico Subjective:  Sicca symptoms   History of Present Illness: [name] is a 46 y.o. female with history of sjogren's syndrome and osteoarthritis.  She remains on plaquenil 200 mg 1 tablet by mouth twice daily Monday through Friday. She is tolerating plaquenil without any side effects.  She has not missed any doses of Plaquenil recently.  She denies any new or worsening symptoms since her last office visit.  She has an appointment tomorrow with Dr. Leonor Liv for evaluation of her ongoing neck and left arm pain.  She denies any other joint pain or joint swelling at this time.  She restarted taking meloxicam 7.5 mg 1 tablet daily for pain relief which has provided minimal relief. She has ongoing sicca symptoms.  She has been using eye drops and mouth rinse for symptomatic relief.  She continues to see her ophthalmologist every year and her dentist every 6 months.  She has occasional sores in her mouth but denies any nasal ulcers.  She has intermittent symptoms of Raynaud's but denies any digital ulcerations.  She has not had any shortness of breath or pleuritic chest pain.  She has not had any recent rashes.  Activities of Daily Living:  Patient reports morning stiffness for a few minutes.   Patient Reports nocturnal pain.  Difficulty dressing/grooming: Reports Difficulty climbing stairs: Reports Difficulty getting out of chair: Reports Difficulty using hands for taps, buttons, cutlery, and/or writing: Denies  Review of Systems  Constitutional:  Positive for fatigue.  HENT:  Positive for mouth dryness. Negative for mouth sores.   Eyes:  Positive for dryness.  Respiratory:  Negative for shortness of breath.    Cardiovascular:  Negative for chest pain and palpitations.  Gastrointestinal:  Positive for constipation. Negative for blood in stool and diarrhea.  Endocrine: Negative for increased urination.  Genitourinary:  Negative for involuntary urination.  Musculoskeletal:  Positive for myalgias, morning stiffness, muscle tenderness and myalgias. Negative for joint pain, gait problem, joint pain, joint swelling and muscle weakness.  Skin:  Positive for color change, hair loss and sensitivity to sunlight. Negative for rash.  Allergic/Immunologic: Negative for susceptible to infections.  Neurological:  Positive for numbness. Negative for dizziness.  Hematological:  Negative for swollen glands.  Psychiatric/Behavioral:  Positive for sleep disturbance. Negative for depressed mood. The patient is not nervous/anxious.     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    Past Medical History:  Diagnosis Date   Abnormal uterine bleeding    History of chicken pox    Sjogren's disease (HCC)    Urinary tract infection 12/2014   Vaginal Pap smear, abnormal     Family History  Problem Relation Age of Onset   Diabetes Mother    Hypertension Mother    High Cholesterol  Mother    Healthy Sister    Healthy Brother    Healthy Brother    Breast cancer Paternal Grandmother    Healthy Son    Healthy Son    Healthy Son    Past Surgical History:  Procedure Laterality Date   CESAREAN SECTION     LEEP     Social History   Social History Narrative   Not on file   Immunization History  Administered Date(s) Administered    Influenza,inj,Quad PF,6+ Mos 06/24/2018, 04/08/2019, 07/25/2020, 05/17/2021, 06/13/2022   PFIZER(Purple Top)SARS-COV-2 Vaccination 07/31/2020, 08/21/2020   Pneumococcal Polysaccharide-23 03/25/2018   Tdap 11/23/2014     Objective: Vital Signs: BP 130/88 (BP Location: Right Arm, Patient Position: Sitting, Cuff Size: Normal)   Pulse 76   Resp 13   Ht 5\' 1"  (1.549 m)   Wt 162 lb (73.5 kg)   LMP 05/21/2022 (Approximate)   BMI 30.61 kg/m    Physical Exam Vitals and nursing note reviewed.  Constitutional:      Appearance: She is well-developed.  HENT:     Head: Normocephalic and atraumatic.  Eyes:     Conjunctiva/sclera: Conjunctivae normal.  Cardiovascular:     Rate and Rhythm: Normal rate and regular rhythm.     Heart sounds: Normal heart sounds.  Pulmonary:     Effort: Pulmonary effort is normal.     Breath sounds: Normal breath sounds.  Abdominal:     General: Bowel sounds are normal.     Palpations: Abdomen is soft.  Musculoskeletal:     Cervical back: Normal range of motion.  Lymphadenopathy:     Cervical: No cervical adenopathy.  Skin:    General: Skin is warm and dry.     Capillary Refill: Capillary refill takes less than 2 seconds.  Neurological:     Mental Status: She is alert and oriented to person, place, and time.  Psychiatric:        Behavior: Behavior normal.      Musculoskeletal Exam: C-spine has limited ROM with lateral rotation with discomfort especially to the left.   No midline spinal tenderness.  Painful range of motion of the left shoulder joint.  Right shoulder has good range of motion with no discomfort.  Elbow joints, wrist joints, MCPs, PIPs, DIPs have good range of motion with no synovitis.  Complete fist formation bilaterally.  Hip joints have good range of motion with no groin pain.  No tenderness over trochanteric bursa bilaterally.  Knee joints have good range of motion with no warmth or effusion.  Ankle joints have good range of motion with no  tenderness or joint swelling. CDAI Exam: CDAI Score: -- Patient Global: --; Provider Global: -- Swollen: --; Tender: -- Joint Exam 06/24/2022   No joint exam has been documented for this visit   There is currently no information documented on the homunculus. Go to the Rheumatology activity and complete the homunculus joint exam.  Investigation: No additional findings.  Imaging: No results found.  Recent Labs: Lab Results  Component Value Date   WBC 3.1 (L) 01/21/2022   HGB 12.8 01/21/2022   PLT 282 01/21/2022   NA 138 01/21/2022   K 4.1 01/21/2022   CL 104 01/21/2022   CO2 26 01/21/2022   GLUCOSE 93 01/21/2022   BUN 13 01/21/2022   CREATININE 0.79 01/21/2022   BILITOT 0.6 01/21/2022   ALKPHOS 62 08/09/2020   AST 22 01/21/2022   ALT 17 01/21/2022   PROT 7.2 01/21/2022  ALBUMIN 4.4 08/09/2020   CALCIUM 9.5 01/21/2022   GFRAA 125 07/11/2020    Speciality Comments: PLQ Eye Exam:08/22/2021 WNL @ [facility] Follow up in 1 year  Procedures:  No procedures performed Allergies: Patient has no known allergies.   Assessment / Plan:     Visit Diagnoses: Sjogren's syndrome with other organ involvement (HCC) - ANA 1:1280, Ro>8, Sicca sx, arthralgias, neutropenia, hypocomplementemia: Patient continues to have ongoing sicca symptoms.  Overall her symptoms have been tolerable with the use of eyedrops and oral rinse for symptomatic relief.  Discussed the importance of seeing her ophthalmologist at least on a yearly basis and her dentist every 6 months.  She remains on Plaquenil 200 mg 1 tablet by mouth twice daily Monday through Friday.  She is tolerating Plaquenil without any side effects and has not missed any doses recently.  She has not had any recent rashes.  She has no signs of inflammatory arthritis at this time.  She is not experiencing any shortness of breath or pleuritic chest pain.   Lab work from 08/20/2021 and 01/21/2022 were reviewed today in the office.  The  following lab work will be obtained today for further evaluation.  She will remain on Plaquenil as prescribed.  She was advised to notify us if she develops any new or worsening symptoms.  She will follow-up in the office in 3 months or sooner if needed. - Plan: CBC with Differential/Platelet, COMPLETE METABOLIC PANEL WITH GFR, ANA, Urinalysis, Routine w reflex microscopic, Sedimentation rate, C3 and C4, Rheumatoid factor, Sjogrens syndrome-B extractable nuclear antibody, Sjogrens syndrome-A extractable nuclear antibody, Serum protein electrophoresis with reflex  High risk medication use - Plaquenil 200 mg 1 tablet by mouth twice daily Monday through Friday only.  PLQ Eye Exam:08/22/2021.  CBC and CMP were drawn on 01/21/2022.  Orders for CBC and CMP were released today. - Plan: CBC with Differential/Platelet, COMPLETE METABOLIC PANEL WITH GFR  History of neutropenia: White blood cell count was 3.1, absolute neutrophils were 1246 on 01/21/2022.  CBC with differential updated today.  Primary osteoarthritis of both knees: She has good range of motion of both knee joints on examination today.  No warmth or effusion noted.  Primary osteoarthritis of both feet: Ankle joints have good range of motion with no tenderness or synovitis.  Chronic SI joint pain: She experiences intermittent discomfort in her SI joints.  Chronic left-sided low back pain with left-sided sciatica: No symptoms of sciatica at this time.  Cervicalgia: Patient continues to have ongoing neck pain and stiffness.  On examination she has slightly limited range of motion with discomfort with lateral rotation to the left.  She is also been having radiating pain down her left arm intermittently.  She has an upcoming appointment scheduled tomorrow with Dr. Leonor Liv for further evaluation.   Other fatigue: Stable.   Language barrier: Interpreter present throughout the entirety of the visit.  All questions were addressed.  Orders: Orders  Placed This Encounter  Procedures   CBC with Differential/Platelet   COMPLETE METABOLIC PANEL WITH GFR   ANA   Urinalysis, Routine w reflex microscopic   Sedimentation rate   C3 and C4   Rheumatoid factor   Sjogrens syndrome-B extractable nuclear antibody   Sjogrens syndrome-A extractable nuclear antibody   Serum protein electrophoresis with reflex   Meds ordered this encounter  Medications   hydroxychloroquine (PLAQUENIL) 200 MG tablet    Sig: TAKE 1 TABLET BY MOUTH TWICE DAILY MONDAY THROUGH FRIDAY ONLY  Dispense:  120 tablet    Refill:  0    Please fill prescription as written. Thanks!     Follow-Up Instructions: Return in about 5 months (around 11/23/2022) for Sjogren's syndrome, Osteoarthritis.   Gearldine Bienenstock, PA-C  Note - This record has been created using Dragon software.  Chart creation errors have been sought, but may not always  have been located. Such creation errors do not reflect on  the standard of medical care.

## 2022-06-13 ENCOUNTER — Encounter: Payer: Self-pay | Admitting: Family

## 2022-06-13 ENCOUNTER — Ambulatory Visit (INDEPENDENT_AMBULATORY_CARE_PROVIDER_SITE_OTHER): Payer: Commercial Managed Care - PPO | Admitting: Family

## 2022-06-13 VITALS — BP 136/85 | HR 87 | Temp 97.6°F | Ht 61.0 in | Wt 160.2 lb

## 2022-06-13 DIAGNOSIS — Z23 Encounter for immunization: Secondary | ICD-10-CM | POA: Diagnosis not present

## 2022-06-13 DIAGNOSIS — M542 Cervicalgia: Secondary | ICD-10-CM

## 2022-06-13 DIAGNOSIS — T783XXA Angioneurotic edema, initial encounter: Secondary | ICD-10-CM

## 2022-06-13 MED ORDER — MELOXICAM 7.5 MG PO TABS
7.5000 mg | ORAL_TABLET | Freq: Every day | ORAL | 2 refills | Status: DC
Start: 2022-06-13 — End: 2022-09-08

## 2022-06-13 NOTE — Progress Notes (Unsigned)
Patient ID: [name], female    DOB: 02/01/1976, 46 y.o.   MRN: [id number]  Chief Complaint  Patient presents with   Headache    Pt c/o headache on left side of head, left shoulder and arm for about 3 weeks. Pain is stabbing and sharp pains. Has tylenol and motrin which does not help. Pt c/o Left sided lip swelling and drooping for about 2 months off and on. Pain is worse at night.    Oral Swelling    Lower lip   HPI: Headache: Patient complains of headache. Started having HAs 2 weeks ago. Worst time of day: hurts all day. 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  Swollen lip:  angiodema of lower lip, with tingling and numbness, comes on and lasts for 30 minutes and then goes away, last time happened about 2 weeks ago and initially started around 3 mos ago. Denies eating any mango recently or other new foods, no new meds. Denies any bump or sore, just swelling.  Assessment & Plan:   Problem List Items Addressed This Visit   None Visit Diagnoses     Cervicalgia    -  Primary involving trapezius, neck and head pain, burning, refilling Mobic to try, sending to sports med for further workup, may need new MRI, last one 2 yr ago.    Relevant Orders   Ambulatory referral to Sports Medicine   Need for immunization against influenza       Relevant Orders   Flu Vaccine QUAD 6+ mos PF IM (Fluarix Quad PF) (Completed)   Angioedema, initial encounter           Subjective:    Outpatient Medications Prior to Visit  Medication Sig Dispense Refill   aspirin-acetaminophen-caffeine (EXCEDRIN MIGRAINE) 250-250-65 MG tablet Take 2 tablets by mouth every 6 (six) hours as needed for headache.     hydroxychloroquine (PLAQUENIL) 200 MG  tablet TAKE 1 TABLET BY MOUTH TWICE DAILY MONDAY THROUGH FRIDAY ONLY 120 tablet 0   meclizine (ANTIVERT) 12.5 MG tablet Take 1 tablet (12.5 mg total) by mouth 3 (three) times daily as needed for dizziness. 30 tablet 0   Multiple Vitamin (MULTIVITAMIN) tablet Take 1 tablet by mouth daily.     norethindrone-ethinyl estradiol (LOESTRIN) 1-20 MG-MCG tablet TAKE 1 TABLET BY MOUTH DAILY 21 tablet 0   vitamin C (ASCORBIC ACID) 500 MG tablet Take 500 mg by mouth daily.     DULoxetine (CYMBALTA) 60 MG capsule TAKE ONE CAPSULE BY MOUTH EVERY DAY 90 capsule 1   sucralfate (CARAFATE) 1 g tablet      promethazine-dextromethorphan (PROMETHAZINE-DM) 6.25-15 MG/5ML syrup Take 5 mLs by mouth 3 (three) times daily as needed for cough. (Patient not taking: Reported on 06/13/2022) 118 mL 0   No facility-administered medications prior to visit.   Past Medical History:  Diagnosis Date   Abnormal uterine bleeding    History of chicken pox    Sjogren's disease (HCC)    Urinary tract infection 12/2014   Vaginal Pap smear, abnormal    Past Surgical History:  Procedure Laterality Date   CESAREAN SECTION     LEEP     No Known Allergies    Objective:  Physical Exam BP 136/85 (BP Location: Left Arm, Patient Position: Sitting, Cuff Size: Large)   Pulse 87   Temp 97.6 F (36.4 C) (Temporal)   Ht 5\' 1"  (1.549 m)   Wt 160 lb 4 oz (72.7 kg)   LMP 05/21/2022 (Approximate)   SpO2 100%   BMI 30.28 kg/m  Wt Readings from Last 3 Encounters:  06/13/22 160 lb 4 oz (72.7 kg)  01/21/22 161 lb 12.8 oz (73.4 kg)  10/18/21 158 lb 8 oz (71.9 kg)       Dulce Sellar, NP

## 2022-06-24 ENCOUNTER — Encounter: Payer: Self-pay | Admitting: Physician Assistant

## 2022-06-24 ENCOUNTER — Ambulatory Visit: Payer: Commercial Managed Care - PPO | Attending: Physician Assistant | Admitting: Physician Assistant

## 2022-06-24 VITALS — BP 130/88 | HR 76 | Resp 13 | Ht 61.0 in | Wt 162.0 lb

## 2022-06-24 DIAGNOSIS — M542 Cervicalgia: Secondary | ICD-10-CM

## 2022-06-24 DIAGNOSIS — Z79899 Other long term (current) drug therapy: Secondary | ICD-10-CM

## 2022-06-24 DIAGNOSIS — Z862 Personal history of diseases of the blood and blood-forming organs and certain disorders involving the immune mechanism: Secondary | ICD-10-CM | POA: Diagnosis not present

## 2022-06-24 DIAGNOSIS — M19072 Primary osteoarthritis, left ankle and foot: Secondary | ICD-10-CM

## 2022-06-24 DIAGNOSIS — R5383 Other fatigue: Secondary | ICD-10-CM

## 2022-06-24 DIAGNOSIS — M5442 Lumbago with sciatica, left side: Secondary | ICD-10-CM

## 2022-06-24 DIAGNOSIS — M19071 Primary osteoarthritis, right ankle and foot: Secondary | ICD-10-CM

## 2022-06-24 DIAGNOSIS — M170 Bilateral primary osteoarthritis of knee: Secondary | ICD-10-CM | POA: Diagnosis not present

## 2022-06-24 DIAGNOSIS — M3509 Sicca syndrome with other organ involvement: Secondary | ICD-10-CM | POA: Diagnosis not present

## 2022-06-24 DIAGNOSIS — G8929 Other chronic pain: Secondary | ICD-10-CM

## 2022-06-24 DIAGNOSIS — M533 Sacrococcygeal disorders, not elsewhere classified: Secondary | ICD-10-CM

## 2022-06-24 DIAGNOSIS — Z789 Other specified health status: Secondary | ICD-10-CM

## 2022-06-24 MED ORDER — HYDROXYCHLOROQUINE SULFATE 200 MG PO TABS: ORAL_TABLET | ORAL | 0 refills | Status: DC

## 2022-06-25 ENCOUNTER — Ambulatory Visit (INDEPENDENT_AMBULATORY_CARE_PROVIDER_SITE_OTHER): Payer: Commercial Managed Care - PPO | Admitting: Sports Medicine

## 2022-06-25 ENCOUNTER — Ambulatory Visit: Payer: Self-pay

## 2022-06-25 ENCOUNTER — Telehealth: Payer: Self-pay | Admitting: *Deleted

## 2022-06-25 VITALS — BP 128/80 | Ht 61.0 in | Wt 162.0 lb

## 2022-06-25 DIAGNOSIS — M25512 Pain in left shoulder: Secondary | ICD-10-CM

## 2022-06-25 DIAGNOSIS — Z79899 Other long term (current) drug therapy: Secondary | ICD-10-CM

## 2022-06-25 HISTORY — DX: Pain in left shoulder: M25.512

## 2022-06-25 NOTE — Progress Notes (Signed)
WBC count is low-2.7-trending down. absolute neutrophils are low-improved.  Rest of CBC WNL. Recheck CBC with diff in 1 month.  CMP WNL.  ESR and complements WNL.  RF negative.   UA revealed trace hemoglobin-please clarify if she was on her menstrual cycle?

## 2022-06-25 NOTE — Assessment & Plan Note (Signed)
She has some signs of rotator cuff tendinopathy especially pain with empty can testing.  There is somewhat of a mixed picture as she has some neck pain and tenderness with palpation to her rhomboids.  The plan today is to start her in some physical therapy for her shoulder and neck.  I have also given her some home exercises if she is unable to get to physical therapy.  Will follow up with her in 4-6 weeks.  If she has no improvement we can consider steroid injection the future MRI.  She may continue with the meloxicam she is previously prescribed.

## 2022-06-25 NOTE — Telephone Encounter (Signed)
-----  Message from Ofilia Neas, PA-C sent at 06/25/2022  8:03 AM EST ----- WBC count is low-2.7-trending down. absolute neutrophils are low-improved.  Rest of CBC WNL. Recheck CBC with diff in 1 month.  CMP WNL.  ESR and complements WNL.  RF negative.   UA revealed trace hemoglobin-please clarify if she was on her menstrual cycle?

## 2022-06-25 NOTE — Progress Notes (Unsigned)
Established Patient Office Visit  Subjective   Patient ID: [name], female    DOB: 04/26/1976  Age: 46 y.o. MRN: [id number]  Neck pain.  Ms. [name] is here today with a chief complaint of neck pain and left arm pain.  Translator was present for the duration of the visit as patient's preferred language is [place].she reports she has had this pain constantly for the past 5 months.  She describes it as a sharp burning pain in her upper back and shoulder that radiates down to a pulling sensation in her arm. She feels like she has some weakness when trying to lift things even 5 pounds at the gym.  Her primary care provider prescribed her meloxicam for 2 weeks that has provided her some relief. She denies any numbness or tingling. Of note she is right-hand dominant   ROS as listed above in HPI    Objective:     BP 128/80   Ht 5\' 1"  (1.549 m)   Wt 162 lb (73.5 kg)   LMP 05/21/2022 (Approximate)   BMI 30.61 kg/m   Physical Exam Vitals reviewed.  Constitutional:      General: She is not in acute distress.    Appearance: Normal appearance. She is obese. She is not ill-appearing, toxic-appearing or diaphoretic.  Pulmonary:     Effort: Pulmonary effort is normal.  Neurological:     Mental Status: She is alert.   Left shoulder: No obvious deformity or asymmetry.  No ecchymosis or edema.  She has full range of motion with forward flexion, abduction internal and external rotation.  She does have have a painful arc of motion.  Tenderness to palpation over her lateral shoulder.  Pain with empty can testing and resisted external rotation. Neck: No obvious deformity or asymmetry.  She has good range of motion with flexion, extension and rotation and side bending.  She does have some tenderness to palpation of her rhomboid muscles  MSK limited US of Shoulder, left  Patient was seated on exam table and shoulder US examination was performed using high frequency linear probe.    Biceps tendon- visualized within the bicipital groove in both longitudinal and transverse axis with no signs of fluid or hypoechoic changes. Tendon fibers intact without signs of irregularity.  Subscapularis tendon- visualized in both longitudinal and transverse axis with intact tendon inserting at the inferior lesser tubercle of the humerus. No signs of tear, no hypoechoic changes or tissue irregularity were seen.  Supraspinatus tendon- visualized in longitudinal, transverse, and dynamic views. No signs of tear with the tendon inserting at the superior facet of the greater tubercle of the humerus, no hypoechoic changes or tissue irregularity seen.  Infraspinatus and teres minor tendons- visualized in both longitudinal and transverse axis with tendon insertion at the middle facet of the great tubercle of the humerus with no signs of tearing, hypoechoic changes or tissue irregularity seen.  Posterior glenohumeral joint- visualized with proper alignment.  AC joint- visualized without bursal distension or significant bony spurs/arthritic changes.   IMPRESSION: Overall, grossly normal complete U/S examination of the shoulder     Assessment & Plan:   Problem List Items Addressed This Visit       Other   Pain in joint of left shoulder - Primary    She has some signs of rotator cuff tendinopathy especially pain with empty can testing.  There is somewhat of a mixed picture as she has some neck pain and tenderness with palpation to her  rhomboids.  The plan today is to start her in some physical therapy for her shoulder and neck.  I have also given her some home exercises if she is unable to get to physical therapy.  Will follow up with her in 4-6 weeks.  If she has no improvement we can consider steroid injection the future MRI.  She may continue with the meloxicam she is previously prescribed.      Relevant Orders   [place] LIMITED JOINT SPACE STRUCTURES UP LEFT   Ambulatory referral to Physical  Therapy    Return in about 4 weeks (around 07/23/2022).    Claudie Leach, DO

## 2022-06-26 NOTE — Progress Notes (Signed)
Ro antibody positive.  La antibody negative.

## 2022-06-26 NOTE — Progress Notes (Signed)
ANA remains positive.

## 2022-06-27 LAB — URINALYSIS, ROUTINE W REFLEX MICROSCOPIC
Bacteria, UA: NONE SEEN /HPF
Bilirubin Urine: NEGATIVE
Glucose, UA: NEGATIVE
Hyaline Cast: NONE SEEN /LPF
Ketones, ur: NEGATIVE
Leukocytes,Ua: NEGATIVE
Nitrite: NEGATIVE
Protein, ur: NEGATIVE
RBC / HPF: NONE SEEN /HPF (ref 0–2)
Specific Gravity, Urine: 1.012 (ref 1.001–1.035)
Squamous Epithelial / HPF: NONE SEEN /HPF (ref <=5)
WBC, UA: NONE SEEN /HPF (ref 0–5)
pH: 5.5 (ref 5.0–8.0)

## 2022-06-27 LAB — MICROSCOPIC MESSAGE

## 2022-06-27 LAB — COMPLETE METABOLIC PANEL WITH GFR
AG Ratio: 1.5 (calc) (ref 1.0–2.5)
ALT: 18 U/L (ref 6–29)
AST: 27 U/L (ref 10–30)
Albumin: 4.7 g/dL (ref 3.6–5.1)
Alkaline phosphatase (APISO): 58 U/L (ref 31–125)
BUN: 14 mg/dL (ref 7–25)
CO2: 27 mmol/L (ref 20–32)
Calcium: 9.4 mg/dL (ref 8.6–10.2)
Chloride: 104 mmol/L (ref 98–110)
Creat: 0.71 mg/dL (ref 0.50–0.99)
Globulin: 3.2 g/dL (calc) (ref 1.9–3.7)
Glucose, Bld: 99 mg/dL (ref 65–99)
Potassium: 4.3 mmol/L (ref 3.5–5.3)
Sodium: 137 mmol/L (ref 135–146)
Total Bilirubin: 0.7 mg/dL (ref 0.2–1.2)
Total Protein: 7.9 g/dL (ref 6.1–8.1)
eGFR: 108 mL/min/{1.73_m2} (ref >=60)

## 2022-06-27 LAB — ANTI-NUCLEAR AB-TITER (ANA TITER)
ANA TITER: 1:1280 {titer} — ABNORMAL HIGH
ANA Titer 1: 1:320 {titer} — ABNORMAL HIGH

## 2022-06-27 LAB — CBC WITH DIFFERENTIAL/PLATELET
Absolute Monocytes: 348 cells/uL (ref 200–950)
Basophils Absolute: 49 cells/uL (ref 0–200)
Basophils Relative: 1.8 %
Eosinophils Absolute: 30 cells/uL (ref 15–500)
Eosinophils Relative: 1.1 %
HCT: 38.7 % (ref 35.0–45.0)
Hemoglobin: 13.2 g/dL (ref 11.7–15.5)
Lymphs Abs: 1002 cells/uL (ref 850–3900)
MCH: 32.4 pg (ref 27.0–33.0)
MCHC: 34.1 g/dL (ref 32.0–36.0)
MCV: 94.9 fL (ref 80.0–100.0)
MPV: 9.2 fL (ref 7.5–12.5)
Monocytes Relative: 12.9 %
Neutro Abs: 1272 cells/uL — ABNORMAL LOW (ref 1500–7800)
Neutrophils Relative %: 47.1 %
Platelets: 303 10*3/uL (ref 140–400)
RBC: 4.08 10*6/uL (ref 3.80–5.10)
RDW: 11.6 % (ref 11.0–15.0)
Total Lymphocyte: 37.1 %
WBC: 2.7 10*3/uL — ABNORMAL LOW (ref 3.8–10.8)

## 2022-06-27 LAB — SJOGRENS SYNDROME-A EXTRACTABLE NUCLEAR ANTIBODY: SSA (Ro) (ENA) Antibody, IgG: >8 AI — AB

## 2022-06-27 LAB — ANA: Anti Nuclear Antibody (ANA): POSITIVE — AB

## 2022-06-27 LAB — PROTEIN ELECTROPHORESIS, SERUM, WITH REFLEX
Albumin ELP: 4.5 g/dL (ref 3.8–4.8)
Alpha 1: 0.2 g/dL (ref 0.2–0.3)
Alpha 2: 0.6 g/dL (ref 0.5–0.9)
Beta 2: 0.5 g/dL (ref 0.2–0.5)
Beta Globulin: 0.5 g/dL (ref 0.4–0.6)
Gamma Globulin: 1.4 g/dL (ref 0.8–1.7)
Total Protein: 7.8 g/dL (ref 6.1–8.1)

## 2022-06-27 LAB — C3 AND C4
C3 Complement: 109 mg/dL (ref 83–193)
C4 Complement: 24 mg/dL (ref 15–57)

## 2022-06-27 LAB — RHEUMATOID FACTOR: Rheumatoid fact SerPl-aCnc: <14 IU/mL (ref <14)

## 2022-06-27 LAB — SJOGRENS SYNDROME-B EXTRACTABLE NUCLEAR ANTIBODY: SSB (La) (ENA) Antibody, IgG: <1 AI

## 2022-06-27 LAB — SEDIMENTATION RATE: Sed Rate: 11 mm/h (ref 0–20)

## 2022-06-27 NOTE — Progress Notes (Signed)
SPEP did not reveal any abnormal protein bands.

## 2022-08-11 ENCOUNTER — Other Ambulatory Visit: Payer: Self-pay | Admitting: *Deleted

## 2022-08-11 DIAGNOSIS — Z79899 Other long term (current) drug therapy: Secondary | ICD-10-CM

## 2022-08-11 LAB — CBC WITH DIFFERENTIAL/PLATELET
Absolute Monocytes: 336 cells/uL (ref 200–950)
Basophils Absolute: 50 cells/uL (ref 0–200)
Basophils Relative: 2.1 %
Eosinophils Absolute: 70 cells/uL (ref 15–500)
Eosinophils Relative: 2.9 %
HCT: 35.5 % (ref 35.0–45.0)
Hemoglobin: 12.1 g/dL (ref 11.7–15.5)
Lymphs Abs: 878 cells/uL (ref 850–3900)
MCH: 32 pg (ref 27.0–33.0)
MCHC: 34.1 g/dL (ref 32.0–36.0)
MCV: 93.9 fL (ref 80.0–100.0)
MPV: 9.2 fL (ref 7.5–12.5)
Monocytes Relative: 14 %
Neutro Abs: 1066 cells/uL — ABNORMAL LOW (ref 1500–7800)
Neutrophils Relative %: 44.4 %
Platelets: 308 10*3/uL (ref 140–400)
RBC: 3.78 10*6/uL — ABNORMAL LOW (ref 3.80–5.10)
RDW: 12.6 % (ref 11.0–15.0)
Total Lymphocyte: 36.6 %
WBC: 2.4 10*3/uL — ABNORMAL LOW (ref 3.8–10.8)

## 2022-08-12 NOTE — Progress Notes (Signed)
Please confirm if patient is taking hydroxychloroquine 1 tablet twice a day Monday to Friday if she is taking it on a regular basis that she should reduce Plaquenil to 1 tablet p.o. daily.  Recheck CBC in 1 month.

## 2022-08-13 ENCOUNTER — Other Ambulatory Visit: Payer: Self-pay

## 2022-08-13 DIAGNOSIS — Z79899 Other long term (current) drug therapy: Secondary | ICD-10-CM

## 2022-08-13 MED ORDER — HYDROXYCHLOROQUINE SULFATE 200 MG PO TABS: 200.0000 mg | ORAL_TABLET | Freq: Every day | ORAL | 0 refills | Status: DC

## 2022-08-13 NOTE — Progress Notes (Unsigned)
Lab note on 08/11/2022: she should reduce Plaquenil to 1 tablet p.o. daily.  Recheck CBC in 1 month.   Please review and sign pended no print prescription and future lab order. Thanks!

## 2022-08-19 ENCOUNTER — Other Ambulatory Visit: Payer: Self-pay | Admitting: Physician Assistant

## 2022-08-19 ENCOUNTER — Encounter: Payer: Self-pay | Admitting: *Deleted

## 2022-08-19 NOTE — Telephone Encounter (Signed)
Next Visit: 11/27/2022  Last Visit: 06/24/2022  Labs: 06/24/2022 WBC count is low-2.7-trending down. absolute neutrophils are low-improved.  Rest of CBC WNL. CMP WNL. 08/11/2022 WBC 2.4 RBC 3.78, Neutro Abs 1, 066  Eye exam: 08/22/2021 WNL    Current Dose per lab note 08/11/2022: reduce Plaquenil to 1 tablet p.o. daily   DX: Sjogren's syndrome with other organ involvement    Notified patient via my chart she is due to update   Okay to refill Plaquenil?

## 2022-09-06 ENCOUNTER — Other Ambulatory Visit: Payer: Self-pay | Admitting: Family

## 2022-09-06 DIAGNOSIS — M542 Cervicalgia: Secondary | ICD-10-CM

## 2022-09-16 ENCOUNTER — Other Ambulatory Visit: Payer: Self-pay | Admitting: *Deleted

## 2022-09-16 DIAGNOSIS — Z79899 Other long term (current) drug therapy: Secondary | ICD-10-CM

## 2022-09-16 LAB — CBC WITH DIFFERENTIAL/PLATELET
Absolute Monocytes: 507 cells/uL (ref 200–950)
Basophils Absolute: 39 cells/uL (ref 0–200)
Basophils Relative: 1.4 %
Eosinophils Absolute: 81 cells/uL (ref 15–500)
Eosinophils Relative: 2.9 %
HCT: 37.6 % (ref 35.0–45.0)
Hemoglobin: 12.6 g/dL (ref 11.7–15.5)
Lymphs Abs: 1198 cells/uL (ref 850–3900)
MCH: 31.5 pg (ref 27.0–33.0)
MCHC: 33.5 g/dL (ref 32.0–36.0)
MCV: 94 fL (ref 80.0–100.0)
MPV: 9.2 fL (ref 7.5–12.5)
Monocytes Relative: 18.1 %
Neutro Abs: 974 cells/uL — ABNORMAL LOW (ref 1500–7800)
Neutrophils Relative %: 34.8 %
Platelets: 277 10*3/uL (ref 140–400)
RBC: 4 10*6/uL (ref 3.80–5.10)
RDW: 12.6 % (ref 11.0–15.0)
Total Lymphocyte: 42.8 %
WBC: 2.8 10*3/uL — ABNORMAL LOW (ref 3.8–10.8)

## 2022-09-17 NOTE — Progress Notes (Signed)
WBC count remains low-3.8.  absolute neutrophils remain low and continue to trend down.  We will continue to monitor lab work closely.

## 2022-09-18 ENCOUNTER — Encounter: Payer: Self-pay | Admitting: *Deleted

## 2022-09-26 ENCOUNTER — Encounter: Payer: Self-pay | Admitting: Family

## 2022-09-26 ENCOUNTER — Ambulatory Visit (INDEPENDENT_AMBULATORY_CARE_PROVIDER_SITE_OTHER): Payer: Commercial Managed Care - PPO | Admitting: Family

## 2022-09-26 VITALS — BP 143/87 | HR 77 | Temp 97.5°F | Ht 61.0 in | Wt 159.2 lb

## 2022-09-26 DIAGNOSIS — M778 Other enthesopathies, not elsewhere classified: Secondary | ICD-10-CM | POA: Diagnosis not present

## 2022-09-26 MED ORDER — MELOXICAM 7.5 MG PO TABS: 7.5000 mg | ORAL_TABLET | Freq: Every day | ORAL | 2 refills | Status: DC

## 2022-09-26 NOTE — Progress Notes (Unsigned)
Patient ID: [name], female    DOB: 06-23-1976, 47 y.o.   MRN: [id number]  Chief Complaint  Patient presents with   Arm Pain    Pt c/o Right arm for more than a month. No injury, Pain started in elbow then goes down to hand. Has tried a brace which does not help. Also using meloxicam taking 2 which does help.     HPI:      Right arm pain:  Pt c/o Right arm for more than a month. No injury, Pain started in elbow then goes down to hand. Has tried a brace which does not help. Also using meloxicam taking 2 which does help.       Assessment & Plan:  1. Tendonitis of elbow, right - refilling Mobic. Advised on icing elbow for tid, and before & after exercises, reviewed and handout attached. Advised to purchase a tendon strap, instructed pt on how to apply, how often. Advised to not grip handles, steering wheel, etc, tightly, can take up to 6 mos to completely resolve. Can refer to PT if desired.  - meloxicam (MOBIC) 7.5 MG tablet; Take 1-2 tablets (7.5-15 mg total) by mouth daily. OR take as needed for pain.  Dispense: 45 tablet; Refill: 2   Subjective:    Outpatient Medications Prior to Visit  Medication Sig Dispense Refill   hydroxychloroquine (PLAQUENIL) 200 MG tablet Take 1 tablet (200 mg total) by mouth daily. 90 tablet 0   meclizine (ANTIVERT) 12.5 MG tablet Take 1 tablet (12.5 mg total) by mouth 3 (three) times daily as needed for dizziness. 30 tablet 0   meloxicam (MOBIC) 7.5 MG tablet TAKE 1 TABLET (7.5 MG TOTAL) BY MOUTH DAILY. OR TAKE AS NEEDED FOR PAIN. 30 tablet 2   Multiple Vitamin (MULTIVITAMIN) tablet Take 1 tablet by mouth daily.     vitamin C (ASCORBIC ACID) 500 MG tablet Take 500 mg by mouth daily.     norethindrone-ethinyl estradiol (LOESTRIN) 1-20 MG-MCG tablet TAKE 1 TABLET BY MOUTH DAILY (Patient not taking: Reported on 09/26/2022) 21 tablet 0   aspirin-acetaminophen-caffeine (EXCEDRIN MIGRAINE) 250-250-65 MG tablet Take 2 tablets by mouth every 6  (six) hours as needed for headache. (Patient not taking: Reported on 09/26/2022)     No facility-administered medications prior to visit.   Past Medical History:  Diagnosis Date   Abnormal uterine bleeding    History of chicken pox    Sjogren's disease (HCC)    Urinary tract infection 12/2014   Vaginal Pap smear, abnormal    Past Surgical History:  Procedure Laterality Date   CESAREAN SECTION     LEEP     No Known Allergies    Objective:    Physical Exam Vitals and nursing note reviewed.  Constitutional:      Appearance: Normal appearance.  Cardiovascular:     Rate and Rhythm: Normal rate and regular rhythm.  Pulmonary:     Effort: Pulmonary effort is normal.     Breath sounds: Normal breath sounds.  Musculoskeletal:        General: Normal range of motion.     Right elbow: Swelling (mild) present. Normal range of motion. Tenderness present in lateral epicondyle.  Skin:    General: Skin is warm and dry.  Neurological:     Mental Status: She is alert.  Psychiatric:        Mood and Affect: Mood normal.        Behavior: Behavior normal.    BP Marland Kitchen)  143/87 (BP Location: Left Arm, Patient Position: Sitting, Cuff Size: Large)   Pulse 77   Temp (!) 97.5 F (36.4 C) (Temporal)   Ht 5\' 1"  (1.549 m)   Wt 159 lb 4 oz (72.2 kg)   LMP  (LMP Unknown)   SpO2 97%   BMI 30.09 kg/m  Wt Readings from Last 3 Encounters:  09/26/22 159 lb 4 oz (72.2 kg)  06/25/22 162 lb (73.5 kg)  06/24/22 162 lb (73.5 kg)       Dulce Sellar, NP

## 2022-09-26 NOTE — Patient Instructions (Addendum)
  Fue muy lindo verte hoy!   Busque la correa del tendn y Adair para brindarle 22 y Therapist, occupational al usar la mano o el brazo. Aplique hielo durante 20 minutos al menos 3 veces al da. Haz los ejercicios adjuntos diariamente. Est bien seguir tomando Meloxicam diariamente para ayudar a Dietitian. Avseme si el dolor sigue sin mejorar despus de 4 semanas.

## 2022-11-13 NOTE — Progress Notes (Signed)
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 female with Sjogren's and osteoarthritis.  She states she is doing much better since she has been taking hydroxychloroquine 1 tablet daily as she is experiencing less side effects from the hydroxychloroquine.  She gives history of dry mouth and dry eyes which is manageable with over-the-counter products.  She states she continues to have some discomfort in her right elbow joint which she relates to her work.  She has been also experiencing some discomfort in the right SI joint.  She denies any radiculopathy.  There is no history of oral ulcers, nasal ulcers, malar rash, photosensitivity or lymphadenopathy.  She gives history of Raynaud's phenomenon and hair loss.    Activities of Daily Living:  Patient reports morning stiffness for a few minutes.   Patient Reports nocturnal pain.  Difficulty dressing/grooming: Denies Difficulty climbing stairs: Denies Difficulty getting out of chair: Denies Difficulty using hands for taps, buttons, cutlery, and/or writing: Denies  Review of Systems  Constitutional:  Positive for fatigue.  HENT:  Positive for mouth dryness. Negative for mouth sores.   Eyes:  Positive for dryness.  Respiratory:  Negative for shortness of breath.   Cardiovascular:  Negative for chest pain and palpitations.  Gastrointestinal:  Positive for constipation. Negative for blood in stool and diarrhea.  Endocrine: Negative for increased urination.  Genitourinary:  Negative for involuntary urination.  Musculoskeletal:  Positive for joint pain, joint pain, myalgias, morning stiffness and myalgias. Negative for  gait problem, joint swelling, muscle weakness and muscle tenderness.  Skin:  Positive for color change and sensitivity to sunlight. Negative for rash.  Allergic/Immunologic: Negative for susceptible to infections.  Neurological:  Positive for numbness and headaches. Negative for dizziness.  Hematological:  Negative for swollen glands.  Psychiatric/Behavioral:  Positive for sleep disturbance. Negative for depressed mood. The patient is not nervous/anxious.     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    Past Medical History:  Diagnosis Date   Abnormal uterine bleeding    History of chicken pox    Sjogren's disease (HCC)    Urinary tract infection 12/2014   Vaginal Pap smear, abnormal     Family History  Problem Relation Age of Onset   Diabetes Mother    Hypertension Mother    High Cholesterol Mother    Healthy Sister    Healthy Brother    Healthy Brother    Breast cancer Paternal Grandmother    Healthy Son    Healthy Son    Healthy Son    Past Surgical History:  Procedure Laterality Date   CESAREAN SECTION     LEEP  Social History   Social History Narrative   Not on file   Immunization History  Administered Date(s) Administered   Influenza,inj,Quad PF,6+ Mos 06/24/2018, 04/08/2019, 07/25/2020, 05/17/2021, 06/13/2022   PFIZER(Purple Top)SARS-COV-2 Vaccination 07/31/2020, 08/21/2020   Pneumococcal Polysaccharide-23 03/25/2018   Tdap 11/23/2014     Objective: Vital Signs: BP 136/88 (BP Location: Left Arm, Patient Position: Sitting, Cuff Size:  Normal)   Pulse 80   Resp 14   Ht 5\' 1"  (1.549 m)   Wt 156 lb 9.6 oz (71 kg)   BMI 29.59 kg/m    Physical Exam Vitals and nursing note reviewed.  Constitutional:      Appearance: She is well-developed.  HENT:     Head: Normocephalic and atraumatic.  Eyes:     Conjunctiva/sclera: Conjunctivae normal.  Cardiovascular:     Rate and Rhythm: Normal rate and regular rhythm.     Heart sounds: Normal heart sounds.  Pulmonary:     Effort: Pulmonary effort is normal.     Breath sounds: Normal breath sounds.  Abdominal:     General: Bowel sounds are normal.     Palpations: Abdomen is soft.  Musculoskeletal:     Cervical back: Normal range of motion.  Lymphadenopathy:     Cervical: No cervical adenopathy.  Skin:    General: Skin is warm and dry.     Capillary Refill: Capillary refill takes less than 2 seconds.  Neurological:     Mental Status: She is alert and oriented to person, place, and time.  Psychiatric:        Behavior: Behavior normal.      Musculoskeletal Exam: Cervical, thoracic and lumbar spine were in good range of motion.  She had mild tenderness over right SI joint.  Shoulder joints, elbow joints, wrist joints, MCPs PIPs and DIPs were in good range of motion.  She had tenderness over right lateral epicondyle region.  Hip joints and knee joints in good range of motion.  There was no tenderness over ankles or MTPs.  CDAI Exam: CDAI Score: -- Patient Global: --; Provider Global: -- Swollen: --; Tender: -- Joint Exam 11/27/2022   No joint exam has been documented for this visit   There is currently no information documented on the homunculus. Go to the Rheumatology activity and complete the homunculus joint exam.  Investigation: No additional findings.  Imaging: No results found.  Recent Labs: Lab Results  Component Value Date   WBC 2.8 (L) 09/16/2022   HGB 12.6 09/16/2022   PLT 277 09/16/2022   NA 137 06/24/2022   K 4.3 06/24/2022   CL 104 06/24/2022    CO2 27 06/24/2022   GLUCOSE 99 06/24/2022   BUN 14 06/24/2022   CREATININE 0.71 06/24/2022   BILITOT 0.7 06/24/2022   ALKPHOS 62 08/09/2020   AST 27 06/24/2022   ALT 18 06/24/2022   PROT 7.9 06/24/2022   PROT 7.8 06/24/2022   ALBUMIN 4.4 08/09/2020   CALCIUM 9.4 06/24/2022   GFRAA 125 07/11/2020    Speciality Comments: PLQ Eye Exam:08/27/2022 WNL @ [facility] Follow up in 1 year  Procedures:  No procedures performed Allergies: Patient has no known allergies.   Assessment / Plan:     Visit Diagnoses: Sjogren's syndrome with other organ involvement (HCC) - ANA 1:1280, Ro>8, RF negative, sicca sx, arthralgias, neutropenia, hypocomplementemia: -She continues to have dry mouth and dry eye symptoms.  Her symptoms are manageable with Plaquenil and over-the-counter medications.  She states she has noticed less  side effects from Plaquenil since she has been taking reduced doses 1 tablet by mouth daily.  She denies any history of oral ulcers, nasal ulcers, malar rash, photosensitivity.  She gives history of Raynaud's phenomenon when she is exposed to the colder temperatures.  She continues to have some hair thinning.  Labs obtained on June 24, 2022 white cell count was 2.7.  CMP was normal.  Sed rate and complements were normal.  I will obtain labs today.  Plan: Protein / creatinine ratio, urine, Anti-DNA antibody, double-stranded, C3 and C4, Sedimentation rate, Urinalysis, Routine w reflex microscopic, Sjogrens syndrome-A extractable nuclear antibody.  Increased risk of interstitial lung disease with Sjogren's and increased risk of lymphoma with Sjogren's was discussed.  Use of sunscreen and sun protection was discussed.  High risk medication use - Plaquenil 200 mg 1 tablet by mouth daily . PLQ Eye Exam:08/27/2022 -white cell count improved to 2.8 on September 16, 2022.  She has chronic neutropenia.  Will check labs today.  Plan: CBC with Differential/Platelet, COMPLETE METABOLIC PANEL WITH  GFR.  Information on immunization was placed in the AVS.  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  She still continues to have some discomfort in her right elbow.  Use of tennis elbow brace and topical NSAIDs was discussed.  Primary osteoarthritis of both knees-she had good range of motion of her knee joints without any warmth swelling or effusion.  She denies any discomfort in her knee joints..  Primary osteoarthritis of both feet-she denies discomfort today.  Chronic SI joint pain-she had tenderness over right SI joint.  Stretching exercises were demonstrated in the office today.  Chronic left-sided low back pain with left-sided sciatica-she is doing better with the stretching exercises.  Cervicalgia-she is off discomfort in her cervical region.  She had no discomfort with range of motion today.  Other fatigue-she continues to have fatigue.  Language barrier-interpreter was present throughout the visit  Orders: Orders Placed This Encounter  Procedures   Protein / creatinine ratio, urine   CBC with Differential/Platelet   COMPLETE METABOLIC PANEL WITH GFR   Anti-DNA antibody, double-stranded   C3 and C4   Sedimentation rate   Urinalysis, Routine w reflex microscopic   Sjogrens syndrome-A extractable nuclear antibody   No orders of the defined types were placed in this encounter.    Follow-Up Instructions: Return in about 5 months (around 04/29/2023) for Sjogren's.   Pollyann Savoy, MD  Note - This record has been created using Animal nutritionist.  Chart creation errors have been sought, but may not always  have been located. Such creation errors do not reflect on  the standard of medical care.

## 2022-11-15 ENCOUNTER — Other Ambulatory Visit: Payer: Self-pay | Admitting: Physician Assistant

## 2022-11-17 NOTE — Telephone Encounter (Signed)
Last Fill: 08/19/2022  Eye exam: 08/27/2022    Labs: 06/24/2022 WBC count is low-2.7-trending down. absolute neutrophils are low-improved.  Rest of CBC WNL. Recheck CBC with diff in 1 month. CMP WNL. ESR and complements WNL. RF negative.   UA revealed trace hemoglobin 08/11/2022 CBC w/diff: Please confirm if patient is taking hydroxychloroquine 1 tablet twice a day Monday to Friday if she is taking it on a regular basis that she should reduce Plaquenil to 1 tablet p.o. daily.  Recheck CBC in 1 month.  09/16/2022 CBC w/diff: WBC count remains low-3.8.  absolute neutrophils remain low and continue to trend down.  We will continue to monitor lab work closely.   Next Visit: 11/27/2022  Last Visit: 06/24/2022  ZO:[id number] syndrome with other organ involvement   Current Dose per office note on 06/24/2022: Plaquenil 200 mg 1 tablet by mouth twice daily Monday through Friday only.   Okay to refill Plaquenil?

## 2022-11-27 ENCOUNTER — Encounter: Payer: Self-pay | Admitting: Rheumatology

## 2022-11-27 ENCOUNTER — Ambulatory Visit: Payer: Commercial Managed Care - PPO | Attending: Rheumatology | Admitting: Rheumatology

## 2022-11-27 VITALS — BP 136/88 | HR 80 | Resp 14 | Ht 61.0 in | Wt 156.6 lb

## 2022-11-27 DIAGNOSIS — G8929 Other chronic pain: Secondary | ICD-10-CM

## 2022-11-27 DIAGNOSIS — M19071 Primary osteoarthritis, right ankle and foot: Secondary | ICD-10-CM

## 2022-11-27 DIAGNOSIS — M7711 Lateral epicondylitis, right elbow: Secondary | ICD-10-CM

## 2022-11-27 DIAGNOSIS — R5383 Other fatigue: Secondary | ICD-10-CM

## 2022-11-27 DIAGNOSIS — M542 Cervicalgia: Secondary | ICD-10-CM

## 2022-11-27 DIAGNOSIS — M533 Sacrococcygeal disorders, not elsewhere classified: Secondary | ICD-10-CM

## 2022-11-27 DIAGNOSIS — Z79899 Other long term (current) drug therapy: Secondary | ICD-10-CM | POA: Diagnosis not present

## 2022-11-27 DIAGNOSIS — M5442 Lumbago with sciatica, left side: Secondary | ICD-10-CM

## 2022-11-27 DIAGNOSIS — Z862 Personal history of diseases of the blood and blood-forming organs and certain disorders involving the immune mechanism: Secondary | ICD-10-CM

## 2022-11-27 DIAGNOSIS — M3509 Sicca syndrome with other organ involvement: Secondary | ICD-10-CM

## 2022-11-27 DIAGNOSIS — Z603 Acculturation difficulty: Secondary | ICD-10-CM

## 2022-11-27 DIAGNOSIS — M170 Bilateral primary osteoarthritis of knee: Secondary | ICD-10-CM

## 2022-11-27 DIAGNOSIS — M19072 Primary osteoarthritis, left ankle and foot: Secondary | ICD-10-CM

## 2022-11-27 DIAGNOSIS — Z758 Other problems related to medical facilities and other health care: Secondary | ICD-10-CM

## 2022-11-27 NOTE — Patient Instructions (Signed)
Vaccines You are taking a medication(s) that can suppress your immune system.  The following immunizations are recommended: Flu annually Covid-19  Td/Tdap (tetanus, diphtheria, pertussis) every 10 years Pneumonia (Prevnar 15 then Pneumovax 23 at least 1 year apart.  Alternatively, can take Prevnar 20 without needing additional dose) Shingrix: 2 doses from 4 weeks to 6 months apart  Please check with your PCP to make sure you are up to date.     Ejercicios del codo y Glass blower/designer  Pregunte al mdico qu ejercicios son seguros para usted. Haga los ejercicios exactamente como se lo haya dicho el mdico y gradelos como se lo haya indicado. Es normal sentir un leve estiramiento, tironeo, opresin o Dentist al [employer] ejercicios. Detngase de inmediato si siente un dolor repentino o Community education officer. No comience a hacer estos ejercicios hasta que se lo indique el mdico. Ejercicios de amplitud de movimientos Estos ejercicios calientan los msculos y las articulaciones, y mejoran la movilidad y la flexibilidad del codo y el antebrazo lesionados. Los ejercicios tambin ayudan a Engineer, materials, el adormecimiento y el hormigueo. Estos ejercicios se hacen con los msculos del codo y el antebrazo lesionados [place]). Flexin del codo, activa  Mantenga su brazo izquierdo/derecho al [employer] del cuerpo y doble el codo (flexin) todo lo que pueda, usando solamente los msculos del brazo. Mantenga esta posicin durante ___________ segundos. Vuelva lentamente a la posicin inicial. Repita __________ veces. Realice este ejercicio __________ veces al da. Extensin del codo, Printmaker  Mantenga el brazo izquierdo/derecho al costado del cuerpo y extienda el codo (extensin) todo lo que pueda usando solamente los msculos del brazo izquierdo/derecho. Mantenga esta posicin durante ___________ segundos. Vuelva lentamente a la posicin inicial. Repita __________ veces. Realice este ejercicio __________  veces al da. Rotacin del Product manager, supinacin Este es un ejercicio en el que se gira (rota) el antebrazo con la palma hacia arriba (supinacin). Prese o sintese con los codos a los costados del cuerpo. Flexione el codo izquierdo/derecho en un ngulo de 90 grados (ngulo recto). Coloque el antebrazo de modo que el pulgar quede hacia el techo (posicin [place]). Rote la palma Normajean [place] Tomasita Crumble, hacia al [place] sentir un leve estiramiento en la zona interna del Product manager. Si se lo indican, use la otra mano como ayuda para rotar an ms el International aid/development worker sentir un estiramiento de leve a moderado. Mantenga esta posicin durante ___________ segundos. Vuelva lentamente a la posicin inicial. Repita __________ veces. Realice este ejercicio __________ veces al da. Rotacin del Product manager, pronacin Este es un ejercicio en el que se gira (rota) el antebrazo con la palma hacia abajo (pronacin). Prese o sintese con los codos a los costados del cuerpo. Flexione el codo izquierdo/derecho en un ngulo de 90 grados (ngulo recto). Coloque el antebrazo de modo que el pulgar quede hacia el techo (posicin [place]). Gire la palma hacia abajo hasta sentir un leve estiramiento en la parte superior del antebrazo. Si se lo indican, use la otra mano como ayuda para rotar an ms el International aid/development worker sentir un estiramiento de leve a moderado. Mantenga esta posicin durante ___________ segundos. Vuelva lentamente a la posicin inicial. Repita __________ veces. Realice este ejercicio __________ veces al da. Ejercicios de estiramiento Estos ejercicios calientan los msculos y las articulaciones, y mejoran la movilidad y la flexibilidad del codo y el antebrazo lesionados. Estos ejercicios tambin ayudan a Engineer, materials, el adormecimiento y el hormigueo. Estos ejercicios se hacen con el codo y Glass blower/designer sanos para  ayudar a Paramedic del codo y el antebrazo lesionados (activos-asistidos). Flexin del  codo, activo-asistido  Mantenga el brazo izquierdo/derecho al [employer] del cuerpo y doble el codo (flexin) todo lo que pueda usando los msculos del brazo izquierdo/derecho. Use la otra mano para flexionar an ms el codo izquierdo/derecho. Para hace esto empuje suavemente el antebrazo Normajean [place] Tomasita Crumble, hasta sentir un suave estiramiento en la parte de atrs del codo. Mantenga esta posicin durante ___________ segundos. Vuelva lentamente a la posicin inicial. Repita __________ veces. Realice este ejercicio __________ veces al da. Extensin del codo, activo-asistido  Mantenga el brazo izquierdo/derecho al costado del cuerpo y Retail buyer codo (extensin) todo lo que pueda usando los msculos del brazo izquierdo/derecho. Use la otra mano para extender an ms el codo izquierdo/derecho. Hgalo empujando suavemente el antebrazo hacia abajo, hasta sentir un suave estiramiento en la parte interna del codo. Mantenga esta posicin durante ___________ segundos. Vuelva lentamente a la posicin inicial. Repita __________ veces. Realice este ejercicio __________ veces al da. Flexin pasiva del codo, en decbito supino  Acustese boca arriba (en decbito supino). Extienda el brazo izquierdo/derecho en el aire, abrazndolo con la [place]. Deje que la mano izquierda/derecha baje lentamente hacia el hombro (flexin pasiva), mientras el codo sigue apuntando hacia el techo. Debe sentir Isabella Bowens elongacin en la parte posterior de la zona superior del brazo y en el codo. Si se lo ha indicado el mdico, puede agregar un pequeo peso a la [place] o a la mano para aumentar la intensidad de Management consultant. Mantenga esta posicin durante ___________ segundos. Vuelva lentamente a la posicin inicial. Repita __________ veces. Realice este ejercicio __________ veces al da. Extensin pasiva del codo, en decbito supino  Acustese boca arriba (en decbito supino). Asegrese de estar en una posicin cmoda que le  permita relajar los msculos del brazo. Coloque una toalla doblada debajo de la zona superior del brazo izquierdo/derecho de modo que el codo y el hombro estn a la misma altura. Extienda el brazo izquierdo/derecho de forma que el codo no quede apoyado en la cama o en la toalla. Deje que el peso de la mano estire el codo (extensin pasiva). Mantenga los msculos del brazo y del pecho relajados. Debe sentir un estiramiento suave en la parte de adentro del codo. Si se lo ha indicado el mdico, puede agregar un pequeo peso a la [place] o a la mano para aumentar la intensidad de Management consultant. Mantenga esta posicin durante ___________ segundos. Afloje lentamente el estiramiento. Repita __________ veces. Realice este ejercicio __________ veces al da. Ejercicios de fortalecimiento Estos ejercicios fortalecen el codo y Glass blower/designer, y les otorgan resistencia. La resistencia es la capacidad de usar los msculos durante un tiempo prolongado, incluso despus de que se cansen. Flexin del codo, isomtrica  Sintese o prese erguido. Flexione el codo izquierdo/derecho en un ngulo de 90 grados (ngulo recto). Mantenga el antebrazo a la altura de la cintura. El pulgar debe apuntar hacia el techo (antebrazo neutral). Coloque la otra mano sobre el antebrazo izquierdo/derecho. Empuje suavemente hacia abajo mientras hace fuerza en direccin opuesta con el brazo izquierdo/derecho (flexin isomtrica). Empuje tan fuerte como pueda con ambos brazos sin causar ningn dolor ni realizar movimientos con el codo izquierdo/derecho. Mantenga esta posicin durante ___________ segundos. Lentamente, afloje la tensin en ambos brazos. Relaje totalmente los msculos antes de repetir el ejercicio. Repita __________ veces. Realice este ejercicio __________ veces al da. Extensin del codo, isomtrica  Sintese o prese erguido. Coloque el brazo  izquierdo/derecho de [employer] la mano quede con la palma hacia el abdomen y a la  altura de la cintura. Coloque la otra mano en el lado inferior del antebrazo izquierdo/derecho. Empuje suavemente hacia arriba mientras hace fuerza en direccin opuesta con el brazo izquierdo/derecho (extensin isomtrica). Empuje tan fuerte como pueda con ambos brazos sin causar ningn dolor ni realizar movimientos con el codo izquierdo/derecho. Mantenga esta posicin durante ___________ segundos. Lentamente, afloje la tensin en ambos brazos. Relaje totalmente los msculos antes de repetir el ejercicio. Repita __________ veces. Realice este ejercicio __________ veces al da. Flexin del codo con la palma y el antebrazo hacia arriba  Sintese en una silla firme que no tenga apoyabrazos, o pngase de pie. Coloque el brazo izquierdo/derecho al [employer] del cuerpo, con el codo extendido y la palma hacia adelante. Sostenga una pesa de __________ o agarre Annamaria Boots de goma para ejercicios y flexione el codo para llevar la mano hacia el hombro (flexin). Mantenga esta posicin durante ___________ segundos. Vuelva lentamente a la posicin inicial. Repita __________ veces. Realice este ejercicio __________ veces al da. Extensin del codo, activa  Sintese en una silla firme que no tenga apoyabrazos, o pngase de pie. Sostenga un tubo o una banda de goma para ejercicios en ambas manos. Con los brazos a los lados del cuerpo, lleve ambas manos al hombro izquierdo/derecho. Mantenga la mano izquierda/derecha un poco ms abajo que la [place]. Enderece el codo izquierdo/derecho (extensin) mientras mantiene el otro brazo firme. Mantenga esta posicin durante ___________ segundos. Controle la resistencia de la banda o tubo mientras vuelve a la posicin inicial. Repita __________ veces. Realice este ejercicio __________ veces al da. Rotacin del antebrazo con [place], supinacin  Sintese con el antebrazo izquierdo/derecho apoyado en una mesa. El codo debe estar a la altura de la cintura y flexionado en un  ngulo de 90 grados (ngulo recto). Sostenga un martillo liviano. Apoye la [employer] borde de la mesa, con la palma [place]. Sin mover el codo izquierdo/derecho, gire lentamente el antebrazo para poner la palma hacia arriba, mirando al techo (supinacin). Mantenga esta posicin durante ___________ segundos. Vuelva lentamente a la posicin inicial. Repita __________ veces. Realice este ejercicio __________ veces al da. Rotacin del antebrazo con [place], pronacin  Sintese con el antebrazo izquierdo/derecho apoyado en una mesa. Mantenga el codo por debajo de la altura del hombro. Sostenga un martillo liviano. Apoye la [employer] borde de la [place], con la palma [place] arriba. Sin mover el codo izquierdo/derecho, gire Hydrographic surveyor para poner la palma hacia abajo, mirando al suelo (pronacin). Mantenga esta posicin durante ___________ segundos. Vuelva lentamente a la posicin inicial. Repita __________ veces. Realice este ejercicio __________ veces al da. Esta informacin no tiene Theme park manager el consejo del mdico. Asegrese de hacerle al mdico cualquier pregunta que tenga. Document Revised: 05/03/2022 Document Reviewed: 05/03/2022 Elsevier Patient Education  2024 [employer].

## 2022-11-28 ENCOUNTER — Other Ambulatory Visit: Payer: Self-pay | Admitting: Family

## 2022-11-28 DIAGNOSIS — M778 Other enthesopathies, not elsewhere classified: Secondary | ICD-10-CM

## 2022-12-01 LAB — URINALYSIS, ROUTINE W REFLEX MICROSCOPIC
Bilirubin Urine: NEGATIVE
Glucose, UA: NEGATIVE
Hgb urine dipstick: NEGATIVE
Ketones, ur: NEGATIVE
Leukocytes,Ua: NEGATIVE
Nitrite: NEGATIVE
Protein, ur: NEGATIVE
Specific Gravity, Urine: 1.003 (ref 1.001–1.035)
pH: 6 (ref 5.0–8.0)

## 2022-12-01 LAB — SJOGRENS SYNDROME-A EXTRACTABLE NUCLEAR ANTIBODY: SSA (Ro) (ENA) Antibody, IgG: >8 AI — AB

## 2022-12-01 LAB — CBC WITH DIFFERENTIAL/PLATELET
Absolute Monocytes: 462 cells/uL (ref 200–950)
Basophils Absolute: 41 cells/uL (ref 0–200)
Basophils Relative: 1.2 %
Eosinophils Absolute: 71 cells/uL (ref 15–500)
Eosinophils Relative: 2.1 %
HCT: 37.5 % (ref 35.0–45.0)
Hemoglobin: 12.6 g/dL (ref 11.7–15.5)
Lymphs Abs: 1163 cells/uL (ref 850–3900)
MCH: 31.4 pg (ref 27.0–33.0)
MCHC: 33.6 g/dL (ref 32.0–36.0)
MCV: 93.5 fL (ref 80.0–100.0)
MPV: 9.1 fL (ref 7.5–12.5)
Monocytes Relative: 13.6 %
Neutro Abs: 1663 cells/uL (ref 1500–7800)
Neutrophils Relative %: 48.9 %
Platelets: 298 10*3/uL (ref 140–400)
RBC: 4.01 10*6/uL (ref 3.80–5.10)
RDW: 12.8 % (ref 11.0–15.0)
Total Lymphocyte: 34.2 %
WBC: 3.4 10*3/uL — ABNORMAL LOW (ref 3.8–10.8)

## 2022-12-01 LAB — COMPLETE METABOLIC PANEL WITH GFR
AG Ratio: 1.5 (calc) (ref 1.0–2.5)
ALT: 16 U/L (ref 6–29)
AST: 21 U/L (ref 10–30)
Albumin: 4.6 g/dL (ref 3.6–5.1)
Alkaline phosphatase (APISO): 66 U/L (ref 31–125)
BUN: 11 mg/dL (ref 7–25)
CO2: 25 mmol/L (ref 20–32)
Calcium: 9.3 mg/dL (ref 8.6–10.2)
Chloride: 105 mmol/L (ref 98–110)
Creat: 0.71 mg/dL (ref 0.50–0.99)
Globulin: 3 g/dL (calc) (ref 1.9–3.7)
Glucose, Bld: 107 mg/dL — ABNORMAL HIGH (ref 65–99)
Potassium: 4.7 mmol/L (ref 3.5–5.3)
Sodium: 139 mmol/L (ref 135–146)
Total Bilirubin: 0.4 mg/dL (ref 0.2–1.2)
Total Protein: 7.6 g/dL (ref 6.1–8.1)
eGFR: 107 mL/min/{1.73_m2} (ref >=60)

## 2022-12-01 LAB — C3 AND C4
C3 Complement: 91 mg/dL (ref 83–193)
C4 Complement: 21 mg/dL (ref 15–57)

## 2022-12-01 LAB — SEDIMENTATION RATE: Sed Rate: 6 mm/h (ref 0–20)

## 2022-12-01 LAB — PROTEIN / CREATININE RATIO, URINE
Creatinine, Urine: 16 mg/dL — ABNORMAL LOW (ref 20–275)
Total Protein, Urine: <4 mg/dL — ABNORMAL LOW (ref 5–24)

## 2022-12-01 LAB — ANTI-DNA ANTIBODY, DOUBLE-STRANDED: ds DNA Ab: <1 IU/mL

## 2022-12-02 NOTE — Progress Notes (Signed)
Urine protein creatinine ratio is normal.  WBC count is low and stable.  CMP normal.  Ro antibody is positive and stable titer.  Double-stranded DNA negative, sed rate normal, complements normal, UA negative.  Labs do not indicate an autoimmune disease flare.  No change in treatment advised.

## 2023-01-17 ENCOUNTER — Other Ambulatory Visit: Payer: Self-pay | Admitting: Family

## 2023-01-17 DIAGNOSIS — M778 Other enthesopathies, not elsewhere classified: Secondary | ICD-10-CM

## 2023-02-05 ENCOUNTER — Other Ambulatory Visit: Payer: Self-pay | Admitting: Family

## 2023-02-05 DIAGNOSIS — M778 Other enthesopathies, not elsewhere classified: Secondary | ICD-10-CM

## 2023-02-13 ENCOUNTER — Other Ambulatory Visit: Payer: Self-pay | Admitting: Physician Assistant

## 2023-02-13 NOTE — Telephone Encounter (Signed)
Last Fill: 11/17/2022  Eye exam: 08/27/2022 WNL    Labs: 11/27/2022 WBC count is low and stable.  CMP normal   Next Visit: 04/30/2023  Last Visit: 11/27/2022  [id number] syndrome with other organ involvement   Current Dose per office note 11/27/2022:  Plaquenil 200 mg 1 tablet by mouth daily   Okay to refill Plaquenil?

## 2023-04-01 ENCOUNTER — Ambulatory Visit (INDEPENDENT_AMBULATORY_CARE_PROVIDER_SITE_OTHER): Payer: Commercial Managed Care - PPO | Admitting: Family

## 2023-04-01 ENCOUNTER — Encounter: Payer: Self-pay | Admitting: Family

## 2023-04-01 VITALS — BP 122/85 | HR 89 | Temp 97.2°F | Ht 61.0 in | Wt 154.1 lb

## 2023-04-01 DIAGNOSIS — Z23 Encounter for immunization: Secondary | ICD-10-CM

## 2023-04-01 DIAGNOSIS — N921 Excessive and frequent menstruation with irregular cycle: Secondary | ICD-10-CM | POA: Diagnosis not present

## 2023-04-01 DIAGNOSIS — M1990 Unspecified osteoarthritis, unspecified site: Secondary | ICD-10-CM | POA: Insufficient documentation

## 2023-04-01 DIAGNOSIS — N951 Menopausal and female climacteric states: Secondary | ICD-10-CM

## 2023-04-01 DIAGNOSIS — M19012 Primary osteoarthritis, left shoulder: Secondary | ICD-10-CM | POA: Diagnosis not present

## 2023-04-01 NOTE — Patient Instructions (Addendum)
  Fue muy lindo verte hoy!   Durante su visita, analizamos sus sntomas actuales de la menopausia, incluido el aumento de la frecuencia de sangrado, Educational psychologist, sofocos, sudores nocturnos, cambios de humor y disminucin de la libido. A pesar de siete meses de terapia combinada de estrgenos y Education officer, museum, sus sntomas han empeorado. Tambin has probado varios remedios naturales sin xito.  TU PLAN:  -SNTOMAS DE LA MENOPAUSIDAD: Sus sntomas de la menopausia incluyen sofocos, sudores nocturnos, cambios de humor y baja libido. Planeamos aumentar su dosis de estradiol y considerar derivarla a un gineclogo para un tratamiento adicional. Tambin sugerimos probar un suplemento de cohosh negro para los sofocos y los sudores nocturnos, y fomentar el ejercicio regular, pero no antes de Teacher, music. Contine evitando la cafena.  -IRREGULARIDADES MENSTRUALES: Ests experimentando una mayor frecuencia de sangrado menstrual y clicos. Hablamos sobre aumentar su dosis de estradiol, pero lo derivaremos a un gineclogo para mayor discusin y [place].  -BAJA LIBIDO: Tu libido ha disminuido significativamente. Envi una derivacin a un gineclogo para mayor discusin y TEFL teacher.   TENGA EN CUENTA:  Si solicitamos alguna referencia hoy, infrmenos si no recibi noticias de su oficina dentro de la prxima semana.

## 2023-04-01 NOTE — Progress Notes (Signed)
Patient ID: [name], female    DOB: 1976/05/06, 47 y.o.   MRN: [id number]  Chief Complaint  Patient presents with   Menopause    Pt c/o heat flashes during the night and unable to sleep, cycles 3 times a months and more painful.    *Discussed the use of AI scribe software for clinical note transcription with the patient, who gave verbal consent to proceed.  History of Present Illness   The patient, with a history of peri-menopause sx for 2y, presents with worsening symptoms despite seven months of combined estrogen and progesterone therapy, but then she stopped taking about 1-23mos ago as was not helping all of her sx, though did help the bleeding & pain. She now reports increased frequency of bleeding, now occurring two to three times a month, associated with cramping and pain. She also experiences hot flashes and night sweats, leading to sleep disturbances and mood changes. The patient's house is kept cold, yet she still feels hot. She also reports a significant decrease in libido, describing it as "over there is dead." She has tried various natural remedies including Maca powder without success. Pt also has Sjogren's and osteoarthritis, taking Plaquenil daily.     Assessment & Plan:     Menopausal Symptoms - Worsening hot flashes, night sweats, mood changes, and low libido despite previous hormone therapy Loestrin 1- 20. Increased frequency of menstrual bleeding and cramping since off of OCP. - Sending referral to gynecology for further management. -Try black cohosh supplement (500mg  three times a day) for hot flashes and night sweats. -Encourage regular exercise, but not before bedtime. -Continue to avoid caffeine, be careful with the teas, these can have caffeine.  Menstrual Irregularities - Increased frequency of menstrual bleeding and cramping since stopping hormone therapy. -Discussed restarting OCP,  increasing estradiol dose, or trying alternative hormone tx like  IUD or Nexplanon. -Sending referral to gynecology for further discussion & management.  Low Libido - No improvement despite previous hormone therapy and use of Maca powder. --Sending referral to gynecology for further management.      Immunization due -     Flu vaccine trivalent PF, 6mos and older(Flulaval,Afluria,Fluarix,Fluzone)   Subjective:    Outpatient Medications Prior to Visit  Medication Sig Dispense Refill   hydroxychloroquine (PLAQUENIL) 200 MG tablet TAKE 1 TABLET BY MOUTH EVERY DAY 30 tablet 2   meloxicam (MOBIC) 7.5 MG tablet TAKE 1-2 TABLETS (7.5-15 MG TOTAL) BY MOUTH DAILY. OR TAKE AS NEEDED FOR PAIN. 45 tablet 2   Multiple Vitamin (MULTIVITAMIN) tablet Take 1 tablet by mouth daily.     vitamin C (ASCORBIC ACID) 500 MG tablet Take 500 mg by mouth daily.     norethindrone-ethinyl estradiol (LOESTRIN) 1-20 MG-MCG tablet TAKE 1 TABLET BY MOUTH DAILY (Patient not taking: Reported on 04/01/2023) 21 tablet 0   No facility-administered medications prior to visit.   Past Medical History:  Diagnosis Date   Abnormal uterine bleeding    History of chicken pox    Sjogren's disease (HCC)    Urinary tract infection 12/2014   Vaginal Pap smear, abnormal    Past Surgical History:  Procedure Laterality Date   CESAREAN SECTION     LEEP     No Known Allergies    Objective:    Physical Exam Vitals and nursing note reviewed.  Constitutional:      Appearance: Normal appearance.  Cardiovascular:     Rate and Rhythm: Normal rate and regular rhythm.  Pulmonary:  Effort: Pulmonary effort is normal.     Breath sounds: Normal breath sounds.  Musculoskeletal:        General: Normal range of motion.  Skin:    General: Skin is warm and dry.  Neurological:     Mental Status: She is alert.  Psychiatric:        Mood and Affect: Mood normal.        Behavior: Behavior normal.    BP 122/85 (BP Location: Left Arm, Patient Position: Sitting, Cuff Size: Large)   Pulse 89    Temp (!) 97.2 F (36.2 C) (Temporal)   Ht 5\' 1"  (1.549 m)   Wt 154 lb 2 oz (69.9 kg)   LMP 03/25/2023 (Approximate)   SpO2 100%   BMI 29.12 kg/m  Wt Readings from Last 3 Encounters:  04/01/23 154 lb 2 oz (69.9 kg)  11/27/22 156 lb 9.6 oz (71 kg)  09/26/22 159 lb 4 oz (72.2 kg)       Dulce Sellar, NP

## 2023-04-17 NOTE — Progress Notes (Unsigned)
Office Visit Note  Patient: [name]             Date of Birth: [birth year]           MRN: [id number]             PCP: Dulce Sellar, NP Referring: Dulce Sellar, NP Visit Date: 04/30/2023 Occupation: @GUAROCC @  Subjective:  Right elbow pain   History of Present Illness: [name] is a 47 y.o. female with history of sjogren's syndrome and osteoarthritis.  Patient remains on  Plaquenil 200 mg 1 tablet by mouth daily.  She continues to tolerate Plaquenil without any side effects and has not missed any doses recently.  Patient states that she has been experiencing increased fatigue which he attributes to interrupted sleep at night.  She has been having more interrupted sleep at night which she attributes to being perimenopausal.  Patient states for the past 6 to 7 months she has been experiencing pain in her right elbow.  Patient states that the pain is radiating down her right arm.  She denies any numbness at this time.  Patient has not been taking meloxicam recently due to requiring a refill from her PCP.  Patient states that her right elbow joint pain did not improve while taking meloxicam.  She has occasional discomfort in her left knee joint but denies any other joint pain or joint swelling at this time.  Patient continues to have chronic sicca symptoms which have been manageable with the use of over-the-counter products.  She has noticed some increased eye pain and light sensitivity for the past 2 weeks.  She has an upcoming eye appointment in November 2024 for further evaluation.  She denies any eye redness and has been using eyedrops daily.  She has occasional sores in her mouth but no nasal ulcers.  She has had intermittent symptoms of Raynaud's in her fingertips which she attributes to cooler weather temperatures.  She denies any digital ulcers.  She denies any other new or worsening symptoms.     Activities of Daily Living:  Patient reports morning  stiffness for 4 minutes.   Patient Reports nocturnal pain.  Difficulty dressing/grooming: Denies Difficulty climbing stairs: Reports Difficulty getting out of chair: Denies Difficulty using hands for taps, buttons, cutlery, and/or writing: Reports  Review of Systems  Constitutional:  Positive for fatigue.  HENT:  Positive for mouth sores and mouth dryness.   Eyes:  Positive for pain and dryness.  Respiratory:  Negative for shortness of breath.   Cardiovascular:  Negative for chest pain and palpitations.  Gastrointestinal:  Positive for constipation. Negative for blood in stool and diarrhea.  Endocrine: Negative for increased urination.  Genitourinary:  Negative for involuntary urination.  Musculoskeletal:  Positive for joint pain, joint pain, joint swelling, myalgias, morning stiffness, muscle tenderness and myalgias. Negative for gait problem and muscle weakness.  Skin:  Positive for color change, rash, hair loss and sensitivity to sunlight.  Allergic/Immunologic: Negative for susceptible to infections.  Neurological:  Positive for dizziness and headaches.  Hematological:  Positive for swollen glands.  Psychiatric/Behavioral:  Positive for sleep disturbance. Negative for depressed mood. The patient is nervous/anxious.     PMFS History:  Patient Active Problem List   Diagnosis Date Noted  . Abnormal uterine bleeding (AUB) 04/28/2023  . Hot flashes 04/28/2023  . Osteoarthritis 04/01/2023  . 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 Perimenopausal symptoms 05/17/2021  . Popliteal cyst, left 12/13/2019  . Depression, major, single episode, moderate (HCC)  07/25/2019  . Sjogren's disease (HCC) 03/25/2018  . Neutropenia (HCC) 01/04/2018  . 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 Abnormal uterine bleeding   . Epigastric pain 10/18/2021  . History of chicken pox   . Pain in joint of left shoulder 06/25/2022  . Sjogren's  disease (HCC)   . Urinary tract infection 12/2014  . Vaginal Pap smear, abnormal   . Vertigo 10/18/2021    Family History  Problem Relation Age of Onset  . Diabetes Mother   . Hypertension Mother   . High Cholesterol Mother   . Healthy Sister   . Healthy Brother   . Healthy Brother   . Breast cancer Paternal Grandmother   . Healthy Son   . Healthy Son   . Healthy Son    Past Surgical History:  Procedure Laterality Date  . CESAREAN SECTION    . LEEP     Social History   Social History Narrative  . Not on file   Immunization History  Administered Date(s) Administered  . Influenza, Seasonal, Injecte, Preservative Fre 04/01/2023  . Influenza,inj,Quad PF,6+ Mos 06/24/2018, 04/08/2019, 07/25/2020, 05/17/2021, 06/13/2022  . PFIZER(Purple Top)SARS-COV-2 Vaccination 07/31/2020, 08/21/2020  . Pneumococcal Polysaccharide-23 03/25/2018  . Tdap 11/23/2014     Objective: Vital Signs: BP 127/88 (BP Location: Left Arm, Patient Position: Sitting, Cuff Size: Normal)   Pulse 90   Resp 15   Ht 5\' 1"  (1.549 m)   Wt 158 lb (71.7 kg)   LMP 04/19/2023 (Exact Date)   BMI 29.85 kg/m    Physical Exam Vitals and nursing note reviewed.  Constitutional:      Appearance: She is well-developed.  HENT:     Head: Normocephalic and atraumatic.  Eyes:     Conjunctiva/sclera: Conjunctivae normal.  Cardiovascular:     Rate and Rhythm: Normal rate and regular rhythm.     Heart sounds: Normal heart sounds.  Pulmonary:     Effort: Pulmonary effort is normal.     Breath sounds: Normal breath sounds.  Abdominal:     General: Bowel sounds are normal.     Palpations: Abdomen is soft.  Musculoskeletal:     Cervical back: Normal range of motion.  Lymphadenopathy:     Cervical: No cervical adenopathy.  Skin:    General: Skin is warm and dry.     Capillary Refill: Capillary refill takes less than 2 seconds.  Neurological:     Mental Status: She is alert and oriented to person, place, and time.   Psychiatric:        Behavior: Behavior normal.     Musculoskeletal Exam: C-spine, thoracic spine, lumbar spine have good range of motion.  Shoulder joints have good range of motion with no discomfort.  Tenderness over the lateral epicondyle of the right elbow.  No tenderness along the right elbow joint line.  Wrist joints, MCPs, PIPs, DIPs have good range of motion with no synovitis.  Complete fist formation bilaterally.  Hip joints have good range of motion with no groin pain.  Knee joints have good range of motion with no warmth or effusion.  Ankle joints have good range of motion with no tenderness or joint swelling.  CDAI Exam: CDAI Score: -- Patient Global: --; Provider Global: -- Swollen: --; Tender: -- Joint Exam 04/30/2023   No joint exam has been documented for this visit   There is currently no information documented on the homunculus. Go to  the Rheumatology activity and complete the homunculus joint exam.  Investigation: No additional findings.  Imaging: No results found.  Recent Labs: Lab Results  Component Value Date   WBC 3.5 (L) 04/28/2023   HGB 12.9 04/28/2023   PLT 282 04/28/2023   NA 139 11/27/2022   K 4.7 11/27/2022   CL 105 11/27/2022   CO2 25 11/27/2022   GLUCOSE 107 (H) 11/27/2022   BUN 11 11/27/2022   CREATININE 0.71 11/27/2022   BILITOT 0.4 11/27/2022   ALKPHOS 62 08/09/2020   AST 21 11/27/2022   ALT 16 11/27/2022   PROT 7.6 11/27/2022   ALBUMIN 4.4 08/09/2020   CALCIUM 9.3 11/27/2022   GFRAA 125 07/11/2020    Speciality Comments: PLQ Eye Exam:08/27/2022 WNL @ [facility] Follow up in 1 year  Procedures:  Hand/UE Inj: R elbow for lateral epicondylitis on 04/30/2023 2:48 PM Indications: pain Details: 27 G needle, lateral approach Medications: 1 mL lidocaine 1 %; 30 mg triamcinolone acetonide 40 MG/ML Aspirate: 0 mL Outcome: tolerated well, no immediate complications Procedure, treatment alternatives, risks and benefits  explained, specific risks discussed. Consent was given by the patient. Immediately prior to procedure a time out was called to verify the correct patient, procedure, equipment, support staff and site/side marked as required. Patient was prepped and draped in the usual sterile fashion.    Allergies: Patient has no known allergies.     PLQ Eye Exam:08/27/2022 WNL @ [facility] Follow up in 1 year   Assessment / Plan:     Visit Diagnoses: Sjogren's syndrome with other organ involvement (HCC) - - ANA 1:1280, Ro>8, RF negative, sicca sx, arthralgias, neutropenia, hypocomplementemia: Patient continues to have chronic sicca symptoms.  She has been using over-the-counter products for symptomatic relief.  She has noticed some increased eye pain and photosensitivity over the past 2 weeks.  No conjunctival injection noted today.  She has an upcoming appointment with ophthalmology in November 2024.  She was advised to notify her ophthalmologist if her symptoms progress or worsen.  She plans on continuing to use daily eyedrops which provide temporary relief.   She remains on Plaquenil 200 mg 1 tablet by mouth daily.  She is tolerating Plaquenil without any side effects and has not missed any doses recently.  She has no synovitis on examination today.  No cervical lymphadenopathy.   11/27/22: Ro+, dsDNA negative, complements WNL, ESR WNL, and UA WNL. UA updated on 04/28/23.  The following lab work will be updated today.  She will notify us if she develops any new or worsening symptoms.  She will follow-up in the office in 5 months or sooner if needed.  High risk medication use - Plaquenil 200 mg 1 tablet by mouth daily . PLQ Eye Exam:08/27/2022.  CBC updated on 04/28/2023: White blood cell count 3.5, absolute neutrophils were within normal limits.  CMP will be updated today.- Plan: COMPLETE METABOLIC PANEL WITH GFR, Anti-DNA antibody, double-stranded, C3 and C4, Sedimentation rate  History of  neutropenia: White blood cell count was 3.5 and absolute neutrophils were within normal limits on 04/28/2023.  Lateral epicondylitis, right elbow: Patient presents today with pain in the right elbow.  Her symptoms started 6 to 7 months ago with no identifiable trigger.  She has not been performing any overuse or repetitive activities.  The pain is radiating down her right forearm.  She has not had any numbness. On examination she has full flexion extension of the elbow.  Tenderness over the right  lateral epicondyle.  No tenderness along the joint line or olecranon bursitis. Discussed the diagnosis of right lateral epicondylitis.  Different treatment options were discussed including home exercises, physical therapy, and a cortisone injection.  Due to chronicity of pain she has requested to have a cortisone injection performed.  She tolerated procedure well.  Procedure note was completed above.  Aftercare was discussed.  She was advised to notify us if her symptoms persist or worsen.  She was given a handout of exercises to perform once her symptoms have improved.  Primary osteoarthritis of both knees: She has good range of motion of both knee joints on examination today.  She has not been discomfort in the left knee joint.  No warmth or effusion noted.  Primary osteoarthritis of both feet: She is not experiencing increased pain in her feet at this time.  Chronic SI joint pain: No SI joint tenderness upon palpation today.  Cervicalgia: C-spine has good range of motion with no discomfort at this time.  Other fatigue: Chronic.  Patient has noticed increased fatigue which she attributes to interrupted sleep at night.  Language barrier: Interpreter present throughout the entirety of the office visit.  Orders: Orders Placed This Encounter  Procedures  . Hand/UE Inj: R elbow  . COMPLETE METABOLIC PANEL WITH GFR  . Anti-DNA antibody, double-stranded  . C3 and C4  . Sedimentation rate   Meds ordered  this encounter  Medications  . hydroxychloroquine (PLAQUENIL) 200 MG tablet    Sig: Take 1 tablet (200 mg total) by mouth daily.    Dispense:  90 tablet    Refill:  0     Follow-Up Instructions: Return in about 5 months (around 09/28/2023) for Sjogren's syndrome, Osteoarthritis.   Gearldine Bienenstock, PA-C  Note - This record has been created using Dragon software.  Chart creation errors have been sought, but may not always  have been located. Such creation errors do not reflect on  the standard of medical care.

## 2023-04-28 ENCOUNTER — Encounter: Payer: Self-pay | Admitting: Obstetrics and Gynecology

## 2023-04-28 ENCOUNTER — Ambulatory Visit (INDEPENDENT_AMBULATORY_CARE_PROVIDER_SITE_OTHER): Payer: Commercial Managed Care - PPO | Admitting: Obstetrics and Gynecology

## 2023-04-28 VITALS — BP 114/76 | Ht 62.0 in | Wt 155.0 lb

## 2023-04-28 DIAGNOSIS — N951 Menopausal and female climacteric states: Secondary | ICD-10-CM | POA: Diagnosis not present

## 2023-04-28 DIAGNOSIS — N939 Abnormal uterine and vaginal bleeding, unspecified: Secondary | ICD-10-CM | POA: Insufficient documentation

## 2023-04-28 DIAGNOSIS — R232 Flushing: Secondary | ICD-10-CM | POA: Diagnosis not present

## 2023-04-28 LAB — URINALYSIS, COMPLETE W/RFL CULTURE
Bacteria, UA: NONE SEEN /[HPF]
Bilirubin Urine: NEGATIVE
Glucose, UA: NEGATIVE
Hgb urine dipstick: NEGATIVE
Hyaline Cast: NONE SEEN /[LPF]
Ketones, ur: NEGATIVE
Leukocyte Esterase: NEGATIVE
Nitrites, Initial: NEGATIVE
Protein, ur: NEGATIVE
RBC / HPF: NONE SEEN /[HPF] (ref 0–2)
Specific Gravity, Urine: 1.01 (ref 1.001–1.035)
WBC, UA: NONE SEEN /[HPF] (ref 0–5)
pH: 7 (ref 5.0–8.0)

## 2023-04-28 LAB — NO CULTURE INDICATED

## 2023-04-28 NOTE — Progress Notes (Signed)
47 y.o. [id number] female with Sjogren's, OA, hx of CIN 2(2016) here for referral for AUB and perimenopausal symptoms. [name] #[id number] and [name] was used for Spanish interpretation.  Patient's last menstrual period was 04/19/2023 (exact date). Period Pattern: (!) Irregular Menstrual Flow: Light, Moderate, Heavy Menstrual Control: Maxi pad Dysmenorrhea: (!) Severe Dysmenorrhea Symptoms: Cramping  Complains of heavy to light, irregular periods x's 6 months. Also noting mood changes and low libido over the last year.  Prescribed loestrin by PCP, however patient discontinued as hot flashes had not improved.Also noted weight changes and increased anxiety.  Birth control: none Last mammogram: 2021 BIRADS 1 PAP UTD Sexually active: In relationship with female partner x 6 years.   GYN HISTORY: CIN 2 s/p [id number] Prior CD  OB History  Gravida Para Term Preterm AB Living  6 3 3   2 3   SAB IAB Ectopic Multiple Live Births  1 1          # Outcome Date GA Lbr Len/2nd Weight Sex Type Anes PTL Lv  6 Term 11/22/06     CS-Unspec     5 Term 01/14/03     Vag-Spont     4 Term 06/25/98     Vag-Spont     3 Gravida           2 IAB           1 SAB             Past Medical History:  Diagnosis Date   Abnormal uterine bleeding    Epigastric pain 10/18/2021   History of chicken pox    Pain in joint of left shoulder 06/25/2022   Sjogren's disease (HCC)    Urinary tract infection 12/2014   Vaginal Pap smear, abnormal    Vertigo 10/18/2021    Past Surgical History:  Procedure Laterality Date   CESAREAN SECTION     LEEP      Current Outpatient Medications on File Prior to Visit  Medication Sig Dispense Refill   hydroxychloroquine (PLAQUENIL) 200 MG tablet TAKE 1 TABLET BY MOUTH EVERY DAY 30 tablet 2   meloxicam (MOBIC) 7.5 MG tablet TAKE 1-2 TABLETS (7.5-15 MG TOTAL) BY MOUTH DAILY. OR TAKE AS NEEDED FOR PAIN. 45 tablet 2   Multiple Vitamin (MULTIVITAMIN) tablet Take 1 tablet by mouth  daily.     vitamin C (ASCORBIC ACID) 500 MG tablet Take 500 mg by mouth daily.     No current facility-administered medications on file prior to visit.    No Known Allergies    PE Today's Vitals   04/28/23 1052  BP: 114/76  Weight: 155 lb (70.3 kg)  Height: 5\' 2"  (1.575 m)   Body mass index is 28.35 kg/m.  Physical Exam Vitals reviewed. Exam conducted with a chaperone present.  Constitutional:      General: She is not in acute distress.    Appearance: Normal appearance.  HENT:     Head: Normocephalic and atraumatic.     Nose: Nose normal.  Eyes:     Extraocular Movements: Extraocular movements intact.     Conjunctiva/sclera: Conjunctivae normal.  Pulmonary:     Effort: Pulmonary effort is normal.  Genitourinary:    General: Normal vulva.     Exam position: Lithotomy position.     Vagina: Normal. No vaginal discharge.     Cervix: Normal. No cervical motion tenderness, discharge or lesion.     Uterus: Normal. Not enlarged and not tender.  Adnexa: Right adnexa normal and left adnexa normal.  Musculoskeletal:        General: Normal range of motion.     Cervical back: Normal range of motion.  Neurological:     General: No focal deficit present.     Mental Status: She is alert.  Psychiatric:        Mood and Affect: Mood normal.        Behavior: Behavior normal.      Assessment and Plan:        Abnormal uterine bleeding (AUB) Assessment & Plan: Needs complete AUB w/u prior to initiation of HRT Given concerns around weight gain and mood changes and no need for contraception, consider HRT instead of OCP.  Orders: -     Thyroid Panel With TSH -     FSH/LH -     Prolactin -     US PELVIS TRANSVAGINAL NON-OB (TV ONLY); Future -     hCG, quantitative, pregnancy -     Endometrial biopsy; Future  Perimenopausal symptoms Assessment & Plan: Patient with irregular cycles, hot flashes, mood changes, and low libido Hx of situational depression with prior sjogren's  but symptoms resolved with appropriate medical diagnosis. She had not needed medication since then. Will plan for HRT evaluation once AUB w/u is complete.   Hot flashes Assessment & Plan: Reviewed can try black cohosh until completed work-up  Orders: -     Thyroid Panel With TSH -     HIV Antibody (routine testing w rflx) -     Urinalysis,Complete w/RFL Culture -     CBC with Differential/Platelet    Rosalyn Gess, MD

## 2023-04-28 NOTE — Assessment & Plan Note (Signed)
Reviewed can try black cohosh until completed work-up

## 2023-04-28 NOTE — Assessment & Plan Note (Signed)
Patient with irregular cycles, hot flashes, mood changes, and low libido Hx of situational depression with prior sjogren's but symptoms resolved with appropriate medical diagnosis. She had not needed medication since then. Will plan for HRT evaluation once AUB w/u is complete.

## 2023-04-28 NOTE — Assessment & Plan Note (Signed)
Needs complete AUB w/u prior to initiation of HRT Given concerns around weight gain and mood changes and no need for contraception, consider HRT instead of OCP.

## 2023-04-29 LAB — THYROID PANEL WITH TSH
Free Thyroxine Index: 2.3 (ref 1.4–3.8)
T3 Uptake: 35 % (ref 22–35)
T4, Total: 6.6 ug/dL (ref 5.1–11.9)
TSH: 3.02 m[IU]/L

## 2023-04-29 LAB — CBC WITH DIFFERENTIAL/PLATELET
Absolute Lymphocytes: 1131 {cells}/uL (ref 850–3900)
Absolute Monocytes: 466 {cells}/uL (ref 200–950)
Basophils Absolute: 60 {cells}/uL (ref 0–200)
Basophils Relative: 1.7 %
Eosinophils Absolute: 49 {cells}/uL (ref 15–500)
Eosinophils Relative: 1.4 %
HCT: 39.3 % (ref 35.0–45.0)
Hemoglobin: 12.9 g/dL (ref 11.7–15.5)
MCH: 31.5 pg (ref 27.0–33.0)
MCHC: 32.8 g/dL (ref 32.0–36.0)
MCV: 96.1 fL (ref 80.0–100.0)
MPV: 9 fL (ref 7.5–12.5)
Monocytes Relative: 13.3 %
Neutro Abs: 1796 {cells}/uL (ref 1500–7800)
Neutrophils Relative %: 51.3 %
Platelets: 282 10*3/uL (ref 140–400)
RBC: 4.09 10*6/uL (ref 3.80–5.10)
RDW: 12.3 % (ref 11.0–15.0)
Total Lymphocyte: 32.3 %
WBC: 3.5 10*3/uL — ABNORMAL LOW (ref 3.8–10.8)

## 2023-04-29 LAB — FSH/LH
FSH: 8.8 m[IU]/mL
LH: 5.3 m[IU]/mL

## 2023-04-29 LAB — HCG, QUANTITATIVE, PREGNANCY: HCG, Total, QN: <5 m[IU]/mL

## 2023-04-29 LAB — PROLACTIN: Prolactin: 8.8 ng/mL

## 2023-04-29 LAB — HIV ANTIBODY (ROUTINE TESTING W REFLEX): HIV 1&2 Ab, 4th Generation: NONREACTIVE

## 2023-04-30 ENCOUNTER — Ambulatory Visit: Payer: Commercial Managed Care - PPO | Attending: Physician Assistant | Admitting: Physician Assistant

## 2023-04-30 ENCOUNTER — Encounter: Payer: Self-pay | Admitting: Physician Assistant

## 2023-04-30 VITALS — BP 127/88 | HR 90 | Resp 15 | Ht 61.0 in | Wt 158.0 lb

## 2023-04-30 DIAGNOSIS — M170 Bilateral primary osteoarthritis of knee: Secondary | ICD-10-CM

## 2023-04-30 DIAGNOSIS — M19072 Primary osteoarthritis, left ankle and foot: Secondary | ICD-10-CM

## 2023-04-30 DIAGNOSIS — M19071 Primary osteoarthritis, right ankle and foot: Secondary | ICD-10-CM

## 2023-04-30 DIAGNOSIS — M533 Sacrococcygeal disorders, not elsewhere classified: Secondary | ICD-10-CM

## 2023-04-30 DIAGNOSIS — Z862 Personal history of diseases of the blood and blood-forming organs and certain disorders involving the immune mechanism: Secondary | ICD-10-CM

## 2023-04-30 DIAGNOSIS — G8929 Other chronic pain: Secondary | ICD-10-CM

## 2023-04-30 DIAGNOSIS — M3509 Sicca syndrome with other organ involvement: Secondary | ICD-10-CM

## 2023-04-30 DIAGNOSIS — Z758 Other problems related to medical facilities and other health care: Secondary | ICD-10-CM

## 2023-04-30 DIAGNOSIS — R5383 Other fatigue: Secondary | ICD-10-CM

## 2023-04-30 DIAGNOSIS — M542 Cervicalgia: Secondary | ICD-10-CM

## 2023-04-30 DIAGNOSIS — Z603 Acculturation difficulty: Secondary | ICD-10-CM

## 2023-04-30 DIAGNOSIS — M7711 Lateral epicondylitis, right elbow: Secondary | ICD-10-CM

## 2023-04-30 DIAGNOSIS — Z79899 Other long term (current) drug therapy: Secondary | ICD-10-CM

## 2023-04-30 MED ORDER — LIDOCAINE HCL 1 % IJ SOLN
1.0000 mL | INTRAMUSCULAR | Status: AC | PRN
  Administered 2023-04-30: 1 mL

## 2023-04-30 MED ORDER — TRIAMCINOLONE ACETONIDE 40 MG/ML IJ SUSP
30.0000 mg | INTRAMUSCULAR | Status: AC | PRN
  Administered 2023-04-30: 30 mg

## 2023-04-30 MED ORDER — HYDROXYCHLOROQUINE SULFATE 200 MG PO TABS: 200.0000 mg | ORAL_TABLET | Freq: Every day | ORAL | 0 refills | Status: DC

## 2023-04-30 NOTE — Patient Instructions (Signed)
Rehabilitacin del codo de [place] [facility] Pregunte al mdico qu ejercicios son seguros para usted. Haga los ejercicios exactamente como se lo haya indicado el mdico y gradelos como se lo hayan indicado. Es normal sentir un estiramiento leve, tironeo, opresin o Dentist al [employer] ejercicios. Detngase de inmediato si siente un dolor repentino o Community education officer. No comience a hacer estos ejercicios hasta que se lo indique el mdico. Ejercicios de elongacin y amplitud de movimiento Estos ejercicios calientan los msculos y las articulaciones, y mejoran la movilidad y la flexibilidad del codo. Flexin de la [place], asistida  Extienda el codo izquierdo/derecho hacia adelante, con la palma hacia abajo en direccin al suelo. Si el mdico se lo ha indicado, flexione el codo izquierdo/derecho en un ngulo de 90 grados (ngulo recto) al costado del cuerpo en lugar de sostenerlo extendido. Con la otra mano, empuje suavemente la parte posterior de la mano izquierda/derecha de modo que los dedos apunten hacia el suelo (flexin). Detngase al sentir un estiramiento suave en la parte posterior del antebrazo. Mantenga esta posicin durante ___________ segundos. Repita __________ veces. Realice este ejercicio __________ veces al da. Extensin de la [place], asistida  Extienda el codo izquierdo/derecho hacia adelante, con la palma hacia arriba en direccin al techo. Si el mdico se lo ha indicado, flexione el codo izquierdo/derecho en un ngulo de 90 grados (ngulo recto) al costado del cuerpo en lugar de sostenerlo extendido. Con la otra mano, lleve suavemente la mano izquierda/derecha, y los dedos, hacia abajo en direccin al suelo (extensin). Detngase al sentir un estiramiento suave en el lado del antebrazo correspondiente a la palma. Mantenga esta posicin durante ___________ segundos. Repita __________ veces. Realice este ejercicio __________ veces al da. Rotacin asistida del  antebrazo, supinacin Sintese o pngase de pie con los codos a los costados del cuerpo. Flexione el codo izquierdo/derecho en un ngulo de 90 grados (ngulo recto). Con la mano no lesionada, gire la palma izquierda/derecha hacia arriba en direccin al techo (supinacin) hasta sentir un estiramiento suave en la parte interna del antebrazo. Mantenga esta posicin durante ___________ segundos. Repita __________ veces. Realice este ejercicio __________ veces al da. Rotacin asistida del antebrazo, pronacin Sintese o pngase de pie con los codos a los costados del cuerpo. Flexione el codo izquierdo/derecho en un ngulo de 90 grados (ngulo recto). Con la mano no lesionada, gire la palma izquierda/derecha hacia abajo en direccin al suelo (pronacin) hasta sentir un estiramiento suave en la parte externa del antebrazo. Mantenga esta posicin durante __________ segundos. Repita __________ veces. Realice este ejercicio __________ veces al da. Ejercicios de fortalecimiento Estos ejercicios fortalecen el antebrazo y el codo, y les otorgan resistencia. La resistencia es la capacidad de usar los msculos durante un tiempo prolongado, incluso despus de que se cansen. Desviacin radial  Prese con un martillo o una pesa de __________ en la mano izquierda/derecha. O bien, sintese sosteniendo un tubo o una banda de goma para ejercicios con Glass blower/designer izquierdo/derecho apoyado sobre una mesa o encimera. Coloque el antebrazo de modo que el pulgar quede mirando hacia el techo, como si fuera a aplaudir. Esta es la posicin neutra. Eleve la mano hacia arriba frente a usted de modo que el pulgar apunte al techo (desviacin radial) o tire hacia arriba de la banda de goma. Mantenga el antebrazo y el codo quietos mientras mueve la mueca solamente. Mantenga esta posicin durante __________ segundos. Vuelva lentamente a la posicin inicial. Repita __________ veces. Realice este ejercicio __________ veces al  da. Extensin de la [place], excntrica Sintese con la palma y el antebrazo izquierdo/derecho hacia abajo, apoyados en una mesa u otra superficie. Deje que la mueca izquierda/derecha se extienda sobre el borde de la superficie. Sostenga una pesa de __________ o un tubo o una banda de goma para ejercicios en la mano izquierda/derecha. Si utiliza un tubo o una banda de goma para ejercicios, sostenga el otro extremo del tubo con la otra mano. Use la mano no lesionada para subir la mano izquierda/derecha hacia el techo. Retire la mano no lesionada y vuelva lentamente a la posicin inicial solo la mano izquierda/derecha. El movimiento de bajar el brazo bajo tensin se denomina extensin excntrica. Repita __________ veces. Realice este ejercicio __________ veces al da. Extensin de Warden/ranger No haga este ejercicio si le causa dolor en el lado externo del codo. Solo haga este ejercicio una vez que el mdico se lo haya indicado. Sintese con el antebrazo izquierdo/derecho apoyado sobre una mesa u otra superficie, y la palma hacia abajo en direccin al suelo. Deje que la mueca izquierda/derecha se extienda sobre el borde de la superficie. Sostenga una pesa de __________ o un tubo o una banda de goma para ejercicios. Si est usando un tubo o una banda de goma para ejercicios, sostenga la banda o el tubo en el lugar con la otra mano, para aumentar la resistencia. Flexione lentamente la [employer] de modo que la mano apunte hacia arriba en direccin al techo (extensin). Mueva solo la [place], manteniendo el antebrazo y el codo inmviles. Mantenga esta posicin durante __________ segundos. Vuelva lentamente a la posicin inicial. Repita __________ veces. Realice este ejercicio __________ veces al da. Rotacin del Product manager, supinacin Para Tree surgeon, necesitar un martillo liviano o un martillo de goma. Sintese con el antebrazo izquierdo/derecho apoyado en una mesa u otra superficie. Flexione  el codo en un ngulo de 90 grados (ngulo recto). Coloque el antebrazo de modo que la palma quede hacia abajo en direccin al suelo, con la mano descansando sobre el borde de la mesa. Sostenga un martillo en la mano izquierda/derecha. Para que este ejercicio sea ms fcil, sostenga el martillo cerca de la cabeza del [place]. Para que este ejercicio sea ms difcil, sostenga el martillo cerca del extremo del [place]. Sin mover la [employer] ni el codo, rote lentamente el antebrazo de modo que la palma quede [place] arriba, en direccin al techo (supinacin). Mantenga esta posicin durante __________ segundos. Vuelva lentamente a la posicin inicial. Repita __________ veces. Realice este ejercicio __________ veces al da. Compresin del omplato Sintese en una silla estable o pngase de pie y [place]. Si est sentado, no deje que la espalda toque el respaldo de la silla. Los brazos deben estar a los costados del cuerpo, con los codos flexionados en un ngulo de 90 grados (ngulo recto). Coloque los antebrazos de [employer] pulgares queden hacia el techo (posicin [place]). Sin levantar los hombros, Technical sales engineer los omplatos firmemente como si los [place]. Mantenga esta posicin durante ___________ segundos. Afloje y vuelva lentamente a la posicin inicial. Repita __________ veces. Realice este ejercicio __________ veces al da. Esta informacin no tiene Theme park manager el consejo del mdico. Asegrese de hacerle al mdico cualquier pregunta que tenga. Document Revised: 10/07/2019 Document Reviewed: 10/07/2019 Elsevier Patient Education  2024 [employer].

## 2023-05-01 LAB — COMPLETE METABOLIC PANEL WITH GFR
AG Ratio: 1.6 (calc) (ref 1.0–2.5)
ALT: 11 U/L (ref 6–29)
AST: 18 U/L (ref 10–30)
Albumin: 4.5 g/dL (ref 3.6–5.1)
Alkaline phosphatase (APISO): 64 U/L (ref 31–125)
BUN: 12 mg/dL (ref 7–25)
CO2: 28 mmol/L (ref 20–32)
Calcium: 9.6 mg/dL (ref 8.6–10.2)
Chloride: 102 mmol/L (ref 98–110)
Creat: 0.82 mg/dL (ref 0.50–0.99)
Globulin: 2.8 g/dL (ref 1.9–3.7)
Glucose, Bld: 98 mg/dL (ref 65–99)
Potassium: 4.5 mmol/L (ref 3.5–5.3)
Sodium: 138 mmol/L (ref 135–146)
Total Bilirubin: 0.7 mg/dL (ref 0.2–1.2)
Total Protein: 7.3 g/dL (ref 6.1–8.1)
eGFR: 90 mL/min/{1.73_m2} (ref >=60)

## 2023-05-01 LAB — ANTI-DNA ANTIBODY, DOUBLE-STRANDED: ds DNA Ab: 1 [IU]/mL

## 2023-05-01 LAB — C3 AND C4
C3 Complement: 97 mg/dL (ref 83–193)
C4 Complement: 19 mg/dL (ref 15–57)

## 2023-05-01 LAB — SEDIMENTATION RATE: Sed Rate: 6 mm/h (ref 0–20)

## 2023-05-01 NOTE — Progress Notes (Signed)
Complements WNL

## 2023-05-01 NOTE — Progress Notes (Signed)
CMP WNL.  ESR WNL

## 2023-05-04 NOTE — Progress Notes (Signed)
dsDNA is negative.  Labs are not consistent with a flare.

## 2023-05-07 ENCOUNTER — Ambulatory Visit (INDEPENDENT_AMBULATORY_CARE_PROVIDER_SITE_OTHER): Payer: Commercial Managed Care - PPO

## 2023-05-07 VITALS — BP 122/70 | HR 61 | Ht 61.0 in | Wt 153.0 lb

## 2023-05-07 DIAGNOSIS — D251 Intramural leiomyoma of uterus: Secondary | ICD-10-CM | POA: Insufficient documentation

## 2023-05-07 DIAGNOSIS — N939 Abnormal uterine and vaginal bleeding, unspecified: Secondary | ICD-10-CM

## 2023-05-07 DIAGNOSIS — R232 Flushing: Secondary | ICD-10-CM | POA: Diagnosis not present

## 2023-05-07 NOTE — Assessment & Plan Note (Addendum)
TVUS with 3cm type 3 fibroid Normal labs Wants to RTO for EMB Declined hormonal therapy which is reasonable given normal Hgb Discussed NSAID use for HVB and importance for q3 month cycle To call with cycles <q3 months

## 2023-05-07 NOTE — Progress Notes (Unsigned)
47 y.o. [id number] female with Sjogren's, OA, hx of CIN 2 (2016), AUB, perimenopause, same sex relationship here for TVUS. Chauncy Passy interpreter, was used for [place] interpretation.  Patient's last menstrual period was 04/20/2023 (exact date).   At last appt, she noted: "Complains of heavy to light, irregular periods x's 6 months. Also noting mood changes and low libido over the last year.  Prescribed loestrin by PCP, however patient discontinued as hot flashes had not improved.Also noted weight changes and increased anxiety."  No complaints today. Started black cohosh with improvement in symptoms.  Birth control: none Last mammogram: 2021 BIRADS 1 PAP UTD Sexually active: In relationship with female partner x 6 years.   GYN HISTORY: CIN 2 s/p LEEP 2016 Prior CD  OB History  Gravida Para Term Preterm AB Living  6 3 3   2 3   SAB IAB Ectopic Multiple Live Births  1 1          # Outcome Date GA Lbr Len/2nd Weight Sex Type Anes PTL Lv  6 Term 11/22/06     CS-Unspec     5 Term 01/14/03     Vag-Spont     4 Term 06/25/98     Vag-Spont     3 Gravida           2 IAB           1 SAB             Past Medical History:  Diagnosis Date   Abnormal uterine bleeding    Epigastric pain 10/18/2021   History of chicken pox    Pain in joint of left shoulder 06/25/2022   Sjogren's disease (HCC)    Urinary tract infection 12/2014   Vaginal Pap smear, abnormal    Vertigo 10/18/2021    Past Surgical History:  Procedure Laterality Date   CESAREAN SECTION     LEEP      Current Outpatient Medications on File Prior to Visit  Medication Sig Dispense Refill   hydroxychloroquine (PLAQUENIL) 200 MG tablet Take 1 tablet (200 mg total) by mouth daily. 90 tablet 0   meloxicam (MOBIC) 7.5 MG tablet TAKE 1-2 TABLETS (7.5-15 MG TOTAL) BY MOUTH DAILY. OR TAKE AS NEEDED FOR PAIN. 45 tablet 2   Multiple Vitamin (MULTIVITAMIN) tablet Take 1 tablet by mouth daily.     vitamin C (ASCORBIC ACID) 500 MG  tablet Take 500 mg by mouth daily.     No current facility-administered medications on file prior to visit.    No Known Allergies    PE Today's Vitals   05/07/23 1155  BP: 122/70  Pulse: 61  SpO2: 100%  Weight: 153 lb (69.4 kg)  Height: 5\' 1"  (1.549 m)   Body mass index is 28.91 kg/m.  Physical Exam Vitals reviewed.  Constitutional:      General: She is not in acute distress.    Appearance: Normal appearance.  HENT:     Head: Normocephalic and atraumatic.     Nose: Nose normal.  Eyes:     Extraocular Movements: Extraocular movements intact.     Conjunctiva/sclera: Conjunctivae normal.  Pulmonary:     Effort: Pulmonary effort is normal.  Musculoskeletal:        General: Normal range of motion.     Cervical back: Normal range of motion.  Neurological:     General: No focal deficit present.     Mental Status: She is alert.  Psychiatric:  Mood and Affect: Mood normal.        Behavior: Behavior normal.      Imaging: TVUS Indications: Abnormal uterine bleeding  Findings:   Uterus: Anteverted uterus uterus measuring 7.3 x 4.3 x 4 cm with 3.4 cm intramural fibroid (type 3). Endometrial thickness: 9 mm Left ovary: 3.1 x 1.6 x 1.5 cm, normal-appearing Right ovary: 2.5 x 1.3 x 1.3 cm, normal-appearing No free fluid  Impression:  Single 3 cm fibroid noted on ultrasound.  Assessment and Plan:        Hot flashes Assessment & Plan: Normal labs, improved with black cohosh   Abnormal uterine bleeding (AUB) Assessment & Plan: TVUS with 3cm type 3 fibroid Normal labs Wants to RTO for EMB Declined hormonal therapy which is reasonable given normal Hgb Discussed NSAID use for HVB and importance for q3 month cycle To call with cycles <q3 months  Orders: -     US PELVIS TRANSVAGINAL NON-OB (TV ONLY)  Intramural leiomyoma of uterus Assessment & Plan: Reviewed ultrasound revealing:  3cm type 3 fibroid  Discussed pathology of fibroids and benign nature.  Reviewed that management of fibroids is dependent upon associated symptoms. Asymptomatic fibroids can be managed expectantly. However, fibroids can cause AUB and bulk symptoms, including dysmenorrhea, pelvic and lower back pain and pressure, dyspareunia, and constipation.  Reviewed hormonal and nonhormonal management. She has elected for expectant management.      Rosalyn Gess, MD

## 2023-05-07 NOTE — Assessment & Plan Note (Signed)
Normal labs, improved with black cohosh

## 2023-05-07 NOTE — Assessment & Plan Note (Signed)
Reviewed ultrasound revealing:  3cm type 3 fibroid  Discussed pathology of fibroids and benign nature. Reviewed that management of fibroids is dependent upon associated symptoms. Asymptomatic fibroids can be managed expectantly. However, fibroids can cause AUB and bulk symptoms, including dysmenorrhea, pelvic and lower back pain and pressure, dyspareunia, and constipation.  Reviewed hormonal and nonhormonal management. She has elected for expectant management.

## 2023-06-22 ENCOUNTER — Ambulatory Visit (INDEPENDENT_AMBULATORY_CARE_PROVIDER_SITE_OTHER): Payer: Commercial Managed Care - PPO

## 2023-06-22 ENCOUNTER — Ambulatory Visit
Admission: EM | Admit: 2023-06-22 | Discharge: 2023-06-22 | Disposition: A | Discharge status: Home / Self Care | Payer: Commercial Managed Care - PPO | Attending: Family Medicine | Admitting: Family Medicine

## 2023-06-22 DIAGNOSIS — M1711 Unilateral primary osteoarthritis, right knee: Secondary | ICD-10-CM | POA: Diagnosis not present

## 2023-06-22 DIAGNOSIS — M25561 Pain in right knee: Secondary | ICD-10-CM | POA: Diagnosis not present

## 2023-06-22 MED ORDER — PREDNISONE 20 MG PO TABS: ORAL_TABLET | ORAL | 0 refills | Status: DC

## 2023-06-22 NOTE — ED Triage Notes (Signed)
Pt c/o right knee pain x 1 month-denies injury-states she called rheumatologist and could not be seen-NAD-steady gait

## 2023-06-22 NOTE — ED Provider Notes (Signed)
[facility]  Note:  This document was prepared using Conservation officer, historic buildings and may include unintentional dictation errors.  MRN: [id number] DOB: 09/25/1975  Subjective:   [name] is a 47 y.o. female presenting for 1 month history of acute onset persistent worsening right knee pain.  Has history of arthritis in the same knee.  Works extensive hours, does a lot of standing and walking for long periods of time.  No fall, trauma.  No history of gout.  No current facility-administered medications for this encounter.  Current Outpatient Medications:    hydroxychloroquine (PLAQUENIL) 200 MG tablet, Take 1 tablet (200 mg total) by mouth daily., Disp: 90 tablet, Rfl: 0   meloxicam (MOBIC) 7.5 MG tablet, TAKE 1-2 TABLETS (7.5-15 MG TOTAL) BY MOUTH DAILY. OR TAKE AS NEEDED FOR PAIN., Disp: 45 tablet, Rfl: 2   Multiple Vitamin (MULTIVITAMIN) tablet, Take 1 tablet by mouth daily., Disp: , Rfl:    vitamin C (ASCORBIC ACID) 500 MG tablet, Take 500 mg by mouth daily., Disp: , Rfl:    No Known Allergies  Past Medical History:  Diagnosis Date   Abnormal uterine bleeding    Epigastric pain 10/18/2021   History of chicken pox    Pain in joint of left shoulder 06/25/2022   Sjogren's disease (HCC)    Urinary tract infection 12/2014   Vaginal Pap smear, abnormal    Vertigo 10/18/2021     Past Surgical History:  Procedure Laterality Date   CESAREAN SECTION     LEEP      Family History  Problem Relation Age of Onset   Diabetes Mother    Hypertension Mother    High Cholesterol Mother    Healthy Sister    Healthy Brother    Healthy Brother    Breast cancer Paternal Grandmother    Healthy Son    Healthy Son    Healthy Son     Social History   Tobacco Use   Smoking status: Some Days    Types: Cigarettes, Cigars    Passive exposure: Past   Smokeless tobacco: Never  Vaping Use   Vaping status: Never Used  Substance Use Topics    Alcohol use: Not Currently    Comment: occ   Drug use: No    ROS   Objective:   Vitals: BP 133/85 (BP Location: Right Arm)   Pulse 75   Temp 98.2 F (36.8 C) (Oral)   Resp 16   LMP 06/20/2023   SpO2 98%   Physical Exam Constitutional:      General: She is not in acute distress.    Appearance: Normal appearance. She is well-developed. She is not ill-appearing, toxic-appearing or diaphoretic.  HENT:     Head: Normocephalic and atraumatic.     Nose: Nose normal.     Mouth/Throat:     Mouth: Mucous membranes are moist.  Eyes:     General: No scleral icterus.       Right eye: No discharge.        Left eye: No discharge.     Extraocular Movements: Extraocular movements intact.  Cardiovascular:     Rate and Rhythm: Normal rate.  Pulmonary:     Effort: Pulmonary effort is normal.  Musculoskeletal:     Right knee: Swelling present. No deformity, effusion, erythema, ecchymosis, lacerations, bony tenderness or crepitus. Decreased range of motion. Tenderness present over the medial joint line and patellar tendon. No lateral joint line, MCL, LCL, ACL  or PCL tenderness. Normal alignment and normal patellar mobility.  Skin:    General: Skin is warm and dry.  Neurological:     General: No focal deficit present.     Mental Status: She is alert and oriented to person, place, and time.  Psychiatric:        Mood and Affect: Mood normal.        Behavior: Behavior normal.     I applied a 6 inch Ace wrap to the right knee.  Assessment and Plan :   PDMP not reviewed this encounter.  1. Acute pain of right knee   2. Osteoarthritis of right knee, unspecified osteoarthritis type     Recommended a oral prednisone course in the context of her osteoarthritis of the right knee, likely underlying inflammatory process secondary to overuse.  Counseled patient on potential for adverse effects with medications prescribed/recommended today, ER and return-to-clinic precautions discussed,  patient verbalized understanding.    Wallis Bamberg, [place] 06/22/23 1610

## 2023-08-16 ENCOUNTER — Other Ambulatory Visit: Payer: Self-pay | Admitting: Physician Assistant

## 2023-08-16 DIAGNOSIS — M3509 Sicca syndrome with other organ involvement: Secondary | ICD-10-CM

## 2023-08-17 NOTE — Telephone Encounter (Signed)
 Due to update Plaquenil  examination this month

## 2023-08-17 NOTE — Telephone Encounter (Signed)
 Last Fill: 04/30/2023  Eye exam: 08/27/2022 WNL    Labs: 04/30/2023 CMP WNL. 04/28/2023 WBC 3.5  Next Visit: 09/29/2023  Last Visit: 04/30/2023  [id number] syndrome with other organ involvement   Current Dose per office note 04/30/2023: Plaquenil  200 mg 1 tablet by mouth daily  Okay to refill Plaquenil ?

## 2023-08-17 NOTE — Telephone Encounter (Signed)
 Attempted to contact the patient and left message to advise patient she is due to update her PLQ eye exam this month.

## 2023-09-16 NOTE — Progress Notes (Unsigned)
 Office Visit Note  Patient: [name]             Date of Birth: 1975-08-23           MRN: [id number]             PCP: Dulce Sellar, NP Referring: Dulce Sellar, NP Visit Date: 09/29/2023 Occupation: @GUAROCC @  Subjective:    History of Present Illness: Jamesha Ellsworth is a 48 y.o. female with history of sjogren's syndrome and osteoarthritis.  Patient remains on  Plaquenil 200 mg 1 tablet by mouth daily   Lab work from 04/30/23: ESR WNL, complements WNL, dsDNA negative, CMP WNL.  The following lab work will be updated today.   PLQ Eye Exam:08/27/2022 WNL @ [facility] Follow up in 1 year  CMP 04/30/23.  Orders for CBC and CMP released today.    Activities of Daily Living:  Patient reports morning stiffness for *** {minute/hour:19697}.   Patient {ACTIONS;DENIES/REPORTS:21021675::"Denies"} nocturnal pain.  Difficulty dressing/grooming: {ACTIONS;DENIES/REPORTS:21021675::"Denies"} Difficulty climbing stairs: {ACTIONS;DENIES/REPORTS:21021675::"Denies"} Difficulty getting out of chair: {ACTIONS;DENIES/REPORTS:21021675::"Denies"} Difficulty using hands for taps, buttons, cutlery, and/or writing: {ACTIONS;DENIES/REPORTS:21021675::"Denies"}  No Rheumatology ROS completed.   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    Past Medical History:  Diagnosis Date   Abnormal uterine bleeding    Epigastric pain 10/18/2021   History of chicken pox    Pain in joint of left shoulder  06/25/2022   Sjogren's disease (HCC)    Urinary tract infection 12/2014   Vaginal Pap smear, abnormal    Vertigo 10/18/2021    Family History  Problem Relation Age of Onset   Diabetes Mother    Hypertension Mother    High Cholesterol Mother    Healthy Sister    Healthy Brother    Healthy Brother    Breast cancer Paternal Grandmother    Healthy Son    Healthy Son    Healthy Son    Past Surgical History:  Procedure Laterality Date   CESAREAN SECTION     LEEP     Social History   Social History Narrative   Not on file   Immunization History  Administered Date(s) Administered   Influenza, Seasonal, Injecte, Preservative Fre 04/01/2023   Influenza,inj,Quad PF,6+ Mos 06/24/2018, 04/08/2019, 07/25/2020, 05/17/2021, 06/13/2022   PFIZER(Purple Top)SARS-COV-2 Vaccination 07/31/2020, 08/21/2020   Pneumococcal Polysaccharide-23 03/25/2018   Tdap 11/23/2014     Objective: Vital Signs: There were no vitals taken for this visit.   Physical Exam Vitals and nursing note reviewed.  Constitutional:      Appearance: She is well-developed.  HENT:     Head: Normocephalic and atraumatic.  Eyes:     Conjunctiva/sclera: Conjunctivae normal.  Cardiovascular:     Rate and Rhythm: Normal rate and regular rhythm.     Heart sounds: Normal heart sounds.  Pulmonary:     Effort: Pulmonary effort is normal.     Breath sounds: Normal breath sounds.  Abdominal:     General: Bowel sounds are normal.     Palpations: Abdomen is soft.  Musculoskeletal:  Cervical back: Normal range of motion.  Lymphadenopathy:     Cervical: No cervical adenopathy.  Skin:    General: Skin is warm and dry.     Capillary Refill: Capillary refill takes less than 2 seconds.  Neurological:     Mental Status: She is alert and oriented to person, place, and time.  Psychiatric:        Behavior: Behavior normal.      Musculoskeletal Exam: ***  CDAI Exam: CDAI Score: -- Patient Global: --; Provider Global:  -- Swollen: --; Tender: -- Joint Exam 09/29/2023   No joint exam has been documented for this visit   There is currently no information documented on the homunculus. Go to the Rheumatology activity and complete the homunculus joint exam.  Investigation: No additional findings.  Imaging: No results found.  Recent Labs: Lab Results  Component Value Date   WBC 3.5 (L) 04/28/2023   HGB 12.9 04/28/2023   PLT 282 04/28/2023   NA 138 04/30/2023   K 4.5 04/30/2023   CL 102 04/30/2023   CO2 28 04/30/2023   GLUCOSE 98 04/30/2023   BUN 12 04/30/2023   CREATININE 0.82 04/30/2023   BILITOT 0.7 04/30/2023   ALKPHOS 62 08/09/2020   AST 18 04/30/2023   ALT 11 04/30/2023   PROT 7.3 04/30/2023   ALBUMIN 4.4 08/09/2020   CALCIUM 9.6 04/30/2023   GFRAA 125 07/11/2020    Speciality Comments: PLQ Eye Exam:08/27/2022 WNL @ [facility] Follow up in 1 year  Procedures:  No procedures performed Allergies: Patient has no known allergies.   Assessment / Plan:     Visit Diagnoses: Sjogren's syndrome with other organ involvement (HCC)  High risk medication use  History of neutropenia  Lateral epicondylitis, right elbow  Primary osteoarthritis of both knees  Primary osteoarthritis of both feet  Chronic SI joint pain  Cervicalgia  Other fatigue  Language barrier  Orders: No orders of the defined types were placed in this encounter.  No orders of the defined types were placed in this encounter.   Face-to-face time spent with patient was *** minutes. Greater than 50% of time was spent in counseling and coordination of care.  Follow-Up Instructions: No follow-ups on file.   Gearldine Bienenstock, PA-C  Note - This record has been created using Dragon software.  Chart creation errors have been sought, but may not always  have been located. Such creation errors do not reflect on  the standard of medical care.

## 2023-09-29 ENCOUNTER — Encounter: Payer: Self-pay | Admitting: Physician Assistant

## 2023-09-29 ENCOUNTER — Ambulatory Visit: Payer: Commercial Managed Care - PPO | Attending: Physician Assistant | Admitting: Physician Assistant

## 2023-09-29 ENCOUNTER — Encounter: Payer: Self-pay | Admitting: *Deleted

## 2023-09-29 VITALS — BP 108/75 | HR 86 | Resp 14 | Ht 61.0 in | Wt 161.4 lb

## 2023-09-29 DIAGNOSIS — G8929 Other chronic pain: Secondary | ICD-10-CM | POA: Diagnosis not present

## 2023-09-29 DIAGNOSIS — M7711 Lateral epicondylitis, right elbow: Secondary | ICD-10-CM

## 2023-09-29 DIAGNOSIS — Z758 Other problems related to medical facilities and other health care: Secondary | ICD-10-CM

## 2023-09-29 DIAGNOSIS — M19071 Primary osteoarthritis, right ankle and foot: Secondary | ICD-10-CM

## 2023-09-29 DIAGNOSIS — M25511 Pain in right shoulder: Secondary | ICD-10-CM

## 2023-09-29 DIAGNOSIS — Z862 Personal history of diseases of the blood and blood-forming organs and certain disorders involving the immune mechanism: Secondary | ICD-10-CM | POA: Diagnosis not present

## 2023-09-29 DIAGNOSIS — M25561 Pain in right knee: Secondary | ICD-10-CM

## 2023-09-29 DIAGNOSIS — M19072 Primary osteoarthritis, left ankle and foot: Secondary | ICD-10-CM

## 2023-09-29 DIAGNOSIS — M170 Bilateral primary osteoarthritis of knee: Secondary | ICD-10-CM

## 2023-09-29 DIAGNOSIS — M3509 Sicca syndrome with other organ involvement: Secondary | ICD-10-CM

## 2023-09-29 DIAGNOSIS — Z79899 Other long term (current) drug therapy: Secondary | ICD-10-CM | POA: Diagnosis not present

## 2023-09-29 DIAGNOSIS — Z603 Acculturation difficulty: Secondary | ICD-10-CM

## 2023-09-29 DIAGNOSIS — M533 Sacrococcygeal disorders, not elsewhere classified: Secondary | ICD-10-CM

## 2023-09-29 DIAGNOSIS — M542 Cervicalgia: Secondary | ICD-10-CM

## 2023-09-29 DIAGNOSIS — R5383 Other fatigue: Secondary | ICD-10-CM

## 2023-09-29 MED ORDER — TRIAMCINOLONE ACETONIDE 40 MG/ML IJ SUSP
40.0000 mg | INTRAMUSCULAR | Status: AC | PRN
  Administered 2023-09-29: 40 mg via INTRA_ARTICULAR

## 2023-09-29 MED ORDER — LIDOCAINE HCL 1 % IJ SOLN
1.5000 mL | INTRAMUSCULAR | Status: AC | PRN
  Administered 2023-09-29: 1.5 mL

## 2023-09-29 NOTE — Patient Instructions (Signed)

## 2023-10-01 LAB — CBC WITH DIFFERENTIAL/PLATELET
Absolute Lymphocytes: 1206 {cells}/uL (ref 850–3900)
Absolute Monocytes: 325 {cells}/uL (ref 200–950)
Basophils Absolute: 49 {cells}/uL (ref 0–200)
Basophils Relative: 1.9 %
Eosinophils Absolute: 49 {cells}/uL (ref 15–500)
Eosinophils Relative: 1.9 %
HCT: 39.3 % (ref 35.0–45.0)
Hemoglobin: 13.2 g/dL (ref 11.7–15.5)
MCH: 32.1 pg (ref 27.0–33.0)
MCHC: 33.6 g/dL (ref 32.0–36.0)
MCV: 95.6 fL (ref 80.0–100.0)
MPV: 9.8 fL (ref 7.5–12.5)
Monocytes Relative: 12.5 %
Neutro Abs: 970 {cells}/uL — ABNORMAL LOW (ref 1500–7800)
Neutrophils Relative %: 37.3 %
Platelets: 269 10*3/uL (ref 140–400)
RBC: 4.11 10*6/uL (ref 3.80–5.10)
RDW: 12.7 % (ref 11.0–15.0)
Total Lymphocyte: 46.4 %
WBC: 2.6 10*3/uL — ABNORMAL LOW (ref 3.8–10.8)

## 2023-10-01 LAB — COMPREHENSIVE METABOLIC PANEL WITH GFR
AG Ratio: 1.6 (calc) (ref 1.0–2.5)
ALT: 16 U/L (ref 6–29)
AST: 21 U/L (ref 10–35)
Albumin: 4.6 g/dL (ref 3.6–5.1)
Alkaline phosphatase (APISO): 66 U/L (ref 31–125)
BUN: 17 mg/dL (ref 7–25)
CO2: 32 mmol/L (ref 20–32)
Calcium: 9.6 mg/dL (ref 8.6–10.2)
Chloride: 103 mmol/L (ref 98–110)
Creat: 0.69 mg/dL (ref 0.50–0.99)
Globulin: 2.8 g/dL (ref 1.9–3.7)
Glucose, Bld: 103 mg/dL — ABNORMAL HIGH (ref 65–99)
Potassium: 3.9 mmol/L (ref 3.5–5.3)
Sodium: 140 mmol/L (ref 135–146)
Total Bilirubin: 0.5 mg/dL (ref 0.2–1.2)
Total Protein: 7.4 g/dL (ref 6.1–8.1)
eGFR: 109 mL/min/{1.73_m2} (ref >=60)

## 2023-10-01 LAB — PROTEIN ELECTROPHORESIS, SERUM, WITH REFLEX
Albumin ELP: 4.3 g/dL (ref 3.8–4.8)
Alpha 1: 0.2 g/dL (ref 0.2–0.3)
Alpha 2: 0.5 g/dL (ref 0.5–0.9)
Beta 2: 0.4 g/dL (ref 0.2–0.5)
Beta Globulin: 0.5 g/dL (ref 0.4–0.6)
Gamma Globulin: 1.3 g/dL (ref 0.8–1.7)
Total Protein: 7.2 g/dL (ref 6.1–8.1)

## 2023-10-01 LAB — C3 AND C4
C3 Complement: 95 mg/dL (ref 83–193)
C4 Complement: 16 mg/dL (ref 15–57)

## 2023-10-01 LAB — PROTEIN / CREATININE RATIO, URINE
Creatinine, Urine: 99 mg/dL (ref 20–275)
Protein/Creat Ratio: 91 mg/g{creat} (ref 24–184)
Protein/Creatinine Ratio: 0.091 mg/mg{creat} (ref 0.024–0.184)
Total Protein, Urine: 9 mg/dL (ref 5–24)

## 2023-10-01 LAB — SEDIMENTATION RATE: Sed Rate: 6 mm/h (ref 0–20)

## 2023-10-01 LAB — C-REACTIVE PROTEIN: CRP: <3 mg/L (ref <8.0)

## 2023-10-01 LAB — ANTI-DNA ANTIBODY, DOUBLE-STRANDED: ds DNA Ab: <1 [IU]/mL

## 2023-10-02 ENCOUNTER — Other Ambulatory Visit: Payer: Self-pay | Admitting: *Deleted

## 2023-10-02 DIAGNOSIS — Z79899 Other long term (current) drug therapy: Secondary | ICD-10-CM

## 2023-10-02 DIAGNOSIS — M3509 Sicca syndrome with other organ involvement: Secondary | ICD-10-CM

## 2023-10-02 NOTE — Progress Notes (Signed)
 ESR and CRP within normal limits.  Urine protein creatinine ratio within normal limits.  Complements within normal limits, double-stranded DNA negative, WBC count remains low-2.6 and absolute neutrophils are low-970.  Rest of CBC WNL.  CMP WNL. SPEP-no abnormal protein bands. Recheck CBC with diff in 1 month.

## 2023-11-16 ENCOUNTER — Other Ambulatory Visit: Payer: Self-pay | Admitting: *Deleted

## 2023-11-16 DIAGNOSIS — Z79899 Other long term (current) drug therapy: Secondary | ICD-10-CM

## 2023-11-16 DIAGNOSIS — M3509 Sicca syndrome with other organ involvement: Secondary | ICD-10-CM

## 2023-11-16 LAB — CBC WITH DIFFERENTIAL/PLATELET
Absolute Lymphocytes: 1082 {cells}/uL (ref 850–3900)
Absolute Monocytes: 395 {cells}/uL (ref 200–950)
Basophils Absolute: 60 {cells}/uL (ref 0–200)
Basophils Relative: 2.3 %
Eosinophils Absolute: 21 {cells}/uL (ref 15–500)
Eosinophils Relative: 0.8 %
HCT: 39.4 % (ref 35.0–45.0)
Hemoglobin: 13.1 g/dL (ref 11.7–15.5)
MCH: 31.8 pg (ref 27.0–33.0)
MCHC: 33.2 g/dL (ref 32.0–36.0)
MCV: 95.6 fL (ref 80.0–100.0)
MPV: 9.8 fL (ref 7.5–12.5)
Monocytes Relative: 15.2 %
Neutro Abs: 1043 {cells}/uL — ABNORMAL LOW (ref 1500–7800)
Neutrophils Relative %: 40.1 %
Platelets: 224 10*3/uL (ref 140–400)
RBC: 4.12 10*6/uL (ref 3.80–5.10)
RDW: 12.4 % (ref 11.0–15.0)
Total Lymphocyte: 41.6 %
WBC: 2.6 10*3/uL — ABNORMAL LOW (ref 3.8–10.8)

## 2023-11-17 ENCOUNTER — Ambulatory Visit: Payer: Self-pay | Admitting: Rheumatology

## 2023-11-17 NOTE — Progress Notes (Signed)
 White cell count is low at 2.6 which is stable.  Repeat CBC, CMP and autoimmune labs in 3 months.

## 2024-02-15 NOTE — Progress Notes (Deleted)
 Office Visit Note  Patient: [name]             Date of Birth: [birth year]           MRN: [id number]             PCP: Lucius Krabbe, NP Referring: Lucius Krabbe, NP Visit Date: 02/29/2024 Occupation: @GUAROCC @  Subjective:  No chief complaint on file.   History of Present Illness: [name] is a 48 y.o. female ***     Activities of Daily Living:  Patient reports morning stiffness for *** {minute/hour:19697}.   Patient {ACTIONS;DENIES/REPORTS:21021675::Denies} nocturnal pain.  Difficulty dressing/grooming: {ACTIONS;DENIES/REPORTS:21021675::Denies} Difficulty climbing stairs: {ACTIONS;DENIES/REPORTS:21021675::Denies} Difficulty getting out of chair: {ACTIONS;DENIES/REPORTS:21021675::Denies} Difficulty using hands for taps, buttons, cutlery, and/or writing: {ACTIONS;DENIES/REPORTS:21021675::Denies}  No Rheumatology ROS completed.   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    Past Medical History:  Diagnosis Date   Abnormal uterine bleeding    Epigastric pain 10/18/2021   History of chicken pox    Pain in joint of left shoulder 06/25/2022   Sjogren's disease (HCC)    Urinary tract infection 12/2014   Vaginal Pap smear, abnormal    Vertigo 10/18/2021    Family History  Problem Relation Age of Onset   Diabetes Mother    Hypertension Mother    High Cholesterol Mother    Healthy Sister    Healthy Brother    Healthy Brother    Breast cancer Paternal  Grandmother    Healthy Son    Healthy Son    Healthy Son    Past Surgical History:  Procedure Laterality Date   CESAREAN SECTION     LEEP     Social History   Social History Narrative   Not on file   Immunization History  Administered Date(s) Administered   Influenza, Seasonal, Injecte, Preservative Fre 04/01/2023   Influenza,inj,Quad PF,6+ Mos 06/24/2018, 04/08/2019, 07/25/2020, 05/17/2021, 06/13/2022   PFIZER(Purple Top)SARS-COV-2 Vaccination 07/31/2020, 08/21/2020   Pneumococcal Polysaccharide-23 03/25/2018   Tdap 11/23/2014     Objective: Vital Signs: There were no vitals taken for this visit.   Physical Exam   Musculoskeletal Exam: ***  CDAI Exam: CDAI Score: -- Patient Global: --; Provider Global: -- Swollen: --; Tender: -- Joint Exam 02/29/2024   No joint exam has been documented for this visit   There is currently no information documented on the homunculus. Go to the Rheumatology activity and complete the homunculus joint exam.  Investigation: No additional findings.  Imaging: No results found.  Recent Labs: Lab Results  Component Value Date   WBC 2.6 (L) 11/16/2023   HGB 13.1 11/16/2023   PLT 224 11/16/2023   NA 140 09/29/2023   K 3.9 09/29/2023   CL 103 09/29/2023   CO2 32 09/29/2023   GLUCOSE 103 (H) 09/29/2023   BUN 17 09/29/2023   CREATININE 0.69 09/29/2023   BILITOT 0.5 09/29/2023   ALKPHOS 62 08/09/2020   AST 21 09/29/2023   ALT 16 09/29/2023   PROT 7.2 09/29/2023   PROT 7.4 09/29/2023   ALBUMIN 4.4 08/09/2020  CALCIUM 9.6 09/29/2023   GFRAA 125 07/11/2020    Speciality Comments: PLQ Eye Exam: 09/17/2023 WNL @ [facility] Follow up in 1 year  Procedures:  No procedures performed Allergies: Patient has no known allergies.   Assessment / Plan:     Visit Diagnoses: Sjogren's syndrome with other organ involvement (HCC)  High risk medication use  History of neutropenia  Lateral epicondylitis, right  elbow  Primary osteoarthritis of both knees  Primary osteoarthritis of both feet  Chronic SI joint pain  Cervicalgia  Other fatigue  Language barrier  Orders: No orders of the defined types were placed in this encounter.  No orders of the defined types were placed in this encounter.   Face-to-face time spent with patient was *** minutes. Greater than 50% of time was spent in counseling and coordination of care.  Follow-Up Instructions: No follow-ups on file.   Waddell CHRISTELLA Craze, PA-C  Note - This record has been created using Dragon software.  Chart creation errors have been sought, but may not always  have been located. Such creation errors do not reflect on  the standard of medical care.

## 2024-02-29 ENCOUNTER — Ambulatory Visit: Admitting: Physician Assistant

## 2024-02-29 DIAGNOSIS — Z758 Other problems related to medical facilities and other health care: Secondary | ICD-10-CM

## 2024-02-29 DIAGNOSIS — Z79899 Other long term (current) drug therapy: Secondary | ICD-10-CM

## 2024-02-29 DIAGNOSIS — M170 Bilateral primary osteoarthritis of knee: Secondary | ICD-10-CM

## 2024-02-29 DIAGNOSIS — R5383 Other fatigue: Secondary | ICD-10-CM

## 2024-02-29 DIAGNOSIS — M7711 Lateral epicondylitis, right elbow: Secondary | ICD-10-CM

## 2024-02-29 DIAGNOSIS — M3509 Sicca syndrome with other organ involvement: Secondary | ICD-10-CM

## 2024-02-29 DIAGNOSIS — Z862 Personal history of diseases of the blood and blood-forming organs and certain disorders involving the immune mechanism: Secondary | ICD-10-CM

## 2024-02-29 DIAGNOSIS — M19071 Primary osteoarthritis, right ankle and foot: Secondary | ICD-10-CM

## 2024-02-29 DIAGNOSIS — M542 Cervicalgia: Secondary | ICD-10-CM

## 2024-02-29 DIAGNOSIS — G8929 Other chronic pain: Secondary | ICD-10-CM

## 2024-03-01 NOTE — Progress Notes (Unsigned)
 Office Visit Note  Patient: [name]             Date of Birth: 1975/07/09           MRN: [id number]             PCP: Lucius Krabbe, NP Referring: Lucius Krabbe, NP Visit Date: 03/15/2024 Occupation: @GUAROCC @  Subjective:    History of Present Illness: [name] is a 48 y.o. female with history of sjogren's syndrome and osteoarthritis. Patient remains on  Plaquenil  200 mg 1 tablet by mouth daily.   Lab work from 09/29/23 was reviewed today in the office: SPEP normal, dsDNA negative, complements WNL, ESR WNL, CRP WNL.  The following lab work will be updated today.    PLQ Eye Exam: 09/17/2023 WNL @ [facility] Follow up in 1 year  CMP updated on 09/29/23.  CBC updated on 11/16/23.   Activities of Daily Living:  Patient reports morning stiffness for *** {minute/hour:19697}.   Patient {ACTIONS;DENIES/REPORTS:21021675::Denies} nocturnal pain.  Difficulty dressing/grooming: {ACTIONS;DENIES/REPORTS:21021675::Denies} Difficulty climbing stairs: {ACTIONS;DENIES/REPORTS:21021675::Denies} Difficulty getting out of chair: {ACTIONS;DENIES/REPORTS:21021675::Denies} Difficulty using hands for taps, buttons, cutlery, and/or writing: {ACTIONS;DENIES/REPORTS:21021675::Denies}  No Rheumatology ROS completed.   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    Past Medical History:  Diagnosis Date   Abnormal uterine bleeding    Epigastric pain 10/18/2021   History of chicken pox     Pain in joint of left shoulder 06/25/2022   Sjogren's disease (HCC)    Urinary tract infection 12/2014   Vaginal Pap smear, abnormal    Vertigo 10/18/2021    Family History  Problem Relation Age of Onset   Diabetes Mother    Hypertension Mother    High Cholesterol Mother    Healthy Sister    Healthy Brother    Healthy Brother    Breast cancer Paternal Grandmother    Healthy Son    Healthy Son    Healthy Son    Past Surgical History:  Procedure Laterality Date   CESAREAN SECTION     LEEP     Social History   Social History Narrative   Not on file   Immunization History  Administered Date(s) Administered   Influenza, Seasonal, Injecte, Preservative Fre 04/01/2023   Influenza,inj,Quad PF,6+ Mos 06/24/2018, 04/08/2019, 07/25/2020, 05/17/2021, 06/13/2022   PFIZER(Purple Top)SARS-COV-2 Vaccination 07/31/2020, 08/21/2020   Pneumococcal Polysaccharide-23 03/25/2018   Tdap 11/23/2014     Objective: Vital Signs: There were no vitals taken for this visit.   Physical Exam Vitals and nursing note reviewed.  Constitutional:      Appearance: She is well-developed.  HENT:     Head: Normocephalic and atraumatic.  Eyes:     Conjunctiva/sclera: Conjunctivae normal.  Cardiovascular:     Rate and Rhythm: Normal rate and regular rhythm.     Heart sounds: Normal heart sounds.  Pulmonary:     Effort: Pulmonary effort is normal.     Breath sounds: Normal breath sounds.  Abdominal:     General: Bowel sounds are normal.     Palpations:  Abdomen is soft.  Musculoskeletal:     Cervical back: Normal range of motion.  Lymphadenopathy:     Cervical: No cervical adenopathy.  Skin:    General: Skin is warm and dry.     Capillary Refill: Capillary refill takes less than 2 seconds.  Neurological:     Mental Status: She is alert and oriented to person, place, and time.  Psychiatric:        Behavior: Behavior normal.      Musculoskeletal Exam: ***  CDAI Exam: CDAI Score:  -- Patient Global: --; Provider Global: -- Swollen: --; Tender: -- Joint Exam 03/15/2024   No joint exam has been documented for this visit   There is currently no information documented on the homunculus. Go to the Rheumatology activity and complete the homunculus joint exam.  Investigation: No additional findings.  Imaging: No results found.  Recent Labs: Lab Results  Component Value Date   WBC 2.6 (L) 11/16/2023   HGB 13.1 11/16/2023   PLT 224 11/16/2023   NA 140 09/29/2023   K 3.9 09/29/2023   CL 103 09/29/2023   CO2 32 09/29/2023   GLUCOSE 103 (H) 09/29/2023   BUN 17 09/29/2023   CREATININE 0.69 09/29/2023   BILITOT 0.5 09/29/2023   ALKPHOS 62 08/09/2020   AST 21 09/29/2023   ALT 16 09/29/2023   PROT 7.2 09/29/2023   PROT 7.4 09/29/2023   ALBUMIN 4.4 08/09/2020   CALCIUM 9.6 09/29/2023   GFRAA 125 07/11/2020    Speciality Comments: PLQ Eye Exam: 09/17/2023 WNL @ [facility] Follow up in 1 year  Procedures:  No procedures performed Allergies: Patient has no known allergies.   Assessment / Plan:     Visit Diagnoses: Sjogren's syndrome with other organ involvement (HCC)  High risk medication use  History of neutropenia  Lateral epicondylitis, right elbow  Primary osteoarthritis of both knees  Primary osteoarthritis of both feet  Chronic SI joint pain  Cervicalgia  Other fatigue  Language barrier  Orders: No orders of the defined types were placed in this encounter.  No orders of the defined types were placed in this encounter.   Face-to-face time spent with patient was *** minutes. Greater than 50% of time was spent in counseling and coordination of care.  Follow-Up Instructions: No follow-ups on file.   Waddell CHRISTELLA Craze, PA-C  Note - This record has been created using Dragon software.  Chart creation errors have been sought, but may not always  have been located. Such creation errors do not reflect on  the standard of  medical care.

## 2024-03-15 ENCOUNTER — Encounter: Payer: Self-pay | Admitting: Physician Assistant

## 2024-03-15 ENCOUNTER — Ambulatory Visit: Attending: Physician Assistant | Admitting: Physician Assistant

## 2024-03-15 VITALS — BP 127/85 | HR 83 | Ht 61.0 in | Wt 149.2 lb

## 2024-03-15 DIAGNOSIS — M170 Bilateral primary osteoarthritis of knee: Secondary | ICD-10-CM

## 2024-03-15 DIAGNOSIS — M533 Sacrococcygeal disorders, not elsewhere classified: Secondary | ICD-10-CM

## 2024-03-15 DIAGNOSIS — Z862 Personal history of diseases of the blood and blood-forming organs and certain disorders involving the immune mechanism: Secondary | ICD-10-CM

## 2024-03-15 DIAGNOSIS — M7711 Lateral epicondylitis, right elbow: Secondary | ICD-10-CM

## 2024-03-15 DIAGNOSIS — M3509 Sicca syndrome with other organ involvement: Secondary | ICD-10-CM

## 2024-03-15 DIAGNOSIS — Z79899 Other long term (current) drug therapy: Secondary | ICD-10-CM | POA: Diagnosis not present

## 2024-03-15 DIAGNOSIS — Z603 Acculturation difficulty: Secondary | ICD-10-CM

## 2024-03-15 DIAGNOSIS — Z758 Other problems related to medical facilities and other health care: Secondary | ICD-10-CM

## 2024-03-15 DIAGNOSIS — M19072 Primary osteoarthritis, left ankle and foot: Secondary | ICD-10-CM

## 2024-03-15 DIAGNOSIS — M19071 Primary osteoarthritis, right ankle and foot: Secondary | ICD-10-CM

## 2024-03-15 DIAGNOSIS — G8929 Other chronic pain: Secondary | ICD-10-CM

## 2024-03-15 DIAGNOSIS — M542 Cervicalgia: Secondary | ICD-10-CM

## 2024-03-15 DIAGNOSIS — R5383 Other fatigue: Secondary | ICD-10-CM

## 2024-03-15 MED ORDER — HYDROXYCHLOROQUINE SULFATE 200 MG PO TABS: 200.0000 mg | ORAL_TABLET | Freq: Every day | ORAL | 0 refills | Status: AC

## 2024-03-16 ENCOUNTER — Encounter: Payer: Self-pay | Admitting: Physician Assistant

## 2024-03-16 ENCOUNTER — Ambulatory Visit: Payer: Self-pay | Admitting: Physician Assistant

## 2024-03-16 NOTE — Progress Notes (Signed)
 WBC count remains low-2.2.  absolute neutrophils are low.  Rest of CBC WNL CMP WNL ESR WNL Urine protein creatinine ratio WNL

## 2024-03-16 NOTE — Progress Notes (Signed)
Complements within normal limits.

## 2024-03-17 LAB — CBC WITH DIFFERENTIAL/PLATELET
Absolute Lymphocytes: 950 {cells}/uL (ref 850–3900)
Absolute Monocytes: 356 {cells}/uL (ref 200–950)
Basophils Absolute: 59 {cells}/uL (ref 0–200)
Basophils Relative: 2.7 %
Eosinophils Absolute: 31 {cells}/uL (ref 15–500)
Eosinophils Relative: 1.4 %
HCT: 39.7 % (ref 35.0–45.0)
Hemoglobin: 13 g/dL (ref 11.7–15.5)
MCH: 31.6 pg (ref 27.0–33.0)
MCHC: 32.7 g/dL (ref 32.0–36.0)
MCV: 96.4 fL (ref 80.0–100.0)
MPV: 9 fL (ref 7.5–12.5)
Monocytes Relative: 16.2 %
Neutro Abs: 803 {cells}/uL — ABNORMAL LOW (ref 1500–7800)
Neutrophils Relative %: 36.5 %
Platelets: 283 Thousand/uL (ref 140–400)
RBC: 4.12 Million/uL (ref 3.80–5.10)
RDW: 12.5 % (ref 11.0–15.0)
Total Lymphocyte: 43.2 %
WBC: 2.2 Thousand/uL — ABNORMAL LOW (ref 3.8–10.8)

## 2024-03-17 LAB — ANTI-NUCLEAR AB-TITER (ANA TITER)
ANA TITER: 1:1280 {titer} — ABNORMAL HIGH
ANA Titer 1: 1:1280 {titer} — ABNORMAL HIGH

## 2024-03-17 LAB — COMPREHENSIVE METABOLIC PANEL WITH GFR
AG Ratio: 1.4 (calc) (ref 1.0–2.5)
ALT: 21 U/L (ref 6–29)
AST: 22 U/L (ref 10–35)
Albumin: 4.3 g/dL (ref 3.6–5.1)
Alkaline phosphatase (APISO): 61 U/L (ref 31–125)
BUN: 14 mg/dL (ref 7–25)
CO2: 28 mmol/L (ref 20–32)
Calcium: 9.3 mg/dL (ref 8.6–10.2)
Chloride: 105 mmol/L (ref 98–110)
Creat: 0.83 mg/dL (ref 0.50–0.99)
Globulin: 3 g/dL (ref 1.9–3.7)
Glucose, Bld: 101 mg/dL — ABNORMAL HIGH (ref 65–99)
Potassium: 4.1 mmol/L (ref 3.5–5.3)
Sodium: 139 mmol/L (ref 135–146)
Total Bilirubin: 0.7 mg/dL (ref 0.2–1.2)
Total Protein: 7.3 g/dL (ref 6.1–8.1)
eGFR: 89 mL/min/1.73m2 (ref >=60)

## 2024-03-17 LAB — PROTEIN / CREATININE RATIO, URINE
Creatinine, Urine: 121 mg/dL (ref 20–275)
Protein/Creat Ratio: 50 mg/g{creat} (ref 24–184)
Protein/Creatinine Ratio: 0.05 mg/mg{creat} (ref 0.024–0.184)
Total Protein, Urine: 6 mg/dL (ref 5–24)

## 2024-03-17 LAB — PROTEIN ELECTROPHORESIS, SERUM, WITH REFLEX
Albumin ELP: 4.4 g/dL (ref 3.8–4.8)
Alpha 1: 0.2 g/dL (ref 0.2–0.3)
Alpha 2: 0.5 g/dL (ref 0.5–0.9)
Beta 2: 0.4 g/dL (ref 0.2–0.5)
Beta Globulin: 0.5 g/dL (ref 0.4–0.6)
Gamma Globulin: 1.3 g/dL (ref 0.8–1.7)
Total Protein: 7.4 g/dL (ref 6.1–8.1)

## 2024-03-17 LAB — ANA: Anti Nuclear Antibody (ANA): POSITIVE — AB

## 2024-03-17 LAB — C3 AND C4
C3 Complement: 97 mg/dL (ref 83–193)
C4 Complement: 19 mg/dL (ref 15–57)

## 2024-03-17 LAB — ANTI-DNA ANTIBODY, DOUBLE-STRANDED: ds DNA Ab: 1 [IU]/mL

## 2024-03-17 LAB — SEDIMENTATION RATE: Sed Rate: 2 mm/h (ref 0–20)

## 2024-03-17 NOTE — Progress Notes (Signed)
 White cell count remains low at 2.2, lymphocyte count is low due to autoimmune disease and treatment.  ANA remains at high titer, double-stranded ENA negative, urine protein creatinine ratio normal, complements normal, sed rate normal.  SPEP pending.  Labs do not indicate an autoimmune disease flare.

## 2024-03-18 NOTE — Progress Notes (Signed)
 SPEP  normal.

## 2024-05-16 ENCOUNTER — Telehealth: Payer: Self-pay | Admitting: Family

## 2024-05-16 NOTE — Telephone Encounter (Signed)
 LVM to offer for Patient to be seen sooner for symptoms

## 2024-06-07 ENCOUNTER — Ambulatory Visit: Admitting: Family

## 2024-06-07 ENCOUNTER — Other Ambulatory Visit (HOSPITAL_COMMUNITY)
Admission: RE | Admit: 2024-06-07 | Discharge: 2024-06-07 | Disposition: A | Source: Ambulatory Visit | Attending: Family | Admitting: Family

## 2024-06-07 ENCOUNTER — Encounter: Payer: Self-pay | Admitting: Family

## 2024-06-07 VITALS — BP 110/78 | HR 78 | Temp 98.1°F | Ht 61.0 in | Wt 154.4 lb

## 2024-06-07 DIAGNOSIS — Z23 Encounter for immunization: Secondary | ICD-10-CM

## 2024-06-07 DIAGNOSIS — Z1211 Encounter for screening for malignant neoplasm of colon: Secondary | ICD-10-CM

## 2024-06-07 DIAGNOSIS — Z1231 Encounter for screening mammogram for malignant neoplasm of breast: Secondary | ICD-10-CM

## 2024-06-07 DIAGNOSIS — Z0000 Encounter for general adult medical examination without abnormal findings: Secondary | ICD-10-CM

## 2024-06-07 DIAGNOSIS — Z113 Encounter for screening for infections with a predominantly sexual mode of transmission: Secondary | ICD-10-CM

## 2024-06-07 LAB — COMPREHENSIVE METABOLIC PANEL WITH GFR
ALT: 20 U/L (ref 0–35)
AST: 28 U/L (ref 0–37)
Albumin: 4.6 g/dL (ref 3.5–5.2)
Alkaline Phosphatase: 53 U/L (ref 39–117)
BUN: 17 mg/dL (ref 6–23)
CO2: 28 meq/L (ref 19–32)
Calcium: 9.3 mg/dL (ref 8.4–10.5)
Chloride: 102 meq/L (ref 96–112)
Creatinine, Ser: 0.79 mg/dL (ref 0.40–1.20)
GFR: 90.02 mL/min (ref >60.00)
Glucose, Bld: 93 mg/dL (ref 70–99)
Potassium: 3.5 meq/L (ref 3.5–5.1)
Sodium: 136 meq/L (ref 135–145)
Total Bilirubin: 0.6 mg/dL (ref 0.2–1.2)
Total Protein: 7.7 g/dL (ref 6.0–8.3)

## 2024-06-07 LAB — CBC WITH DIFFERENTIAL/PLATELET
Basophils Absolute: 0.1 K/uL (ref 0.0–0.1)
Basophils Relative: 2.3 % (ref 0.0–3.0)
Eosinophils Absolute: 0.1 K/uL (ref 0.0–0.7)
Eosinophils Relative: 2.5 % (ref 0.0–5.0)
HCT: 37.7 % (ref 36.0–46.0)
Hemoglobin: 12.8 g/dL (ref 12.0–15.0)
Lymphocytes Relative: 41.1 % (ref 12.0–46.0)
Lymphs Abs: 1.2 K/uL (ref 0.7–4.0)
MCHC: 33.8 g/dL (ref 30.0–36.0)
MCV: 95 fl (ref 78.0–100.0)
Monocytes Absolute: 0.4 K/uL (ref 0.1–1.0)
Monocytes Relative: 13.6 % — ABNORMAL HIGH (ref 3.0–12.0)
Neutro Abs: 1.2 K/uL — ABNORMAL LOW (ref 1.4–7.7)
Neutrophils Relative %: 40.5 % — ABNORMAL LOW (ref 43.0–77.0)
Platelets: 283 K/uL (ref 150.0–400.0)
RBC: 3.97 Mil/uL (ref 3.87–5.11)
RDW: 14.8 % (ref 11.5–15.5)
WBC: 2.9 K/uL — ABNORMAL LOW (ref 4.0–10.5)

## 2024-06-07 LAB — LIPID PANEL
Cholesterol: 179 mg/dL (ref 0–200)
HDL: 80.7 mg/dL (ref >39.00)
LDL Cholesterol: 86 mg/dL (ref 0–99)
NonHDL: 98.2
Total CHOL/HDL Ratio: 2
Triglycerides: 62 mg/dL (ref 0.0–149.0)
VLDL: 12.4 mg/dL (ref 0.0–40.0)

## 2024-06-07 LAB — TSH: TSH: 6.51 u[IU]/mL — ABNORMAL HIGH (ref 0.35–5.50)

## 2024-06-07 NOTE — Progress Notes (Signed)
 Phone [phone]  Subjective:   Patient is a 48 y.o. female presenting for annual physical.    Chief Complaint  Patient presents with   Annual Exam    Non Fasting w/ labs  Discussed the use of AI scribe software for clinical note transcription with the patient, who gave verbal consent to proceed.  History of Present Illness [name] is a 48 year old female who presents for an annual physical exam with concerns about cholesterol and nausea.  She is worried about her cholesterol and has intermittent nausea, sometimes with headaches. Eating bananas helps the headaches. Coke worsens her symptoms. She is followed by rheumatology for her sjogren's and takes hydroxychloroquine  (Plaquenil ). She has occasional constipation controlled with two magnesium citrate pills daily. She notes elevated blood pressure readings at home on her monitor and links some headaches to these high readings. She drinks alcohol on weekends, about five beers and sometimes wine. She does not smoke or use THC. She exercises 3 to 4 days a week with cardio and weights. She has three children and no partner. She has not had a colonoscopy. Her menses are regular but sometimes heavy. She has had a Pap smear within the past 5 years.  See problem oriented charting- ROS- full  review of systems was completed and negative except for what is noted in HPI above.  The following were reviewed and entered/updated in epic: Past Medical History:  Diagnosis Date   Abnormal uterine bleeding    Epigastric pain 10/18/2021   History of chicken pox    Pain in joint of left shoulder 06/25/2022   Sjogren's disease    Urinary tract infection 12/2014   Vaginal Pap smear, abnormal    Vertigo 10/18/2021   Patient Active Problem List   Diagnosis Date Noted   Intramural leiomyoma of uterus 05/07/2023   Abnormal uterine bleeding (AUB) 04/28/2023   Hot flashes 04/28/2023   Osteoarthritis 04/01/2023   Inflammatory  neuropathy 06/07/2021   Perimenopausal symptoms 05/17/2021   Popliteal cyst, left 12/13/2019   Depression, major, single episode, moderate (HCC) 07/25/2019   Sjogren's disease 03/25/2018   Neutropenia 01/04/2018   Dysplasia of cervix, high grade CIN 2 12/07/2014   Past Surgical History:  Procedure Laterality Date   CESAREAN SECTION     LEEP      Family History  Problem Relation Age of Onset   Diabetes Mother    Hypertension Mother    High Cholesterol Mother    Healthy Sister    Healthy Brother    Healthy Brother    Breast cancer Paternal Grandmother    Healthy Son    Healthy Son    Healthy Son    Medications- reviewed and updated Current Outpatient Medications  Medication Sig Dispense Refill   hydroxychloroquine  (PLAQUENIL ) 200 MG tablet Take 1 tablet (200 mg total) by mouth daily. 90 tablet 0   magnesium oxide (MAG-OX) 400 (240 Mg) MG tablet Take 400 mg by mouth 2 (two) times daily.     Multiple Vitamin (MULTIVITAMIN) tablet Take 1 tablet by mouth daily.     vitamin C (ASCORBIC ACID) 500 MG tablet Take 500 mg by mouth daily.     No current facility-administered medications for this visit.   Allergies-reviewed and updated No Known Allergies  Social History   Social History Narrative   Not on file   Objective:  BP 110/78 (BP Location: Left Arm, Patient Position: Sitting, Cuff Size: Large)   Pulse 78   Temp 98.1 F (36.7  C) (Temporal)   Ht 5' 1 (1.549 m)   Wt 154 lb 6 oz (70 kg)   LMP 06/01/2024 (Approximate)   SpO2 98%   BMI 29.17 kg/m  Physical Exam Vitals and nursing note reviewed.  Constitutional:      Appearance: Normal appearance.  HENT:     Head: Normocephalic.     Right Ear: Tympanic membrane normal.     Left Ear: Tympanic membrane normal.     Nose: Nose normal.     Mouth/Throat:     Mouth: Mucous membranes are moist.  Eyes:     Pupils: Pupils are equal, round, and reactive to light.  Cardiovascular:     Rate and Rhythm: Normal rate and  regular rhythm.  Pulmonary:     Effort: Pulmonary effort is normal.     Breath sounds: Normal breath sounds.  Musculoskeletal:        General: Normal range of motion.     Cervical back: Normal range of motion.  Lymphadenopathy:     Cervical: No cervical adenopathy.  Skin:    General: Skin is warm and dry.  Neurological:     Mental Status: She is alert.  Psychiatric:        Mood and Affect: Mood normal.        Behavior: Behavior normal.     Assessment and Plan   Health Maintenance counseling: 1. Anticipatory guidance: Patient counseled regarding regular dental exams q6 months, eye exams,  avoiding smoking and second hand smoke, limiting alcohol to 1 beverage per day, no illicit drugs.   2. Risk factor reduction:  Advised patient of need for regular exercise and diet rich with fruits and vegetables to reduce risk of heart attack and stroke.  Wt Readings from Last 3 Encounters:  06/07/24 154 lb 6 oz (70 kg)  03/15/24 149 lb 3.2 oz (67.7 kg)  09/29/23 161 lb 6.4 oz (73.2 kg)   3. Immunizations/screenings/ancillary studies Immunization History  Administered Date(s) Administered   Influenza, Seasonal, Injecte, Preservative Fre 04/01/2023, 06/07/2024   Influenza,inj,Quad PF,6+ Mos 06/24/2018, 04/08/2019, 07/25/2020, 05/17/2021, 06/13/2022   PFIZER(Purple Top)SARS-COV-2 Vaccination 07/31/2020, 08/21/2020   PNEUMOCOCCAL CONJUGATE-20 06/07/2024   Pneumococcal Polysaccharide-23 03/25/2018   Tdap 11/23/2014   Health Maintenance Due  Topic Date Due   Hepatitis B Vaccines 19-59 Average Risk (1 of 3 - 19+ 3-dose series) Never done   HPV VACCINES (1 - 3-dose SCDM series) Never done   Mammogram  10/04/2020   Colonoscopy  Never done    4. Cervical cancer screening- done 2022 5. Breast cancer screening-  mammogram ordered today 6. Colon cancer screening - ordering today 7. Skin cancer screening- advised regular sunscreen use. Denies worrisome, changing, or new skin lesions.  8. Birth  control/STD check- none, ordering STI testing today 9. Osteoporosis screening- N/A 10. Alcohol screening: 4-5 beers/week 11. Smoking associated screening (lung cancer screening, AAA screen 65-75, UA)-  non- smoker- cigaros  12. Exercise- 3-4d/week  Assessment & Plan Elevated blood pressure Intermittent elevated blood pressure at home with possible headache correlation. Today's reading is perfect. - Monitor blood pressure at home, target <135/90 mmHg. - Report consistently high readings.  Constipation Chronic constipation managed with magnesium citrate, effective for regularity. - Continue magnesium oxide twice daily.  General Health Maintenance Discussed routine health maintenance, screenings, and lifestyle changes. Advised on alcohol and smoking cessation. - Ordered mammogram. - Ordered colonoscopy. - Administered flu shot and PNA vaccine. - Advised minimal alcohol consumption. - Advised complete smoking cessation.  Annual Physical Routine annual physical with no acute concerns other than occasional nausea with headaches and occasional constipation. - Order full CPE lab panel - Encourage healthy diet and continued exercise.   Recommended follow up:  Return for any future concerns, Complete physical w/fasting labs. Future Appointments  Date Time Provider Department Center  08/15/2024  9:30 AM Cheryl Waddell HERO, PA-C [facility] None   Lab/Order associations: not-fasting   Lucius Krabbe, NP

## 2024-06-07 NOTE — Patient Instructions (Addendum)
  Qu gusto verte hoy!  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  Avsame si obtienes una presin arterial alta: >135/90, cualquiera [employer]. El objetivo es 110-130/60-85.  Que tengas una feliz [place]!  NOTA:  Si te hiciste alguna prueba de laboratorio, avsanos si no has recibido [employer]. Es posible que veas tus resultados en MyChart antes de que podamos revisarlos, biomedical engineer te llamaremos una vez que los [place]. Si solicitamos alguna derivacin hoy, avsanos si no has tenido noticias de su consultorio en la prxima semana.

## 2024-06-08 LAB — URINE CYTOLOGY ANCILLARY ONLY
Chlamydia: NEGATIVE
Comment: NEGATIVE
Comment: NEGATIVE
Comment: NORMAL
Neisseria Gonorrhea: NEGATIVE
Trichomonas: NEGATIVE

## 2024-06-10 ENCOUNTER — Ambulatory Visit: Payer: Self-pay | Admitting: Family

## 2024-06-10 NOTE — Progress Notes (Signed)
 Labs all normal, blood count off due to autoimmune disease and treatment. TSH just slightly high, not concerning.

## 2024-06-17 ENCOUNTER — Encounter: Admitting: Family

## 2024-08-01 NOTE — Progress Notes (Unsigned)
 "  Office Visit Note  Patient: [name]             Date of Birth: 04/20/1976           MRN: [id number]             PCP: Lucius Krabbe, NP Referring: Lucius Krabbe, NP Visit Date: 08/15/2024 Occupation: Data Unavailable  Subjective:  No chief complaint on file.   History of Present Illness: [name] is a 49 y.o. female ***     Activities of Daily Living:  Patient reports morning stiffness for *** {minute/hour:19697}.   Patient {ACTIONS;DENIES/REPORTS:21021675::Denies} nocturnal pain.  Difficulty dressing/grooming: {ACTIONS;DENIES/REPORTS:21021675::Denies} Difficulty climbing stairs: {ACTIONS;DENIES/REPORTS:21021675::Denies} Difficulty getting out of chair: {ACTIONS;DENIES/REPORTS:21021675::Denies} Difficulty using hands for taps, buttons, cutlery, and/or writing: {ACTIONS;DENIES/REPORTS:21021675::Denies}  No Rheumatology ROS completed.   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    Past Medical History:  Diagnosis Date   Abnormal uterine bleeding    Epigastric pain 10/18/2021   History of chicken pox    Pain in joint of left shoulder 06/25/2022   Sjogren's disease    Urinary tract infection 12/2014   Vaginal Pap smear, abnormal    Vertigo 10/18/2021    Family History  Problem Relation Age of Onset   Diabetes Mother    Hypertension Mother    High Cholesterol Mother    Healthy Sister    Healthy Brother    Healthy Brother    Breast cancer Paternal Grandmother    Healthy  Son    Healthy Son    Healthy Son    Past Surgical History:  Procedure Laterality Date   CESAREAN SECTION     LEEP     Social History[1] Social History   Social History Narrative   Not on file     Immunization History  Administered Date(s) Administered   Influenza, Seasonal, Injecte, Preservative Fre 04/01/2023, 06/07/2024   Influenza,inj,Quad PF,6+ Mos 06/24/2018, 04/08/2019, 07/25/2020, 05/17/2021, 06/13/2022   PFIZER(Purple Top)SARS-COV-2 Vaccination 07/31/2020, 08/21/2020   PNEUMOCOCCAL CONJUGATE-20 06/07/2024   Pneumococcal Polysaccharide-23 03/25/2018   Tdap 11/23/2014     Objective: Vital Signs: There were no vitals taken for this visit.   Physical Exam   Musculoskeletal Exam: ***  CDAI Exam: CDAI Score: -- Patient Global: --; Provider Global: -- Swollen: --; Tender: -- Joint Exam 08/15/2024   No joint exam has been documented for this visit   There is currently no information documented on the homunculus. Go to the Rheumatology activity and complete the homunculus joint exam.  Investigation: No additional findings.  Imaging: No results found.  Recent Labs: Lab Results  Component Value Date   WBC 2.9 (L) 06/07/2024   HGB 12.8 06/07/2024   PLT 283.0 06/07/2024   NA 136 06/07/2024   K 3.5 06/07/2024   CL 102 06/07/2024   CO2 28 06/07/2024   GLUCOSE 93 06/07/2024   BUN 17 06/07/2024   CREATININE 0.79 06/07/2024   BILITOT 0.6 06/07/2024   ALKPHOS 53 06/07/2024   AST 28 06/07/2024   ALT 20 06/07/2024   PROT 7.7 06/07/2024   ALBUMIN  4.6 06/07/2024   CALCIUM 9.3 06/07/2024   GFRAA 125 07/11/2020    Speciality Comments: PLQ Eye Exam: 09/17/2023 WNL @ [facility] Follow up in 1 year  Procedures:  No procedures performed Allergies: Patient has no known allergies.   Assessment / Plan:     Visit Diagnoses: Sjogren's syndrome with other organ involvement  High risk medication use  History of neutropenia  Primary osteoarthritis  of both knees  Lateral epicondylitis, right elbow  Primary osteoarthritis of both feet  Chronic SI joint pain  Other fatigue  Cervicalgia  Language barrier  Orders: No orders of the defined types were placed in this encounter.  No orders of the defined types were placed in this encounter.   Face-to-face time spent with patient was *** minutes. Greater than 50% of time was spent in counseling and coordination of care.  Follow-Up Instructions: No follow-ups on file.   Waddell CHRISTELLA Craze, PA-C  Note - This record has been created using Dragon software.  Chart creation errors have been sought, but may not always  have been located. Such creation errors do not reflect on  the standard of medical care.     [1]  Social History Tobacco Use   Smoking status: Some Days    Types: Cigarettes, Cigars    Passive exposure: Past   Smokeless tobacco: Never  Vaping Use   Vaping status: Never Used  Substance Use Topics   Alcohol use: Yes    Comment: occ   Drug use: No   "

## 2024-08-15 ENCOUNTER — Ambulatory Visit: Admitting: Physician Assistant

## 2024-08-15 DIAGNOSIS — M170 Bilateral primary osteoarthritis of knee: Secondary | ICD-10-CM

## 2024-08-15 DIAGNOSIS — M19071 Primary osteoarthritis, right ankle and foot: Secondary | ICD-10-CM

## 2024-08-15 DIAGNOSIS — Z603 Acculturation difficulty: Secondary | ICD-10-CM

## 2024-08-15 DIAGNOSIS — Z862 Personal history of diseases of the blood and blood-forming organs and certain disorders involving the immune mechanism: Secondary | ICD-10-CM

## 2024-08-15 DIAGNOSIS — G8929 Other chronic pain: Secondary | ICD-10-CM

## 2024-08-15 DIAGNOSIS — M3509 Sicca syndrome with other organ involvement: Secondary | ICD-10-CM

## 2024-08-15 DIAGNOSIS — R5383 Other fatigue: Secondary | ICD-10-CM

## 2024-08-15 DIAGNOSIS — M7711 Lateral epicondylitis, right elbow: Secondary | ICD-10-CM

## 2024-08-15 DIAGNOSIS — Z79899 Other long term (current) drug therapy: Secondary | ICD-10-CM

## 2024-08-15 DIAGNOSIS — M542 Cervicalgia: Secondary | ICD-10-CM
